# Patient Record
Sex: Female | Born: 1935
Health system: Southern US, Community
[De-identification: ages and names within clinical notes are randomized; demographics above are authoritative.]

## PROBLEM LIST (undated history)

## (undated) DIAGNOSIS — F419 Anxiety disorder, unspecified: Secondary | ICD-10-CM

## (undated) DIAGNOSIS — F32A Depression, unspecified: Secondary | ICD-10-CM

## (undated) DIAGNOSIS — E785 Hyperlipidemia, unspecified: Secondary | ICD-10-CM

## (undated) DIAGNOSIS — N189 Chronic kidney disease, unspecified: Secondary | ICD-10-CM

## (undated) DIAGNOSIS — M810 Age-related osteoporosis without current pathological fracture: Secondary | ICD-10-CM

## (undated) DIAGNOSIS — I10 Essential (primary) hypertension: Secondary | ICD-10-CM

## (undated) DIAGNOSIS — N390 Urinary tract infection, site not specified: Secondary | ICD-10-CM

## (undated) DIAGNOSIS — M1990 Unspecified osteoarthritis, unspecified site: Secondary | ICD-10-CM

## (undated) DIAGNOSIS — K219 Gastro-esophageal reflux disease without esophagitis: Secondary | ICD-10-CM

## (undated) DIAGNOSIS — I639 Cerebral infarction, unspecified: Secondary | ICD-10-CM

## (undated) DIAGNOSIS — F329 Major depressive disorder, single episode, unspecified: Secondary | ICD-10-CM

## (undated) HISTORY — DX: Anxiety disorder, unspecified: F41.9

## (undated) HISTORY — PX: CHOLECYSTECTOMY: SHX55

## (undated) HISTORY — DX: Chronic kidney disease, unspecified: N18.9

## (undated) HISTORY — DX: Age-related osteoporosis without current pathological fracture: M81.0

## (undated) HISTORY — DX: Unspecified osteoarthritis, unspecified site: M19.90

## (undated) HISTORY — DX: Depression, unspecified: F32.A

## (undated) HISTORY — DX: Major depressive disorder, single episode, unspecified: F32.9

---

## 1997-07-29 ENCOUNTER — Ambulatory Visit (HOSPITAL_COMMUNITY): Admission: RE | Admit: 1997-07-29 | Discharge: 1997-07-29 | Payer: Self-pay | Admitting: Ophthalmology

## 1997-08-13 ENCOUNTER — Other Ambulatory Visit: Admission: RE | Admit: 1997-08-13 | Discharge: 1997-08-13 | Payer: Self-pay | Admitting: Internal Medicine

## 2003-04-12 ENCOUNTER — Inpatient Hospital Stay (HOSPITAL_COMMUNITY): Admission: EM | Admit: 2003-04-12 | Discharge: 2003-04-17 | Payer: Self-pay | Admitting: Emergency Medicine

## 2003-04-14 ENCOUNTER — Encounter (INDEPENDENT_AMBULATORY_CARE_PROVIDER_SITE_OTHER): Payer: Self-pay | Admitting: Cardiology

## 2003-04-17 ENCOUNTER — Inpatient Hospital Stay
Admission: RE | Admit: 2003-04-17 | Discharge: 2003-04-29 | Payer: Self-pay | Admitting: Physical Medicine & Rehabilitation

## 2003-05-27 ENCOUNTER — Encounter
Admission: RE | Admit: 2003-05-27 | Discharge: 2003-08-25 | Payer: Self-pay | Admitting: Physical Medicine & Rehabilitation

## 2003-10-24 ENCOUNTER — Emergency Department (HOSPITAL_COMMUNITY): Admission: EM | Admit: 2003-10-24 | Discharge: 2003-10-25 | Payer: Self-pay | Admitting: *Deleted

## 2003-10-26 ENCOUNTER — Inpatient Hospital Stay (HOSPITAL_COMMUNITY): Admission: RE | Admit: 2003-10-26 | Discharge: 2003-10-31 | Payer: Self-pay | Admitting: Family Medicine

## 2004-12-29 ENCOUNTER — Ambulatory Visit (HOSPITAL_COMMUNITY): Admission: RE | Admit: 2004-12-29 | Discharge: 2004-12-29 | Payer: Self-pay | Admitting: Family Medicine

## 2006-01-05 ENCOUNTER — Ambulatory Visit (HOSPITAL_COMMUNITY): Admission: RE | Admit: 2006-01-05 | Discharge: 2006-01-05 | Payer: Self-pay | Admitting: Family Medicine

## 2006-01-23 ENCOUNTER — Ambulatory Visit (HOSPITAL_COMMUNITY): Admission: RE | Admit: 2006-01-23 | Discharge: 2006-01-23 | Payer: Self-pay | Admitting: General Surgery

## 2006-07-08 ENCOUNTER — Emergency Department (HOSPITAL_COMMUNITY): Admission: EM | Admit: 2006-07-08 | Discharge: 2006-07-08 | Payer: Self-pay | Admitting: Emergency Medicine

## 2006-10-17 ENCOUNTER — Emergency Department (HOSPITAL_COMMUNITY): Admission: EM | Admit: 2006-10-17 | Discharge: 2006-10-17 | Payer: Self-pay | Admitting: Emergency Medicine

## 2006-10-19 ENCOUNTER — Inpatient Hospital Stay (HOSPITAL_COMMUNITY): Admission: RE | Admit: 2006-10-19 | Discharge: 2006-10-31 | Payer: Self-pay | Admitting: Gastroenterology

## 2006-10-19 ENCOUNTER — Ambulatory Visit: Payer: Self-pay | Admitting: Urgent Care

## 2006-10-22 ENCOUNTER — Ambulatory Visit: Payer: Self-pay | Admitting: Internal Medicine

## 2006-11-14 ENCOUNTER — Ambulatory Visit (HOSPITAL_COMMUNITY): Admission: RE | Admit: 2006-11-14 | Discharge: 2006-11-14 | Payer: Self-pay | Admitting: Internal Medicine

## 2006-12-25 ENCOUNTER — Ambulatory Visit: Payer: Self-pay | Admitting: Gastroenterology

## 2007-04-12 ENCOUNTER — Ambulatory Visit: Payer: Self-pay | Admitting: Gastroenterology

## 2007-07-14 ENCOUNTER — Inpatient Hospital Stay (HOSPITAL_COMMUNITY): Admission: EM | Admit: 2007-07-14 | Discharge: 2007-07-17 | Payer: Self-pay | Admitting: Emergency Medicine

## 2007-08-02 ENCOUNTER — Inpatient Hospital Stay (HOSPITAL_COMMUNITY): Admission: EM | Admit: 2007-08-02 | Discharge: 2007-08-06 | Payer: Self-pay | Admitting: Emergency Medicine

## 2007-08-15 ENCOUNTER — Ambulatory Visit: Payer: Self-pay | Admitting: Internal Medicine

## 2007-08-26 ENCOUNTER — Ambulatory Visit: Payer: Self-pay | Admitting: Gastroenterology

## 2007-08-26 ENCOUNTER — Ambulatory Visit (HOSPITAL_COMMUNITY): Admission: RE | Admit: 2007-08-26 | Discharge: 2007-08-26 | Payer: Self-pay | Admitting: Gastroenterology

## 2007-08-26 ENCOUNTER — Encounter: Payer: Self-pay | Admitting: Gastroenterology

## 2007-09-04 ENCOUNTER — Ambulatory Visit: Payer: Self-pay | Admitting: Gastroenterology

## 2007-10-02 ENCOUNTER — Inpatient Hospital Stay (HOSPITAL_COMMUNITY): Admission: EM | Admit: 2007-10-02 | Discharge: 2007-10-11 | Payer: Self-pay | Admitting: Emergency Medicine

## 2007-10-03 ENCOUNTER — Ambulatory Visit: Payer: Self-pay | Admitting: Oncology

## 2007-10-04 ENCOUNTER — Ambulatory Visit: Payer: Self-pay | Admitting: Gastroenterology

## 2007-10-05 ENCOUNTER — Ambulatory Visit: Payer: Self-pay | Admitting: Gastroenterology

## 2007-10-07 ENCOUNTER — Ambulatory Visit: Payer: Self-pay | Admitting: Internal Medicine

## 2007-10-09 ENCOUNTER — Ambulatory Visit: Payer: Self-pay | Admitting: Gastroenterology

## 2007-10-18 ENCOUNTER — Ambulatory Visit (HOSPITAL_COMMUNITY): Admission: RE | Admit: 2007-10-18 | Discharge: 2007-10-18 | Payer: Self-pay | Admitting: Family Medicine

## 2007-11-20 ENCOUNTER — Ambulatory Visit: Payer: Self-pay | Admitting: Gastroenterology

## 2008-03-03 ENCOUNTER — Ambulatory Visit: Payer: Self-pay | Admitting: Gastroenterology

## 2008-03-03 LAB — CONVERTED CEMR LAB
ALT: 24 units/L (ref 0–35)
AST: 26 units/L (ref 0–37)
Albumin: 4.1 g/dL (ref 3.5–5.2)
Alkaline Phosphatase: 89 units/L (ref 39–117)
Bilirubin, Direct: 0.1 mg/dL (ref 0.0–0.3)
Ferritin: 220 ng/mL (ref 10–291)
HCT: 45.4 % (ref 36.0–46.0)
Hemoglobin: 14 g/dL (ref 12.0–15.0)
Indirect Bilirubin: 0.3 mg/dL (ref 0.0–0.9)
Total Bilirubin: 0.4 mg/dL (ref 0.3–1.2)
Total Protein: 7.5 g/dL (ref 6.0–8.3)

## 2008-06-29 IMAGING — CR DG CHEST 2V
2 series · 2 of 2 positions shown · non-contrast
Comparison: 10/21/2006.

Exam: Chest, 2 views.

HISTORY: Acute renal failure and pneumonia.

[view not recorded (1 of 2)]
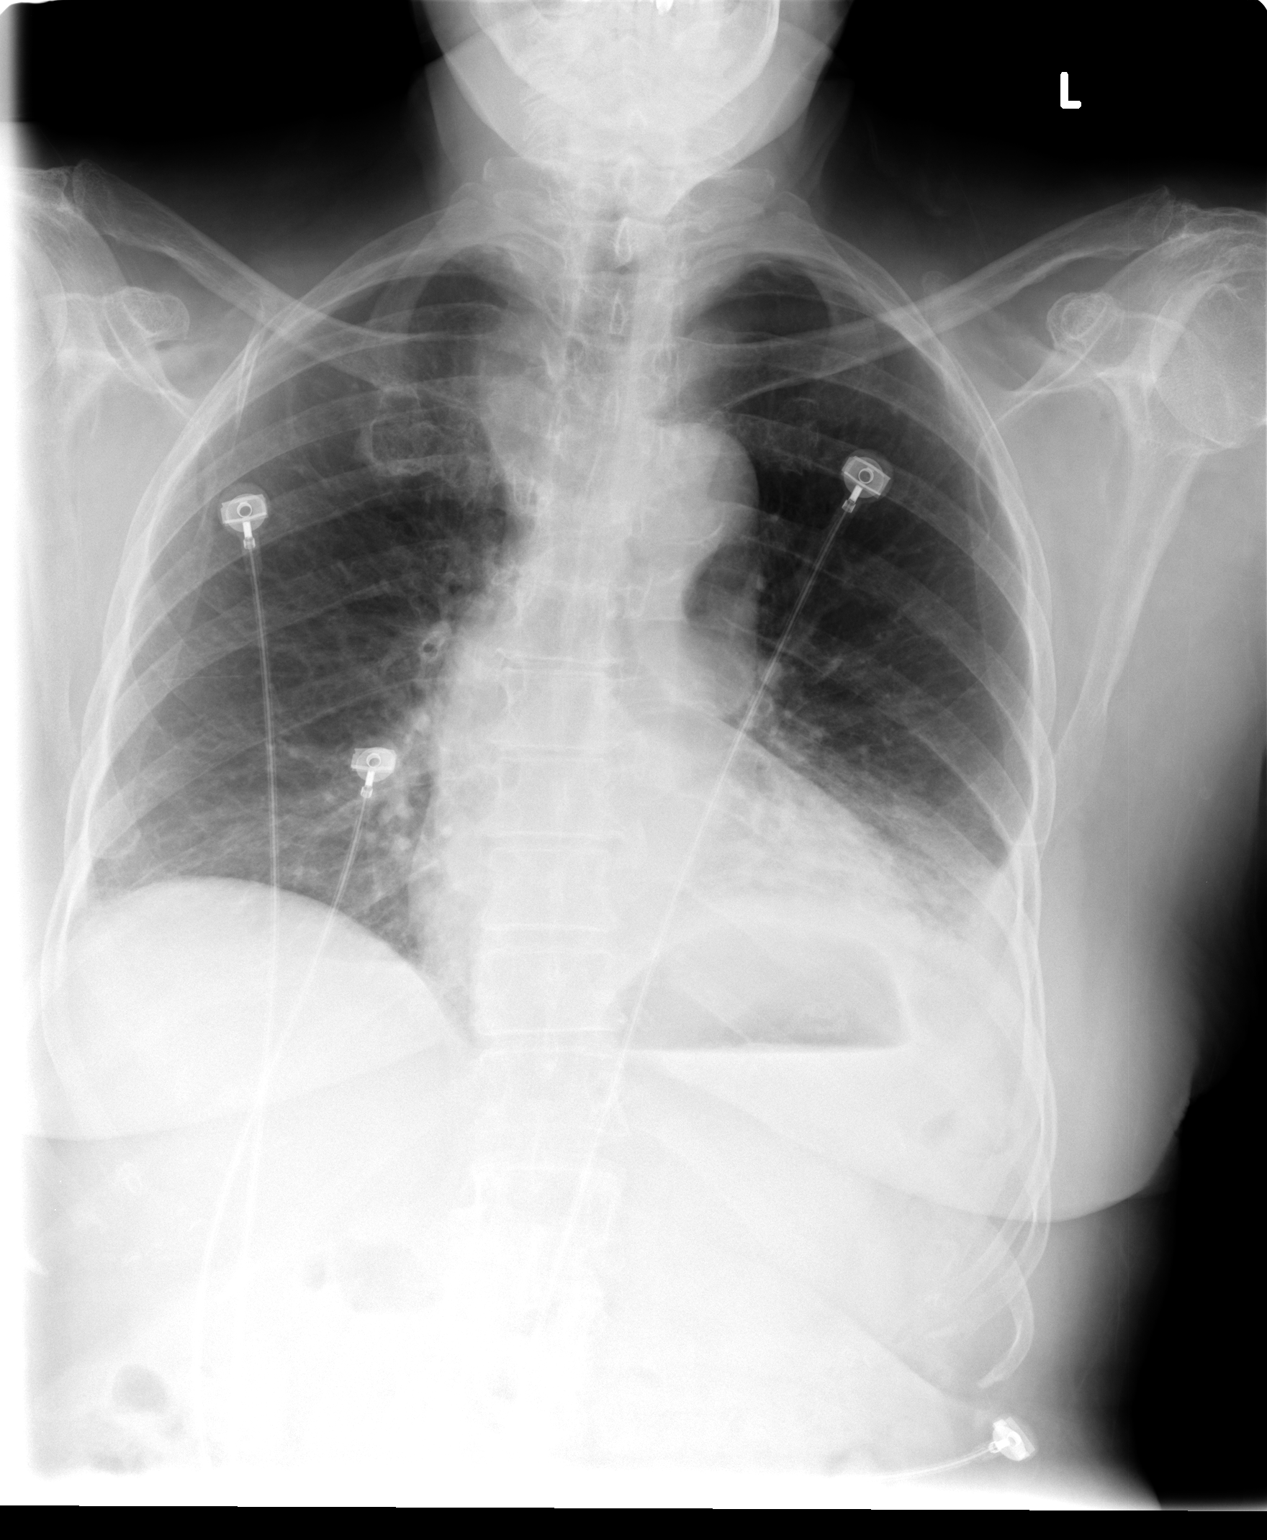

[view not recorded (2 of 2)]
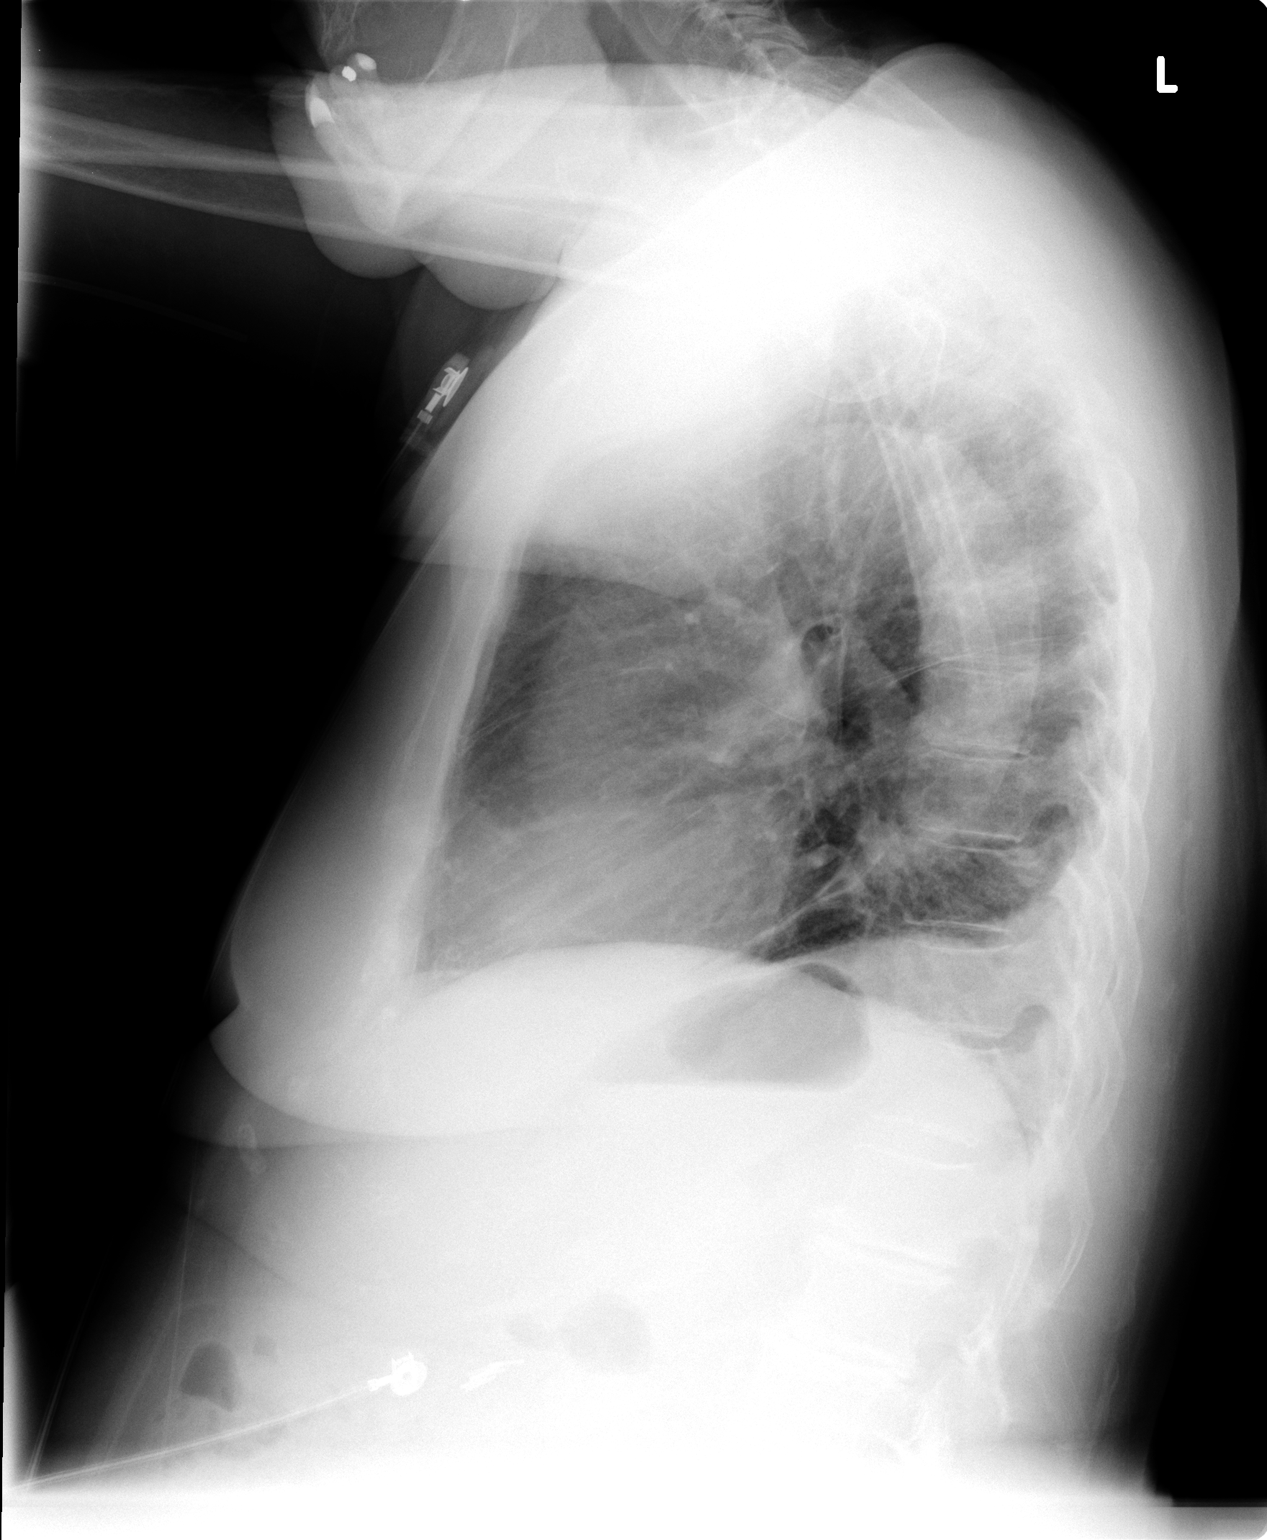

[2 of 2 positions shown; findings below may reference images not displayed]

FINDINGS: Heart size is mildly enlarged. 

There is a small left pleural effusion, unchanged from prior exam.

Left lower lobe airspace disease is similar to prior exam.
IMPRESSION: 1. No change in left lower lobe air space disease and subpulmonic effusion.

## 2008-10-23 DIAGNOSIS — Z862 Personal history of diseases of the blood and blood-forming organs and certain disorders involving the immune mechanism: Secondary | ICD-10-CM | POA: Insufficient documentation

## 2008-10-23 DIAGNOSIS — N189 Chronic kidney disease, unspecified: Secondary | ICD-10-CM | POA: Insufficient documentation

## 2008-10-23 DIAGNOSIS — K869 Disease of pancreas, unspecified: Secondary | ICD-10-CM | POA: Insufficient documentation

## 2008-10-23 DIAGNOSIS — D649 Anemia, unspecified: Secondary | ICD-10-CM | POA: Insufficient documentation

## 2008-10-23 DIAGNOSIS — I10 Essential (primary) hypertension: Secondary | ICD-10-CM | POA: Insufficient documentation

## 2008-10-23 DIAGNOSIS — R809 Proteinuria, unspecified: Secondary | ICD-10-CM | POA: Insufficient documentation

## 2008-10-23 DIAGNOSIS — E213 Hyperparathyroidism, unspecified: Secondary | ICD-10-CM | POA: Insufficient documentation

## 2008-10-23 DIAGNOSIS — Z8639 Personal history of other endocrine, nutritional and metabolic disease: Secondary | ICD-10-CM | POA: Insufficient documentation

## 2008-10-23 DIAGNOSIS — M1990 Unspecified osteoarthritis, unspecified site: Secondary | ICD-10-CM | POA: Insufficient documentation

## 2009-06-11 IMAGING — CR DG HIP COMPLETE 2+V*R*
3 series · 3 of 3 positions shown · non-contrast
Comparison: None

CLINICAL DATA: Pain, weakness

RIGHT HIP - COMPLETE 2+ VIEW

[view not recorded (1 of 3)]
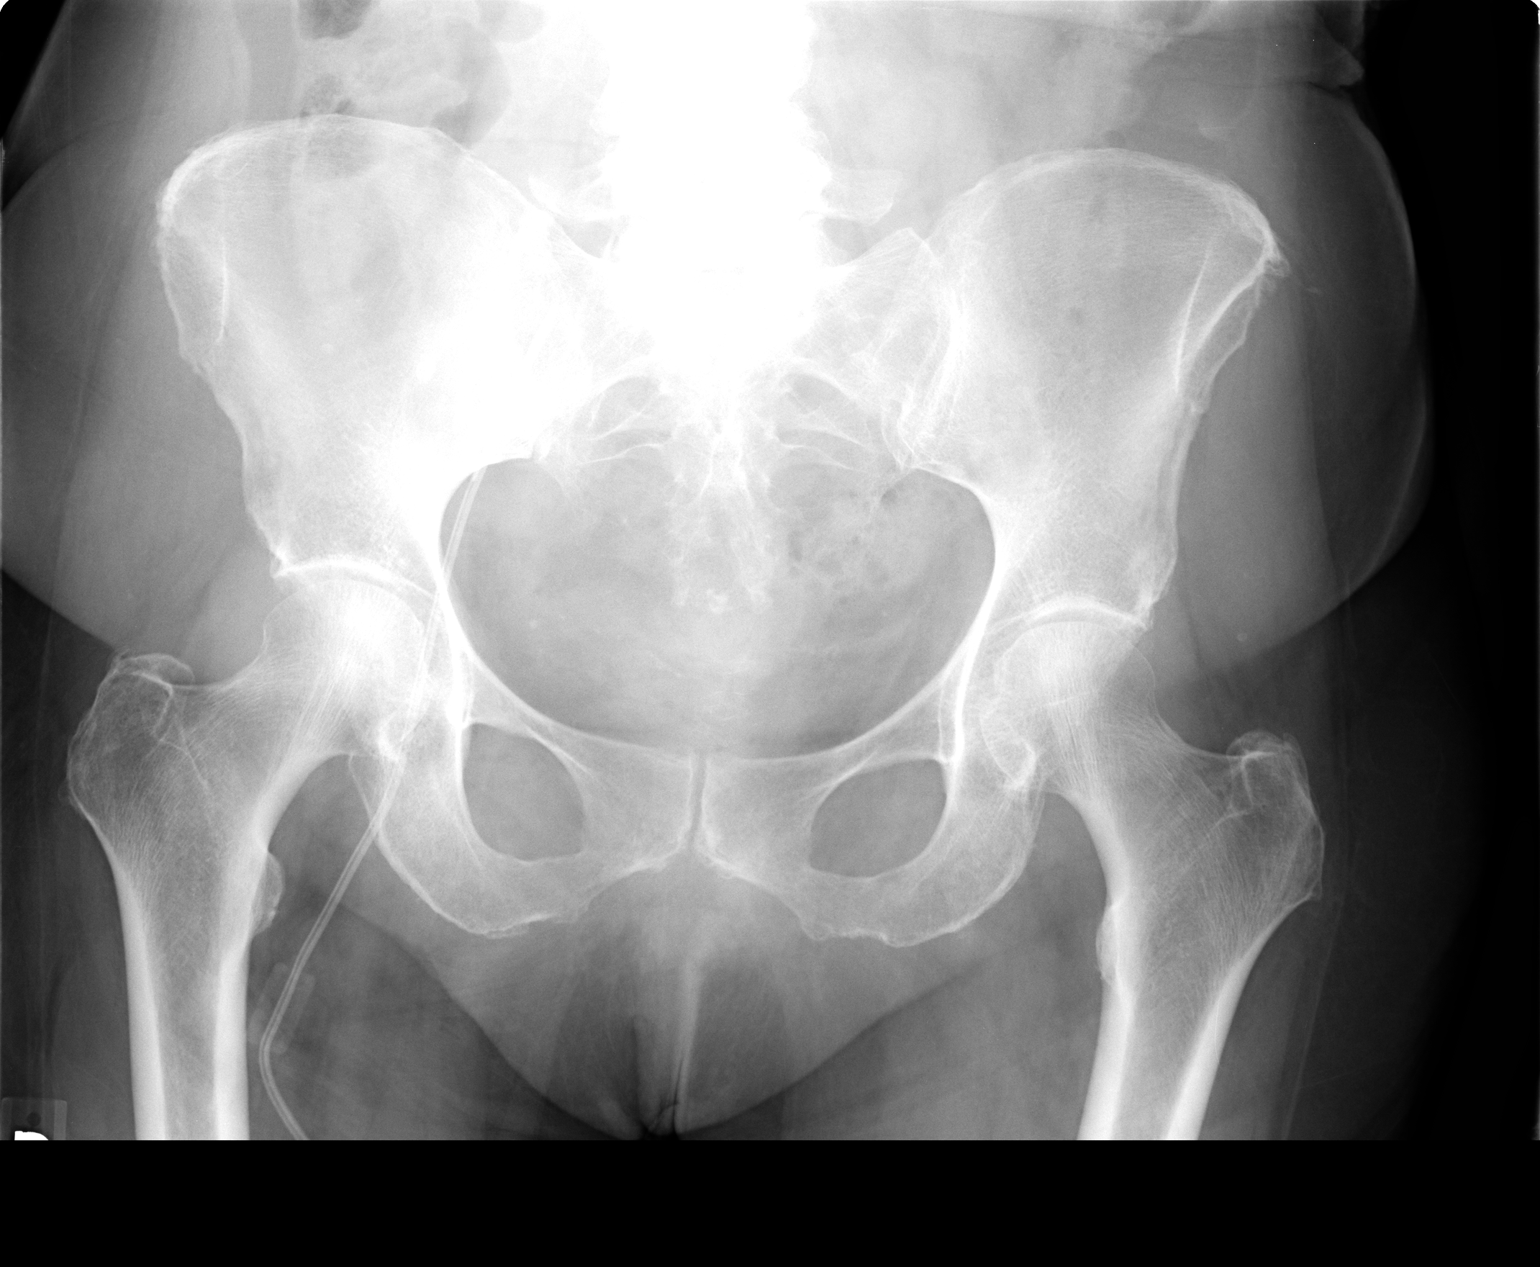

[view not recorded (2 of 3)]
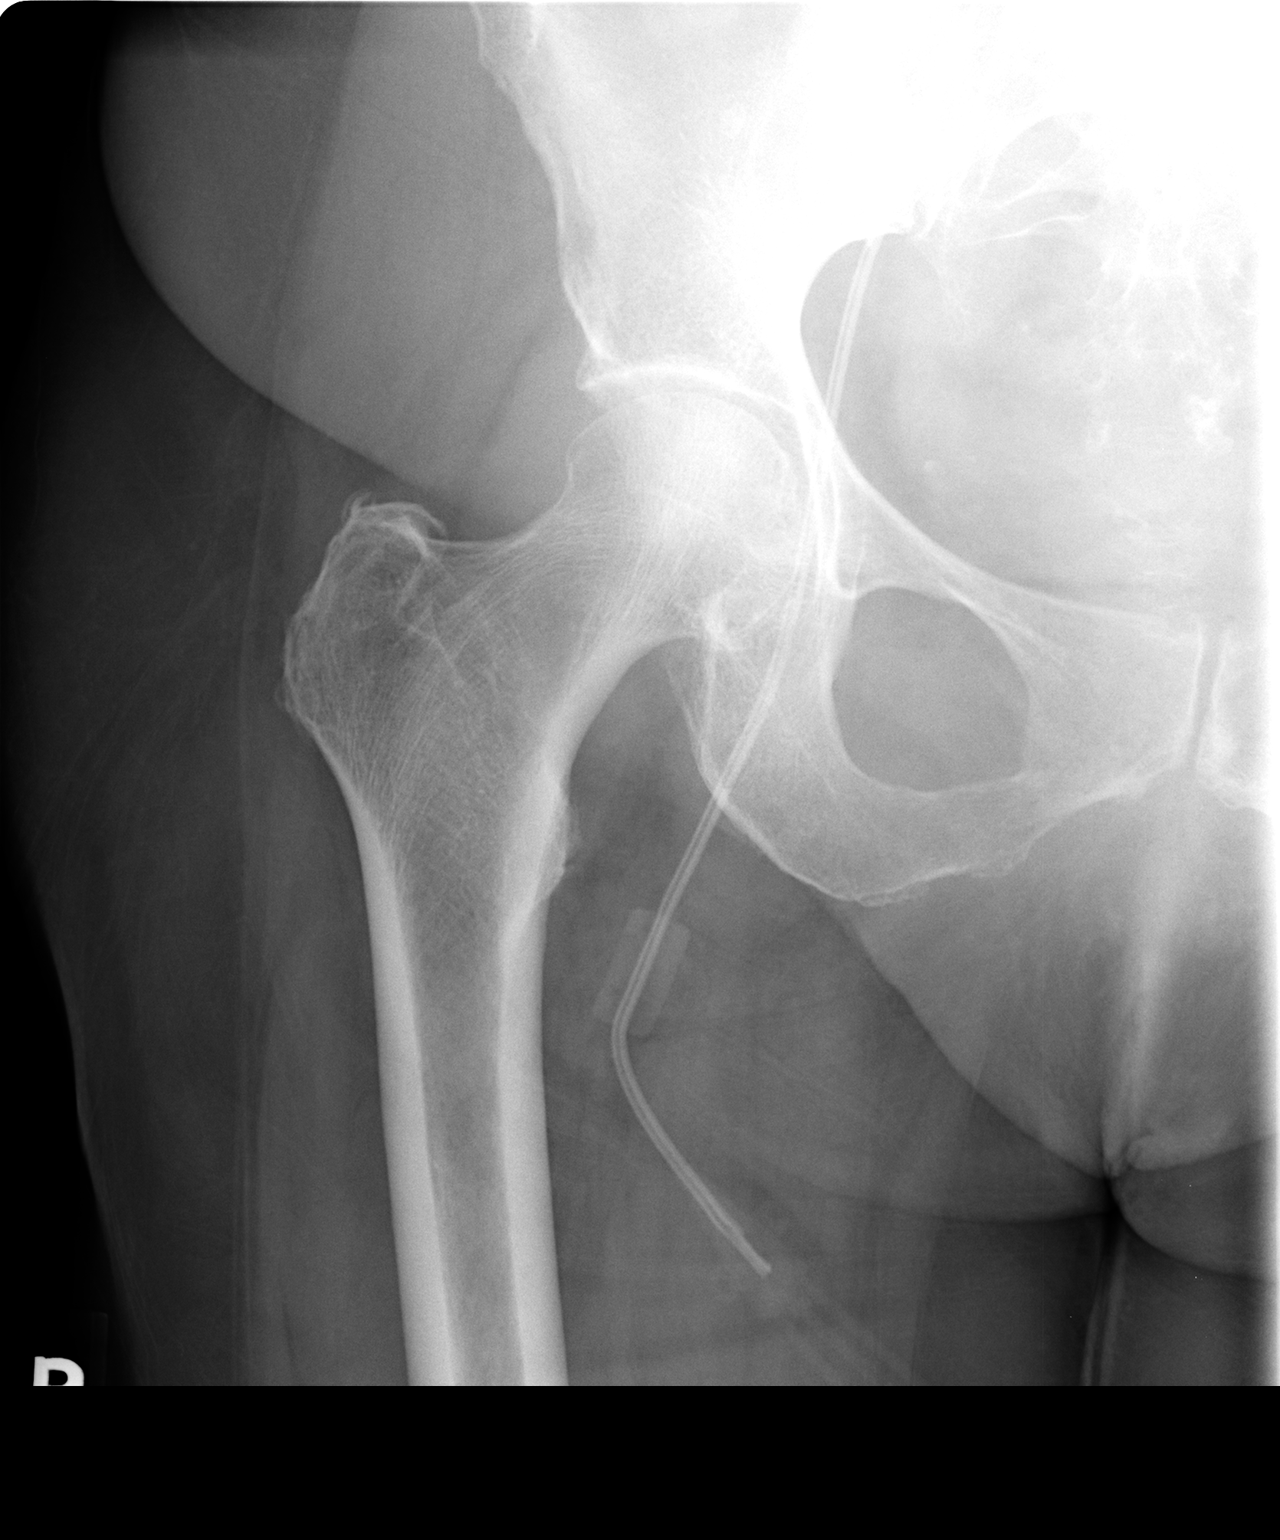

[view not recorded (3 of 3)]
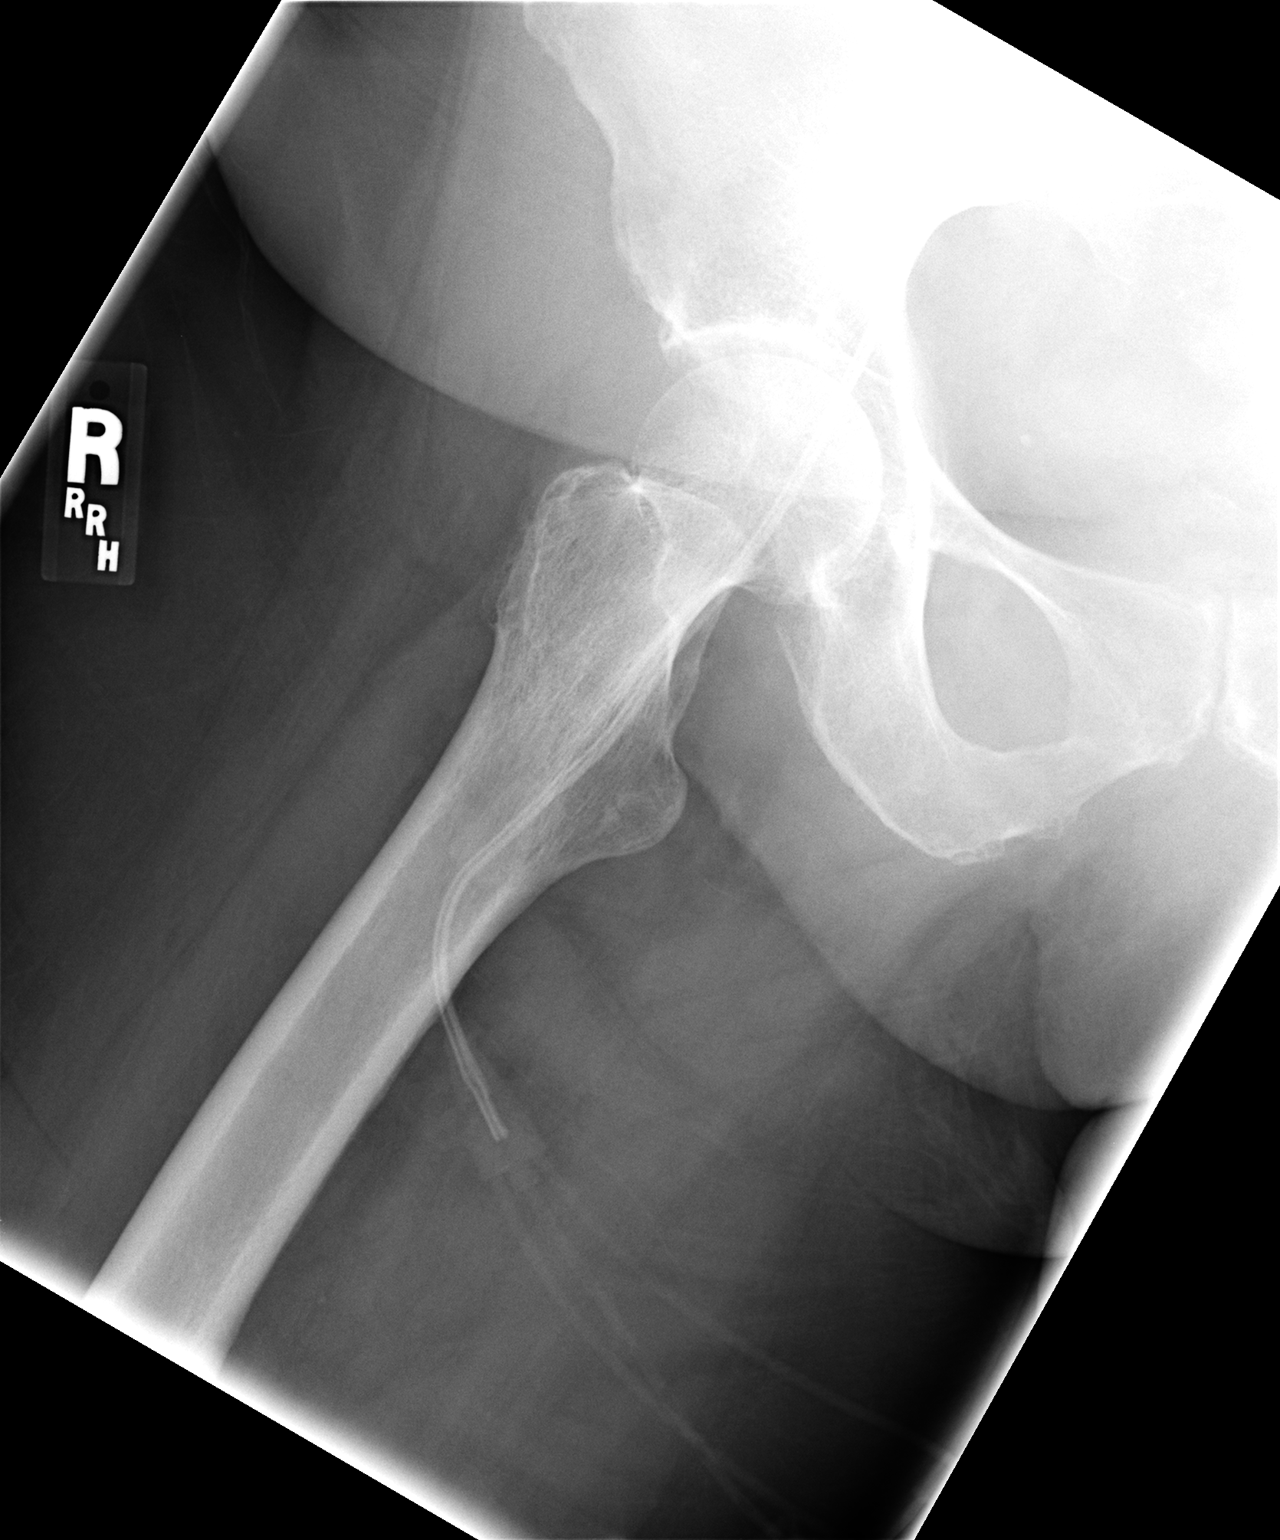

[3 of 3 positions shown; findings below may reference images not displayed]

FINDINGS: Three views of the right hip shows no acute fracture or
subluxation.  Mild degenerative narrowing of superior joint space
noted.  A right femoral/iliac venous catheter is noted.
IMPRESSION: No acute fracture or subluxation.  Mild degenerative narrowing of
superior joint space.

## 2010-08-09 NOTE — Group Therapy Note (Signed)
[name], [name]             ACCOUNT NO.:  [id number]   MEDICAL RECORD NO.:  [id number]          PATIENT TYPE:  INP   LOCATION:  A214                          FACILITY:  [facility]   PHYSICIAN:  Girard Cooter, MD         DATE OF BIRTH:  06-05-1930   DATE OF PROCEDURE:  08/05/2007  DATE OF DISCHARGE:                                 PROGRESS NOTE   Patient examined by bedside today.  She still states that she has no  appetite.  She denies any nausea or vomiting.  I spoke to her daughter  today as well.  It has also been our concern that patient has not been  eating as well.   PHYSICAL EXAMINATION:  VITAL SIGNS:  Temperature 90.4, pulse 82,  respirations 18, blood pressure 121/67, pulse ox 97% on room air.  HEENT:  Normocephalic and atraumatic.  Mucous membranes are moist.  CARDIOVASCULAR:  S1-S2.  Regular rate and rhythm without murmur.  ABDOMEN:  Soft, nontender, nondistended.  Positive bowel sounds.  LUNGS:  Clear to auscultation bilaterally.  No rhonchi, rales or  wheezes.   LABORATORY DATA:  She shows much improved creatinine of 2.20.  Note that  her baseline creatinine is 2.01.  Her albumin is low at 2.4.  AST, ALT,  alk phos within normal limits.  CBC with white count 9.1, hemoglobin low  at 9.4, hematocrit 26.7, platelets is 303.  CBC yesterday shows a  hemoglobin of 10.5, hematocrit 30.3.  MCV is normal at 92.6.  Her anemia  panel shows a retic count of 65.8 within normal limits, iron level low  at 25, TIBC 25.2, vitamin B12 okay.  Alpha-fetoprotein less than 1.3.  CEA 1.  Protein electrophoresis 24.  An interpretation shows results 7.3  within normal limits.   ASSESSMENT/PLAN:  1. Acute on chronic renal failure and nongap metabolic acidosis      resolving slowly, unclear etiology could be secondary to diuretics.      I suspect that there is an underlying reason the patient is also      anemic.  2. Iron-deficient normocytic anemia secondary to chronic disease of      the  disease.  He will go ahead and get stool for occult blood in      case there is a mixed picture.  We will supplement her with iron.      Note that her urinalysis also shows a small amount of blood.      Bladder malignancy is also in the differential diagnosis.  3. Loss of appetite and hypoalbuminemia and protein calorie      malnutrition secondary to unclear etiology.  The patient states      that she just has a decreased appetite and no abdominal symptoms.      Will continue dietary therapy, continue to follow up plans per      nephrology.  4. Urinary tract infection per labs.  We will wait cultures continue      Levaquin.  5. History of depression.  This could obviously also contribute to her  decreased p.o. intake.  We will go ahead and get an ACT team      consult.  6. Continue Lexapro.  Go ahead and increase Lexapro to 10 mg p.o.      daily.   DISPOSITION:  The patient's care is currently ongoing.  Rest of the  plans dependent on her progress.  Nephrology recommendations greatly  appreciated.      Girard Cooter, MD  Electronically Signed     RR/MEDQ  D:  08/05/2007  T:  08/05/2007  Job:  [id number]

## 2010-08-09 NOTE — Group Therapy Note (Signed)
[name], [name]             ACCOUNT NO.:  [id number]   MEDICAL RECORD NO.:  [id number]          PATIENT TYPE:  INP   LOCATION:  A204                          FACILITY:  [facility]   PHYSICIAN:  Anselmo Pickler, DO    DATE OF BIRTH:  06-Feb-1931   DATE OF PROCEDURE:  DATE OF DISCHARGE:                                 PROGRESS NOTE   HISTORY OF PRESENT ILLNESS:  The patient is a 77 year old African-  American female who was seen here regarding renal failure of unknown  etiology. Currently the patient yesterday was complaining of not being  able to swallow. Does not have any complaints of that currently today.  However, a swallow study has been ordered and await the results from  that. Basically, I explained to the patient that we were waiting for her  kidney function to improve and will continue to monitor with that as  well. The patient was also seen by PT/OT at this point in time. She is  currently still on bed rest.   OBJECTIVE:  VITAL SIGNS: Her vitals are as follows: Her temperature is  97.7, pulse 83, respirations 20, blood pressure 96/47.  GENERAL APPEARANCE: Generally, this is a 77 year old African-American  female who is well developed, well nourished and in no acute distress.  HEART: Regular rate and rhythm, S1, S2 noted. No murmurs.  LUNGS: The patient has rales in the right base as well as some wheezing  with inspiration.  ABDOMEN: Soft, nontender, nondistended. Bowel sounds are present. No  masses or organomegaly appreciated.  EXTREMITIES: Positive pulses in all 4 extremities. No edema, cyanosis or  clubbing noted.   LABORATORY DATA:  Her labs that were done today include a CBC with a  white count of 8.4, hemoglobin 10.0, hematocrit 30.1 and platelets of  576. Also she had a chemistry done with a sodium of 135, potassium 3.5,  chloride 98, CO2 21, glucose 99, BUN 82 which has increased from  yesterday at 81 and creatinine of 9.56 which is decreased from 7/29, at  11.58. Phosphorus is 7.6, calcium is 7.8 and blood albumin is 2.5.   ASSESSMENT AND PLAN:  1. Acute renal failure of unclear etiology. The patient is improving.      Will go ahead and just monitor the patient along with nephrology.  2. Anemia from chronic disease. Will continue to monitor the patient      at this point in time.  3. Pneumonia. The patient is on Levaquin. This is day 4. Will continue      this therapy and will discontinue after 5 days.  4. Hypertension which is currently stable. Will still hold antitensive      medications at this point in time until her blood pressure actually      increased.  5. Deep venous thrombosis and gastrointestinal prophylaxis will be      continued.      Anselmo Pickler, DO  Electronically Signed     CB/MEDQ  D:  10/24/2006  T:  10/25/2006  Job:  [id number]

## 2010-08-09 NOTE — Consult Note (Signed)
NAMESADIAH, [name]             ACCOUNT NO.:  [id number]   MEDICAL RECORD NO.:  [id number]          PATIENT TYPE:  INP   LOCATION:  A313                          FACILITY:  [facility]   PHYSICIAN:  Caro Hight, [name]      DATE OF BIRTH:  Oct 09, 1930   DATE OF CONSULTATION:  DATE OF DISCHARGE:                                 CONSULTATION   REQUESTING PHYSICIAN:  Dr. Caron Presume   CHIEF COMPLAINT:  Unable to eat, nausea and vomiting, abdominal  discomfort.   HISTORY OF PRESENT ILLNESS:  [name] is a 77 year old female with  47-month history of being unable to eat.  She states she has severe  nausea.  She has vomited on at least four occasions.  She has lost 10  pounds.  She complains of an intermittent mid abdominal pain.  It  usually lasts all night long per her report.  She is having a daily  bowel movement.  Denies any rectal bleeding or melena.  She denies any  heart burn or indigestion although she has been gagging, especially with  her pills.  Denies any problems with solid or liquid dysphagia  otherwise.  She did have an EKG through Dr. [name] office which was  negative.  She also has been found to be anemic with a hemoglobin of 10,  a hematocrit of 32.3, an MCV of 91.  She had a ferritin of 391, an iron  of 19, and a percent saturation of 7.  She had a normal amylase and  lipase and a negative H. pylori.  White blood cell count was 8.  She had  a normal TSH on October 09, 2006.  She had a comprehensive metabolic panel  on October 09, 2006, and had a normal CMP except an albumin of 3.4.   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  She has a history of  hypertension, depression.  Diverticulosis was found on a colonoscopy by  Dr. Arnoldo Morale, they believe within the last year.  She is status post  gallstone pancreatitis and cholecystectomy in 2005.   CURRENT MEDICATIONS:  1. Aggrenox b.i.d.  2. Lexapro 5 mg  daily.  3. Lisinopril/hydrochlorothiazide 10/12.5 mg b.i.d.  4. TriCor 48 mg daily.  5. AcipHex 20 mg daily.  6. Centrum multivitamin daily.  7. Phenergan 12.5 mg p.r.n.  8. Tylenol p.r.n.   ALLERGIES:  BENADRYL.   FAMILY HISTORY:  Mother and father have cancer but of unknown origin.   SOCIAL HISTORY:  [name] is a widow.  She is retired.  She denies  any tobacco, alcohol or drug use.   REVIEW OF SYSTEMS:  See HPI; otherwise, negative.   PHYSICAL EXAMINATION:  VITAL SIGNS:  Weight 149 pounds, height 65  inches, temperature 98.8, blood pressure 110/70 and pulse 80.  GENERAL:  [name] is an ill-appearing African American female who  is alert, oriented, pleasant and cooperative.  She does appear pale and  weak.  HEENT:  Sclerae clear, nonicteric.  Conjunctivae pink.  Oropharynx dry.  NECK:  Supple without any mass or thyromegaly.  CHEST:  Heart regular rate and rhythm.  Normal S1, S2, without any  murmur, clicks, rubs or gallops.  LUNGS:  Clear to auscultation bilaterally.  ABDOMEN:  Positive bowel sounds x4, no bruits auscultated.  Abdomen is  slightly distended.  She does have significant tenderness with palpation  at the umbilicus and entire right abdomen.  There is no rebound  tenderness or guarding.  EXTREMITIES:  Without clubbing or edema bilaterally.  RECTAL:  No external lesions visualized, good sphincter tone, no  internal masses palpated.  A small amount of light brown stool obtained  from the vault which is hemoccult negative.   IMPRESSION:  [name] is a 77 year old female with a 60-month  history of anorexia, nausea, vomiting, weight loss, as well as  intermittent mid abdominal pain.  She is found to be anemic with last  hemoglobin with a last hemoglobin of 10.  She is quite tender on exam,  especially at the umbilicus and right side of her abdomen.  Would be  concerned with acute appendicitis.  Diverticulitis is not out of the  differential as  well.  She has had a recent colonoscopy per her report  but we do not have the report.  Given her history of anorexia, nausea  and vomiting, would also be concerned with peptic ulcer disease.  Of  course, she is elderly and we need to rule out urinary tract infection  as well.   PLAN:  1. STAT CBC and CMP.  2. STAT CT of abdomen and pelvis with IV and oral contrast.  3. If abdominopelvic CT is normal she is going to need EGD as soon as      possible.  I have discussed this with the patient and her daughter      and both agree with this plan.  I have discussed risks and benefits      which include but are not limited to bleeding, infection,      perforation, drug reaction, and consent will be obtained.  4. I have discussed this case with Dr. Stann Mainland who is going to follow up      on her CT and labs in my absence and set up EGD if CT is benign.   Thank you, Dr. Caron Presume, for allowing [place] to participate in the care of  [name].      Vickey Huger, N.P.      Caro Hight, [name]  Electronically Signed   KJ/MEDQ  D:  10/19/2006  T:  10/19/2006  Job:  [id number]   cc:   [name], [name]  Fax: [phone]

## 2010-08-09 NOTE — Assessment & Plan Note (Signed)
[name], [name]              CHART#:  [id number]   DATE:  04/12/2007                       DOB:  1930-10-11   REFERRING PHYSICIAN:  [name].   PROBLEM LIST:  1. Pan-colonic diverticulosis.  2. Nausea, vomiting, and abdominal pain, likely secondary to renal      failure in July 2008.  3. Cerebrovascular disease.  4. Hypertension.  5. Depression.  6. History of gallstone pancreatitis and cholecystectomy in 2005.   SUBJECTIVE:  The patient is a 77 year old female who presents as a  return patient visit.  Her last colonoscopy was in 2007 by Dr. Aviva Signs.  She had pancolonic diverticulosis.  She has no questions,  concerns, or complaints today.  She denies any nausea, vomiting,  abdominal pain, diarrhea, rectal bleeding, heartburn or indigestion.  Her appetite is good.  She has 1 to 2 bowel movements a day.   OBJECTIVE:  Weight 144 pounds (down 5 pounds since July of 2008), height  5 feet 3 inches, temp 98.4, blood pressure 120/78, pulse 68.  GENERAL:  She is no apparent distress, alert and oriented x4.  Her lungs  are clear to auscultation bilaterally.  Cardiovascular exam is a regular rhythm.ABDOMEN:  Bowel sounds are  present, soft, nontender, nondistended.   ASSESSMENT:  The patient is a 77 year old female who presented with  normocytic anemia in July 2008.  Her last hemoglobin was 9.8 with a  ferritin of 359 in September 2008.  She says she has not had any labs  drawn since.  She likely has anemia or chronic disease.  Thank you for  allowing me to see the patient in consultation.  My recommendations  follow.   RECOMMENDATIONS:  1. Will check CBC and BMP on today.  If she has anemia and her      creatinine has returned to normal, then would consider      esophagogastroduodenoscopy.  Will call her at [phone] with      the results of her labs.  2. She may follow up with me as needed.       Caro Hight, [name]  Electronically Signed     SM/MEDQ  D:   04/12/2007  T:  04/12/2007  Job:  [id number]   cc:   [name], [name]

## 2010-08-09 NOTE — Assessment & Plan Note (Signed)
NAMESHANLEY, HAUSCHILD              CHART#:  [id number]   DATE:  03/03/2008                       DOB:  1930/11/12   REFERRING PHYSICIAN:  [name], MD   PROBLEM LIST:  1. Transient elevation of liver enzymes likely secondary to      medications.  2. Pancolonic diverticulosis.  3. Anemia of chronic disease, most likely secondary to renal      insufficiency.  4. Cerebrovascular disease.  5. Hypertension.  6. Depression.  7. Gallstone pancreatitis with subsequent cholecystectomy in 2005.  8. History of hypothyroidism.  9. History of hyperparathyroidism.  10.Proteinuria.   SUBJECTIVE:  [name] is a 77 year old female who presents as a  return patient visit.  She was last seen in August 2009.  Her last known  labs were in August 2009, which was a complete metabolic panel, which  showed a normal AST, ALT, and creatinine 1.72.  She has no particular  questions, concerns, or complaints today.  She denies any bright red  blood per rectum or black tarry stools.  She has had no nausea or  vomiting.  Her appetite has been good.  She has gained 20 pounds since  May 2009.  She denies any intermittent pain or vomiting after eating.   MEDICATIONS:  1. Aggrenox b.i.d.  2. Centrum.  3. Omeprazole 20 mg daily.  4. Caltrate.  5. Potassium 40 mEq daily.  6. Loratadine as needed.  7. Nasal spray as needed.  8. Sodium bicarb 2 t.i.d.   OBJECTIVE:  VITAL SIGNS:  Weight 155 pounds, height 5 feet 3 inches,  temperature 98, blood pressure 124/80, and pulse 72.  GENERAL:  She is in no apparent distress.  Alert and oriented x4.LUNGS:  Clear to auscultation bilaterally.  CARDIOVASCULAR:  Regular rhythm.ABDOMEN:  Bowel sounds are present.  Soft, nontender, and nondistended.   ASSESSMENT:  [name] is a 77 year old female who presents with  anemia of chronic disease.  She currently does not have any evidence of  active bleeding.  She had a history of elevated liver enzymes and  imaging study showed no evidence of bowel obstruction.  She does have a  history of gallstones.  Thank you for allowing me to see [name]  in consultation.  My recommendations follow.   RECOMMENDATIONS:  1. Will check a hemoglobin, hematocrit, ferritin, and hepatic function      panel on today.  2. Will mail her the results.  3. Outpatient visit in 6 months.       [name], [name]  Electronically Signed     SM/MEDQ  D:  03/03/2008  T:  03/04/2008  Job:  [id number]   cc:   [name], [name]

## 2010-08-09 NOTE — Op Note (Signed)
[name], [name]             ACCOUNT NO.:  [id number]   MEDICAL RECORD NO.:  [id number]          PATIENT TYPE:  AMB   LOCATION:  DAY                           FACILITY:  [facility]   PHYSICIAN:  Caro Hight, [name]      DATE OF BIRTH:  [birth year]   DATE OF PROCEDURE:  08/26/2007  DATE OF DISCHARGE:                                PROCEDURE NOTE   REFERRING PHYSICIAN:  [name], MD   PROCEDURE:  Esophagogastroduodenoscopy with cold forceps biopsy.   INDICATION FOR EXAMINATION:  Ms. Coleson is a 77 year old female, who  had a drop in her hemoglobin from 14 to 9, which was not associated with  any evidence of active bleeding.  She had a colonoscopy for iron  deficiency anemia less than 2 years ago.  Her anemia has been a problem  and has been associated with chronic renal insufficiency with a  creatinine of 2 to 4.  In July 2008, her TIBC was 206 (250 - 470) and  her ferritin was 376, was also associated with a creatinine of 16.34,  with an albumin of 2.3.  Her hemoglobin was 9.9 with an MCV of 87.3.  She has never had microcytic indices.  She most recently had a anemia  panel in May 2009, which showed a normal TIBC and normal ferritin of 321  with an albumin of 2.5.  She also had a 24-hour urine collection, which  showed mild proteinuria.  Her estimated GFR is 27.   FINDINGS:  1. Patent Schatzki's ring.  Otherwise, normal esophagus without      evidence of Barrett's mass, erosions, or ulcerations.  2. Patchy erythema in the antrum and the body without evidence of      erosion or ulceration.  Biopsies obtained via cold forceps and      biopsy to evaluate for H. pylori gastritis.  3. Normal duodenal bulb in second portion of the duodenum.  Biopsies      obtained via cold forceps to evaluate for celiac sprue as an      etiology for anemia and hypoalbuminemia.   DIAGNOSIS:  No obvious source from Ms. Palm's anemia identified. It  is most likely anemia of chronic disease.   RECOMMENDATIONS:  1. Continue Aciphex daily for GI prophylaxis, because she is taking      Aggrenox chronically.  2. Will call with the results of her biopsies.  3. She was given a handout on gastritis.  4. Could consider Epogen due to her normocytic anemia in the light of      her moderate chronic renal insufficiency.   MEDICATIONS:  1. Demerol 50 mg IV.  2. Versed 2 mg IV.   PROCEDURE TECHNIQUE:  Physical exam was performed and an informed  consent was obtained from the patient.  After explaining the benefits,  risks, and alternatives to the procedure, the patient was connected to  monitor and placed in left lateral position.  Continuous oxygen was  provided by nasal cannula and IV medicine administered through an  indwelling cannula.  After administration of sedation, the patient's  esophagus was intubated and  the scope was advanced under direct  visualization to the second portion of the duodenum.  The scope was  removed slowly by carefully examining the color, texture, anatomy, and  integrity of the mucosa on the way out.  The patient was recovered in  endoscopy and discharged home in satisfactory condition.   PATH:  Reactive gastropathy. Nl small bowel.      Caro Hight, [name]  Electronically Signed     SM/MEDQ  D:  08/26/2007  T:  08/27/2007  Job:  [id number]   cc:   [name], [name]  Fax: [phone]   Alison Murray, [name]  Fax: [phone]

## 2010-08-09 NOTE — Consult Note (Signed)
[name], [name]             ACCOUNT NO.:  [id number]   MEDICAL RECORD NO.:  [id number]          PATIENT TYPE:  INP   LOCATION:  A214                          FACILITY:  [facility]   PHYSICIAN:  Alison Murray, M.D.DATE OF BIRTH:  02-13-1931   DATE OF CONSULTATION:  DATE OF DISCHARGE:                                 CONSULTATION   REASON FOR CONSULT:  Worsening renal failure.   HISTORY:  [name] is a 77 year old African American with a past  medical history of hypertension, history of CVA, and a history of  recurrent acute  on chronic renal failure.  Presently came to the  hospital because of elevated BUN and creatinine.  [name] was seen  by me during the last 2 admissions.  Initially, when she came during the  first visit, the patient was found to have a creatinine of 16 and had  highly elevated BUN.  Hence a possibility of ATN versus prerenal  syndrome was entertained, and the patient was at that time managed and  she was discharged after her creatinine came down to 1.6.  She came the  second time and during this time, it was thought to be secondary to ACE  inhibitor and diuretics, those medications were discontinued.  Her  creatinine came to down to 2 and discharged home.  Presently, she was  found to have again elevated BUN and creatinine hence admitted to the  hospital.  The patient's main complaints seem to be poor appetite.  She  does not have any nausea or vomiting.  Overall, she feels weak and  because of that she was not able to eat or drink.  Presently, her  creatinine went up to 4, hence admitted for acute on chronic renal  failure.   PAST MEDICAL HISTORY:  1. She has history of CVA.  2. History of hypothyroidism.  3. History of iron-deficiency anemia.  4. History of depression.  5. History of acute on chronic renal failure.  6. History of diverticulosis.  7. History of degenerative joint disease.  8. History of hypokalemia.   MEDICATIONS:  Her  medications as an outpatient consist of Macrobid, she  is also on multivitamin 1 tablet p.o. daily, Aciphex 20 mg p.o. daily,  Lexapro 40 mg p.o. daily, and Aggrenox 1 tablet p.o. b.i.d.  Even though  previously she was told to stop lisinopril and hydrochlorothiazide, I  feel she seems to be on that but discussing with her daughter, she was  not taking it.  In the hospital, she was started on normal saline at 100  mL per hour.  She is on Ventolin and Atrovent inhaler.  She is also on  aspirin 81 mg p.o. daily.   ALLERGIES:  She is allergic to Cipro, sulfa, and diphenhydramine.   SOCIAL HISTORY:  The patient lives alone.  No history of smoking and no  history of alcohol abuse.   FAMILY HISTORY:  No history of renal failure.   REVIEW OF SYSTEMS:  Her main complaint seems to be poor appetite.  She  does not have any nausea, no vomiting, no diarrhea, no urgency  or  frequency.  She has been on antibiotics for her recurrent UTI.   PHYSICAL EXAMINATION:  GENERAL:  The patient is alert.  She is in no  apparent distress.  HEENT:  No conjunctival pallor.  No icterus.  Oral mucosa seems to be  dry.  NECK:  Supple.  No JVD.  VITAL SIGNS:  Temperature 98.2, pulse of 93, and blood pressure 112/59.  CHEST:  Clear to auscultation.  No rales.  No rhonchi.  No egophony.  HEART:  Regular rate and rhythm.  No murmur.  No S3.  ABDOMEN:  Soft.  Positive bowel sounds.  EXTREMITIES:  No edema.   Since she came, she has a 1100 cc of urine output.  Her white blood cell  count was 11, hemoglobin 10.4, and hematocrit 29.9.  Sodium 138,  potassium 2.8, BUN is 50, creatinine is 4.1, her phosphorus is 5.9, and  her calcium 8.  Other labs at this moment are not available.  During her  last admission, urine protein electrophoresis was done, no M-spike, but  she has some alpha 1 and alpha 2 elevated immunoglobulins.  She has also  serum protein electrophoresis, not sure what the result was.  The serum  IgG was  1000 and IgM was low.  Her PTH was 317 and she has also iron  studies which showed iron saturation 57% and ferritin of 376.   ASSESSMENT:  1. Renal insufficiency, acute on chronic.  Her BUN and creatinine      seems to be increasing.  The patient has poor p.o. intake.  At this      moment, etiology not clear and I feel she may be using diuretics as      her potassium is low.  Since she lives alone, very difficult to      confirm.  Hence probably, I fell she might have a prerenal      component.  Her urine output seems to be reasonably good.  2. Anemia.  Probably a combination of iron deficiency and anemia of      chronic disease.  Hemoglobin and hematocrit is slight low, but      stable.  3. Proteinuria, in the nephrotic range.  No M protein in the urine.      The serum also looks good.  Probably at this moment, we made repeat      serum, and because of her poor appetite may need to discuss also      with Oncology.  4. History of hyperparathyroidism.  Probably related to chronic renal      failure.  5. History of hypokalemia, etiology not clear.  Since the patient has      been on hydrochlorothiazide and lisinopril, and probably she may be      taking it.  She does not have any diarrhea.  She does not have any      nausea.  No explanation for the low potassium except diuretics at      this moment.  6. History of hypothyroidism.  7. History of recurrent urinary tract infection.  8. History of depression.  9. History of poor appetite, not sure whether this is related to her      depression.   RECOMMENDATIONS:  We will increase her IV fluid to 125 mL per hour.  We  will check her BMET to make sure that the patient has received  potassium; if not, we may need to her potassium into her IV fluid.  We  will continue with other medications and we will start her also on iron  supplement and we will also repeat serum immunoelectrophoresis.  We will  start her also on 1,25 vitamin D  Rocaltrol.      Alison Murray, [name]  Electronically Signed     BB/MEDQ  D:  08/03/2007  T:  08/03/2007  Job:  [id number]

## 2010-08-09 NOTE — H&P (Signed)
[name], [name]             ACCOUNT NO.:  [id number]   MEDICAL RECORD NO.:  [id number]          PATIENT TYPE:  EMS   LOCATION:  ED                            FACILITY:  [facility]   PHYSICIAN:  Salem Caster, DO    DATE OF BIRTH:  Aug 25, 1930   DATE OF ADMISSION:  10/02/2007  DATE OF DISCHARGE:  LH                              HISTORY & PHYSICAL   PRIMARY CARE PHYSICIAN:  [name], [name]   CHIEF COMPLAINT:  Weakness.   HISTORY OF PRESENT ILLNESS:  This is a 77 year old African American  female who presents with a 5-6 day history of fatigue.  Patient states  that over the past 5-6 days, she has been feeling very weak and has also  had some decreased appetite.  Patient has been seen fairly recently by  nephrology as an outpatient for renal dysfunction and anemia.  The  patient has also been having some incontinence on a daily basis.  This  seems to be recurrent.  Today with her concurrent weakness and  incontinence, they decided to bring the patient in to be evaluated.  Patient has been unable to ambulate on her own because of her weakness.   On exam, patient is fatigued.  Denies any major pain other than  bilateral leg achiness but denies any chest pain, abdominal pain,  nausea, vomiting, or diarrhea.   PAST MEDICAL HISTORY:  1. Hypertension.  2. Anemia.  3. Diverticulitis.  4. Renal disease.  5. History of anemia.  6. Diverticulitis.  7. History of CVA.   PAST SURGICAL HISTORY:  Positive for cholecystectomy.   SOCIAL HISTORY:  No history of alcohol, illicit drug use, or tobacco  abuse.  Patient does live at home.   ALLERGIES:  1. BENADRYL.  2. CIPRO.  3. SEPTRA.   CURRENT HOME MEDICATIONS:  1. Aggrenox 1 tab p.o. daily.  2. Metoprolol, unknown dose.  3. Omeprazole daily.   Patient does not know any of her other medications at this time.   REVIEW OF SYSTEMS:  CONSTITUTIONAL:  Positive for decreased appetite,  fatigue.  No fevers or chills.  HEENT:   Unremarkable.  CARDIOVASCULAR:  No chest pain, palpitations.  RESPIRATORY:  Positive for slight cough.  No dyspnea or wheezing.  GASTROINTESTINAL:  No diarrhea, abdominal pain, bloody stools.  GENITOURINARY:  Positive for incontinence.  No dysuria or urgency.  MUSCULOSKELETAL:  Positive for some leg achiness.  No back pain or neck  pain.  SKIN:  Unremarkable.  NEUROLOGIC:  Slight confusion.  Other systems are negative.   PHYSICAL EXAMINATION:  VITAL SIGNS:  Temperature 97.8, respiratory rate  20, pulse 77, blood pressure 131/53.  She is satting 100%.  CONSTITUTIONAL:  She is well-developed, well-nourished, well-hydrated in  no acute distress.  HEENT:  Head is atraumatic and normocephalic.  Eyes are PERRL, EOMI.  She has arcus senilis noted.  No scleral icterus.  Oral mucosa is  slightly dry.  NECK:  Soft, supple.  Nontender, nondistended.  No thyromegaly, no  lymphadenopathy.  CARDIOVASCULAR:  Regular rate and rhythm.  No murmurs, rubs or gallops.  LUNGS:  Clear  to auscultation bilaterally.  No rales, rhonchi or  wheezes.  ABDOMEN:  Soft.  Slight suprapubic tenderness.  Nondistended.  Normal  bowel sounds.  No rigidity or guarding.  EXTREMITIES:  No clubbing, cyanosis or edema.  NEUROLOGIC:  Cranial nerves II-XII are grossly intact.  Patient is alert  and oriented x3.  Patient does look slightly fatigued and slow in  answering questions.   LABS:  ABG showed a pH of 7.205, pCO2 17.5, pO2 105, bicarb 6.7.  Microscopic urine showed few bacteria.  WBCs too-numerous-to-count.  RBCs 3-6.  Urinalysis was leukocyte positive.  No nitrites.  A moderate  amount of blood.  Sodium 139, potassium 3.6, chloride 121, CO2 8,  glucose 124, BUN 68, creatinine 4.73, calcium 9.7.  White count 11.1,  hemoglobin 9.9, hematocrit 30.8, platelet count 274.   Chest x-ray showed basilar fibrosis but more with density than seen  previously.  Superimposed basilar pneumonia suspected.   ASSESSMENT:  1.  Fatigue.  2. Renal insufficiency, acute-on-chronic.  3. Urinary tract infection.  4. Anemia.  5. History of cerebrovascular accident.  6. History of diverticulitis.   PLAN:  1. Patient will be admitted to a general medical bed.  2. For weakness and fatigue, we will get a PT/OT consult at this time.      It could be secondary to her renal insufficiency on top of her      dehydration.  3. For her renal insufficiency, this is probable acute-on-chronic.      Patient has seen nephrology in the past for renal problems.  Will      IV hydrate patient at this time and get a nephrology consult.  Will      hold all ACE inhibitors, any renal-toxic medications at this time.  4. For urinary tract infection, patient has been given IV antibiotics      in the emergency room.  We will continue her      on IV antibiotics daily and get a UA C&S.  5. Anemia:  This is chronic in nature.  We will continue to follow      patient's hemoglobin and hematocrit.  6. Patient will be on DVT as well as GI prophylaxis.      Salem Caster, DO  Electronically Signed     SM/MEDQ  D:  10/02/2007  T:  10/02/2007  Job:  [id number]   cc:   [name], [name]  Fax: [phone]

## 2010-08-09 NOTE — Group Therapy Note (Signed)
NAMETRENA, BLAMER             ACCOUNT NO.:  [id number]   MEDICAL RECORD NO.:  [id number]          PATIENT TYPE:  INP   LOCATION:  A204                          FACILITY:  [facility]   PHYSICIAN:  Anselmo Pickler, DO    DATE OF BIRTH:  [birth year]   DATE OF PROCEDURE:  DATE OF DISCHARGE:                                 PROGRESS NOTE   Patient was seen today.  I spoke with daughter on the phone regarding  patient care.  Also, talked to her regarding nephrology's recommendation  that patient will probably be here over the weekend due to current  kidney function, but patient states she is doing well.   PHYSICAL EXAMINATION:  CURRENT VITAL SIGNS:  Temperature:  98.7.  Pulse:  75.  Respirations:  16.  Blood pressure:  106/59.  GENERAL:  This is a 77 year old African-American female who was admitted  for acute renal failure.  Generally, she is well-nourished, well-  developed, in no acute distress.  She is appropriate and pleasant.  HEART:  Regular rate and rhythm.  No murmurs noted or gallops or rubs.  S1 and S2 present.  LUNGS:  Clear to auscultation bilaterally.  No rales, wheezes or  rhonchi.  ABDOMEN:  Soft, nontender and nondistended.  EXTREMITIES:  Positive +1 pitting edema.  Peripheral pulses palpated x  4.   LABORATORY DATA:  Laboratory work that was done today includes a blood  chemistry, sodium 140, potassium 3.2, chloride 106, CO2 26, glucose 87,  BUN 77, creatinine 7.61 which is decreased from yesterday which was  9.56.   ASSESSMENT/PLAN:  1. Acute renal failure.  We will continue with current therapy laid      out by nephrology.  2. Anemia of chronic disease.  We will continue to monitor patient and      do a complete blood count in the morning to monitor her hemoglobin      and hematocrit.  3. Pneumonia.  Patient is on Levaquin.  This is day 5.  We will      discontinue  therapy today.  4. Patient is hypokalemic.  We will add Kay Ciel to patient's regimen      x  1.      Anselmo Pickler, DO  Electronically Signed     CB/MEDQ  D:  10/25/2006  T:  10/26/2006  Job:  [id number]

## 2010-08-09 NOTE — Assessment & Plan Note (Signed)
NAMEEVALYSE, TACKITT              CHART#:  [id number]   DATE:  08/15/2007                       DOB:  [birth year]   PRIMARY CARE PHYSICIAN:  [name], [name]   UROLOGIST:  Dr. Michela Pitcher.   CHIEF COMPLAINT:  Anorexia/anemia, follow up hospitalization.   PROBLEM LIST:  1. Anemia of chronic disease, most likely secondary to chronic renal      failure.  2. Pancolonic diverticulosis.  3. Cerebrovascular disease.  4. Hypertension.  5. Depression.  6. Gallstone pancreatitis and cholecystectomy in 2005.  7. History of hypothyroidism.  8. History of hyperparathyroidism.  9. Proteinuria.   SUBJECTIVE:  Ms. Preyer is a 77 year old African American female.  She has been followed by Dr. Stann Mainland for anemia of chronic disease.  She  was recently hospitalized.  Her hemoglobin dropped from the 14 range  approximately 4 months ago to 9.1 with hematocrit of 26 and MCV of 92.9.  She had a normal a.m. cortisol.  She had a creatinine of 4.10.  It was  2.12 prior to discharge.  She had previously been admitted back in July  2008 with a creatinine of 16.78.  She had a ferritin of 321 and iron of  25, TIBC 252, percent saturation 10, UIBC 227.  B12 was normal as was  folate.  RBC 2.99.  She had a negative alpha-fetoprotein and CEA.  She  had a normal TSH.  She has had a history of failure to thrive and loss  of appetite.  Her current weight today is 134.5 pounds.  This is 15  pounds less than when we initially began evaluating her back 10 months  ago.   She had been seen by Dr. Michela Pitcher recently for a urinary tract infection.  She is supposed to undergo cystoscopy next week by him.  She denies any  fever or chills.  She tells me she cannot eat anything.  She has chronic  nausea.  She has vomited on one occasion since discharge from the  hospital.  She denies any abdominal pain.  Denies any weakness but does  have chronic fatigue.  Has been having a normal soft brown bowel  movement every day.   Denies any rectal bleeding or melena.  Denies any  abdominal pain.   CURRENT MEDICATIONS:  See the list from Aug 15, 2007.   ALLERGIES:  BENADRYL, CIPRO and SULFA.   OBJECTIVE:  VITAL SIGNS:  Weight 134.5 pounds, height 63 inches,  temperature 98.2, blood pressure 100/64, and pulse 60.  GENERAL:  She is an elderly [place] American female who is alert,  oriented, pleasant, cooperative, in no acute distress.  HEENT.  Sclerae are clear.  Nonicteric.  Conjunctivae pink.  Oropharynx  pink and moist without any lesions.  CHEST:  Heart regular rate and rhythm.  Normal S1 and S2.  ABDOMEN:  Positive bowel sounds x4.  No bruits auscultated.  Soft,  nontender, nondistended without palpable mass or hepatosplenomegaly.  No  rebound tenderness or guarding.  EXTREMITIES:  Without edema bilaterally.  SKIN:  Pale and dry without any rash or jaundice.  RECTAL:  No external lesions visualized.  Good sphincter tone.  No  internal masses visualized.  There was a small amount of light brown  stool obtained from the vault which was Hemoccult negative.   IMPRESSION:  Ms. Holl is a  77 year old female with chronic renal  insufficiency and recent hospitalization for anemia.  She has acute-on-  chronic anemia.  Her chronic anemia is felt to be due to chronic kidney  disease.  She did, however, have a significant drop in hemoglobin from 4  months ago from normal to 9.1.  She denies any gross GI bleeding and she  is Hemoccult negative in the office today.   IMPRESSION:  The last colonoscopy was performed through Dr. Arnoldo Morale'  office on January 23, 2006, which showed multiple diverticula in the  ascending colon, descending colon and sigmoid colon.  This was an  otherwise normal exam.  Given the fact that the last colonoscopy was  less than 2 years ago, I will need to discuss whether Dr. Stann Mainland would  like to pursue another colonoscopy herself given Ms. Ribas's  significant weight loss.  Her son is  questioning whether she could have  an appetite stimulant at this time, and I feel that further workup of  her weight loss needs to be completed prior to this.  Her chronic nausea  and weakness could be attributed to her chronic kidney disease as well.   PLAN:  1. Will discuss whether Dr. Stann Mainland wants to repeat colonoscopy and/or      EGD.  2. Hemoccult stools x3.  3. Will discuss appetite stimulants such as Megace with Dr. Stann Mainland      pending lab results.  4. CBC and MET-7 today.       Vickey Huger, N.P.  Electronically Signed     R. Garfield Cornea, [name]  Electronically Signed    KJ/MEDQ  D:  08/15/2007  T:  08/15/2007  Job:  [id number]   cc:   Dr. Odella Aquas, [name]

## 2010-08-09 NOTE — Consult Note (Signed)
[name], [name]             ACCOUNT NO.:  [id number]   MEDICAL RECORD NO.:  [id number]          PATIENT TYPE:  INP   LOCATION:  A202                          FACILITY:  [facility]   PHYSICIAN:  Alison Murray, M.D.DATE OF BIRTH:  March 03, 1931   DATE OF CONSULTATION:  DATE OF DISCHARGE:                                 CONSULTATION   __________   PAST MEDICAL HISTORY:  1. History of hypothyroidism.  2. History of depression.  3. History of diverticulosis.  4. History of recurrent urinary tract infection.  According to her,      this was when she was pregnant with her children.  5. Degenerative joint disease.  6. Cerebrovascular accident with internal capsule involvement with      mild left sided weakness.   At this moment, the patient denies any nausea,any vomiting.  The patient  also denies any __________.   ALLERGIES:  She is allergic to SULFA medications.   PAST SURGICAL HISTORY:  Cholecystectomy.   FAMILY HISTORY:  No family history of coronary artery disease but there  is some family history of cancer.   MEDICATIONS:  Consist of:  1. Lovenox 40 mg subcu q 24 hours.  2. Tricor 48 mg p.o. daily.  3. __________  4. Protonix 40 mg IV q 24 hours.  Other medications p.r.n.   REVIEW OF SYSTEMS:  Complains of weakness. At this moment denies nausea  and vomiting but she came in with the complaint.  She also says that she  has some abdominal pain.  She denies any diarrhea.  She denies any  urgency or frequency.  No chest pain or shortness of breath.   PHYSICAL EXAMINATION:  VITAL SIGNS:  Temperature 98.3, pulse of 83,  __________.  ABDOMEN:  Soft, positive bowel sounds.  EXTREMITIES:  No edema.   LABORATORY DATA:  Her urine output since she came in was about 400 mL  per hour.  Her white blood cell count is 8.7, hemoglobin 9.9, hematocrit  29.7.  __________ her hemoglobin has dropped to 6.5, platelets are  374,000.  Sodium 131, when she came in it was 140.  Potassium  4.7.  __________. BUN 89, creatinine 16.3.  When she came in it was 16.8.  Prior to that it was 18. Her GFR at this moment is improved. Albumin  2.3.  Protein 5.6, calcium __________ .4.  She had workup on July 15  with iron saturation of __________ percent.   Ultrasound of the kidney shows no hydronephrosis.  Her right kidney is  11.5, left kidney of 10.9.  She has a urinalysis which was done during  her admission yesterday with specific gravity 1.005, pH 6.__________,  trace blood., protein negative.  Leukocytes negative, nitrites negative.   ASSESSMENT:  1. Renal insufficiency.  At this moment, seems to be acute however      since she had a creatinine  of 1.8 in April.  Not sure whether she      has underlying chronic renal failure.  I will __________ creatinine      clearance of less than __________.  The patient currently has  acute      renal failure.  Her urine at this moment appears to be benign,      hence __________.  Her nausea and vomiting could be because of      that, however, as stated above, not sure if the nausea and vomiting      is because of __________ or because of the renal failure.  Her      condition is one of fluid overload.  2. Hypertension.  Blood pressure seems to be controlled very well.  3. History of cerebrovascular accident with residual left sided      weakness.  4. History of depression.  5. History of previous urinary tract infection.  Urine at this moment      seems to be clear.  6. Degenerative joint disease.   RECOMMENDATIONS:  I will increase her IV fluids to 150 mL per hour. Once  the patient has received about 2 liters, I will give her 120 mg of  Lasix.  I will continue her present management and since her potassium  at this moment is okay, and also the lungs are clear without any sign of  acidosis or signs of fluid overload, will continue with conservative  management.  If her renal function does not improve, I have discussed  with the staff  that we may need to do hemodialysis.  At this moment,  anemia may be caused by iron deficiency anemia and not secondary to  chronic __________ .  Hence, will follow her H and H.  I will do a 24-  hour creatinine clearance and at this moment also do serum protein  electrophoresis and check an ANA.  I would repeat her UA __________.  If  she has subsequent proteinuria, will probably do ANA complement and will  check also __________ .      Alison Murray, [name]  Electronically Signed     BB/MEDQ  D:  10/20/2006  T:  10/21/2006  Job:  [id number]

## 2010-08-09 NOTE — H&P (Signed)
[name], [name]             ACCOUNT NO.:  [id number]   MEDICAL RECORD NO.:  [id number]          PATIENT TYPE:  INP   LOCATION:  A220                          FACILITY:  [facility]   PHYSICIAN:  Anselmo Pickler, DO    DATE OF BIRTH:  05/03/1930   DATE OF ADMISSION:  07/14/2007  DATE OF DISCHARGE:  LH                              HISTORY & PHYSICAL   CHIEF COMPLAINT:  Weakness.   HISTORY OF PRESENT ILLNESS:  The patient is a 77 year old woman who  presented to the [facility] Emergency Room, who was brought in by her  family member with a chief complaint of weakness that started a couple  of days ago.  She stated too that she had had intermittent knee pain.  Also, she states that about a year ago she was seen here at [facility]  when she was in renal failure.  She stated that she went to a kidney  doctor, a female nephrologist here in [place], but has not seen her  for quite some time.  She also states that she has had a decrease in  appetite and currently has not seen anybody for this prior to being  admitted.   PAST MEDICAL HISTORY:  1. Hypertension.  2. Anemia.  3. Diverticulitis.  4. CVA in the past.   SURGICAL HISTORY:  Cholecystectomy.   SOCIAL HISTORY:  Nonsmoker, nondrinker, no drug abuse.  Lives at home.   ALLERGIES:  Her allergies are to BENADRYL, CIPRO and SEPTRA.  There is  some concern about Cipro from her last admission, that this could have  caused her renal failure; however, there is no followup on that.   MEDICATIONS:  1. Aggrenox 1 tab twice a day.  2. Lexapro 5 mg once a day.  3. Lisinopril/hydrochlorothiazide 10/12.5.  4. TriCor oral 48 mg once a day.  5. AcipHex 20 mg once a day.  6. Multivitamin.   REVIEW OF SYSTEMS:  CONSTITUTIONAL:  Negative for fever and chills, but  positive for appetite change.  HEENT:  Her eyes are negative for eye  pain or changes in vision.  Ears, nose, mouth and throat are negative  for ear pain, changes in smell or sore  throat.  HEART:  Negative for  chest pain or dyspnea.  RESPIRATORY:  Negative for cough and wheezing.  GASTROINTESTINAL:  Negative for nausea, vomiting or diarrhea.  GU:  Negative for dysuria and flank pain.  MUSCULOSKELETAL:  Positive for  arthralgia and knee pain.  SKIN:  Negative for rash.  NEUROLOGIC:  Positive for weakness.  HEMATOLOGIC:  Positive for history of anemia.   PHYSICAL EXAMINATION:  VITAL SIGNS:  Blood pressure 112/62, pulse rate  80, respirations 18, temperature 98.1, pulse oximetry 98%.  GENERAL:  This is a well-developed, well-nourished 77 year old [place]  American female who is in no acute distress.  HEENT:  Her head is normocephalic, atraumatic.  Eyes:  EOMI.  No scleral  icterus or conjunctival injection.  TMs are visualized bilaterally.  Mouth:  Positive dentures.  No erythema or exudate noted.  NECK:  Supple.  No lymphadenopathy or carotid bruits  noted.  CARDIOVASCULAR:  Regular rate and rhythm.  S1 and S2 heard.  RESPIRATORY:  Clear to auscultation bilaterally, no rales, wheezes or  rhonchi.  ABDOMEN:  Soft, nontender and non-distended.  No organomegaly noted.  Bowel sounds in all 4 quadrants.  EXTREMITIES:  Positive pulses.  No edema, cyanosis or ecchymosis noted.  Full range of motion and good strength in all of her extremities.  NEUROLOGIC:  II-XII grossly intact.  Alert and oriented x3.  The patient  is appropriate.  SKIN:  Good turgor.  Good texture.  No ecchymosis noted.   TESTS:  The patient had a urine microscopic which showed a few bacteria.  Her urine macroscopic showed nitrite negative, glucose and leukocyte  esterase small and she has a small amount of hemoglobin and her urine  appeared to be hazy.  BMP:  Sodium 138, potassium 2.9, chloride 107,  carbon dioxide 20, glucose 121, BUN 82, creatinine 4.72.  Her white  count is 10.0, hemoglobin of 12.1, hematocrit of 35.2, platelet count of  421,000.   ASSESSMENT AND PLAN:  1. Renal failure.  2.  Hypokalemia.  3. Urinary tract infection.   PLAN:  Plan will be to admit the patient to the service of [facility],  inpatient.  We will start the patient on IV antibiotics, Macrodantin,  due to her history of possible reaction to Cipro.  We will hydrate her  cautiously with IV fluids.  We will do DVT and GI prophylaxis.  We will  place her on her home medications; however, we will not prescribe her  lisinopril and hydrochlorothiazide.  We will place her on another  antihypertensive that will not affect the kidneys.  We will also consult  Dr. Lowanda Foster in the morning and we will have repeat ultrasound done in  the morning as well.  We will continue to monitor the patient and change  therapy as necessary.      Anselmo Pickler, DO  Electronically Signed     CB/MEDQ  D:  07/15/2007  T:  07/15/2007  Job:  [id number]

## 2010-08-09 NOTE — Group Therapy Note (Signed)
[name], [name]             ACCOUNT NO.:  [id number]   MEDICAL RECORD NO.:  [id number]          PATIENT TYPE:  INP   LOCATION:  [id number]                          FACILITY:  [facility]   PHYSICIAN:  Salem Caster, DO    DATE OF BIRTH:  Sep 03, 1930   DATE OF PROCEDURE:  10/04/2007  DATE OF DISCHARGE:                                 PROGRESS NOTE   SUBJECTIVE:  The patient still appears lethargic and slightly confused.  The patient's condition has not drastically changed at this time.  The  patient's son has been encouraging the patient to increase her oral  intake.  The patient seems to be complying with her son.  I had a long  discussion with the son regarding the patient's condition and he feels  that the patient has given up.  He states that the patient has been  doing this over the past few years or so and he is not sure that the  patient wants any drastic measures, but he will confer with his family  regarding further recommendations from this family.   OBJECTIVE:  VITAL SIGNS:  Temperature 98.9, respirations 20, heart rate  113, blood pressure 96/62, saturating 99% on 2 liters.  CARDIOVASCULAR:  S1 and S2 regular with no murmurs, rubs, or gallops.  LUNGS:  Clear to auscultation bilaterally with no rales, rhonchi, or  wheezing.  ABDOMEN:  Soft, nontender, and nondistended with positive bowel sounds.  EXTREMITIES:  No cyanosis, clubbing, or edema.  NEUROLOGY:  The patient is still confused.  She is very lethargic, but  will awake and answer questions.   LABORATORY DATA:  Hemoglobin 8.2, hematocrit 25.3, white count 9.7,  platelets 243.  Sodium 139, potassium 3.4, chloride 117, CO2 12, BUN 44,  creatinine 3.30.  Glucose 81, AST 108, ALT 125, alkaline phosphatase 31,  total bilirubin 1.4, albumin 2.9.  The patient has a hepatitis B surface  antigen pending as well as a hepatitis C antibody pending.  His stool  hemoccult was negative.  Hemoglobin A1C is 5.3.  TSH is 0.813.   ASSESSMENT:  1. Generalized fatigue and malaise.  We will continue physical therapy      daily.  This could be secondary to her renal insufficiency.      According to family members, the patient at times is choosing to      not eat or drink.  Gaston Islam. Neijstrom, [name] has been consulted and      has ordered multiple radiologic studies.  So far her radiologic      studies have all been within normal limits.  The CT of her head did      show some mild atrophy and remote left thalamic lacunar infarct.      Overall negative.  CT of her chest with no evidence of any acute      abnormalities or metastatic disease.  CT of her abdomen      unremarkable.  CT of her pelvis also unremarkable.  2. Renal failure, acute on chronic.  Continue IV fluids.  Nephrology      is following the patient  at this time.  She seems to be slowly      improving.  3. Hypokalemia.  The patient continues to be replaced orally with      potassium chloride.  4. Elevated liver function tests.  Gastroenterology has been      consulted.  We are awaiting their recommendations at this time.      Salem Caster, DO  Electronically Signed     SM/MEDQ  D:  10/04/2007  T:  10/04/2007  Job:  [id number]

## 2010-08-09 NOTE — Group Therapy Note (Signed)
[name], [name]             ACCOUNT NO.:  [id number]   MEDICAL RECORD NO.:  [id number]          PATIENT TYPE:  INP   LOCATION:  [id number]                          FACILITY:  [facility]   PHYSICIAN:  Salem Caster, DO    DATE OF BIRTH:  31-May-1930   DATE OF PROCEDURE:  10/08/2007  DATE OF DISCHARGE:                                 PROGRESS NOTE   SUBJECTIVE:  This is a 77 year old African-American female who presented  with 5 to 6 day history of fatigue.  The patient had fatigue, decreased  appetite.  She was seen fairly recently by nephrology as an outpatient  for renal insufficiency and anemia.  The patient's family decided to  bring the patient in due to her weakness.  The patient had previous  episode of this back in May and was discharged from [facility]  with diuretic/HCTZ induced nephropathy and chronic renal insufficiency  as well as failure to thrive.  The patient presented with similar  symptoms.  She has been slowly improved. She is being followed by  nephrology and gastroenterology secondary to her elevated liver enzymes  as well as oncology.  Today, the patient is sitting up in a chair, seems  to be improving.  She is more awake and alert.   OBJECTIVE:  VITAL SIGNS:  Temperature is 98, pulse 86, respirations 18,  blood pressure 98/59.  CARDIOVASCULAR:  Regular.  No murmurs, rubs or gallops.  LUNGS:  Clear to auscultation bilaterally. No rales, rhonchi or  wheezing.  ABDOMEN:  Soft, nontender, nondistended.  No rigidity or guarding.  EXTREMITIES:  No cyanosis, clubbing or edema noted.   LABORATORY DATA:  AST is 80. ALT is 86.  Alkaline phosphatase is 35.  White count 11.8, hemoglobin 11.2, hematocrit  33.5, platelet count  262,000.  Sodium 138, potassium 3.4, chloride 110, CO2 is 19.  Glucose  120. BUN 23, creatinine 1.92.   ASSESSMENT/PLAN:  1. Fatigue and malaise.  So far work up has been fairly unremarkable.      The patient continues to receive physical  therapy.  The patient      needs to be encouraged to increase her activities.  Fatigue and      malaise could be secondary to her chronic kidney disease.  2. Acute on chronic renal insufficiency.  The patient is being      followed by nephrology.  She seems to be almost at baseline at this      time.  3. Anemia.  Seems to be chronic in nature.  She is being seen by Dr.      Tressie Stalker.  This could be from an occult malignancy of unknown      etiology at this time but her anemia seems to be stable.  4. Elevated liver function tests.  She is being followed by      gastroenterology.  Unknown etiology on why her liver function tests      continue to be elevated.  Could be secondary to her failure to      thrive or chronic disease.  They continue to follow.  AMA, SMA from      the labs that they have ordered are pending at this time.  5. Altered mental status changes.  Neurology has been consulted.  They      believe it is toxic metabolic      encephalopathy that is improving.  The etiology includes UTI, acute      renal failure and some evidence of malnutrition.  Labs have been      ordered and they will continue to follow the patient.   Overall, the patient seems to be slowly improving.      Salem Caster, DO  Electronically Signed     SM/MEDQ  D:  10/08/2007  T:  10/08/2007  Job:  [id number]

## 2010-08-09 NOTE — Discharge Summary (Signed)
[name], [name]             ACCOUNT NO.:  [id number]   MEDICAL RECORD NO.:  [id number]          PATIENT TYPE:  INP   LOCATION:  A214                          FACILITY:  [facility]   PHYSICIAN:  Girard Cooter, MD         DATE OF BIRTH:  07/15/1930   DATE OF ADMISSION:  08/02/2007  DATE OF DISCHARGE:  05/12/2009LH                               DISCHARGE SUMMARY   DISCHARGE DIAGNOSES:  1. Weakness.  2. 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  3. Chronic renal insufficiency.  4. Hypertension.  5. Failure to thrive/loss of appetite/protein calorie malnutrition.  6. History of hypothyroidism.  7. History of hypoparathyroidism.  8. Proteinuria, read nephrotic range, no M protein.  9. Protein electrophoresis indicated above, kappa chains and lambda      light chains.  10.History of cerebrovascular accident.  11.Status post cholecystectomy.   BRIEF HISTORY OF PRESENT ILLNESS:  [name] is a 77 year old who  presented to [facility] dated date Aug 02, 2007, with  worsening weakness, decreased p.o. intake.  She was found to have a  severely increased creatinine of 4.10.  She was admitted to medical  telemetry unit and put on IV hydration.  Nephrology consultation was  requested from Dr. Lowanda Foster.  It was his impression that this is likely  acute on chronic renal insufficiency secondary to poor p.o. intake.  Labs were sent out to rule out other etiologies.   In regards to her poor appetite, dietary consultation was requested.  It  was our impression to rule out any other type of underlying malignancy.  In this regard, a alpha fetoprotein less than 1.3, a CEA 1 within normal  limits.  Her stool guaiacs were negative.  She had an anemia profile  which showed iron-deficiency anemia.  She was put on iron sulfate in  this regard.  She was given Boost t.i.d. and a dietary consultation was  requested for her severe protein calorie malnutrition,   hypoalbuminemia.   DISCHARGE VITAL SIGNS:  Temperature 99.2, pulse 85, respirations 20,  blood pressure 144/88, pulse ox 97% on room air.   DISCHARGE LABORATORY DATA:  Basic metabolic panel:  Sodium [id number],  potassium 3.9, chloride 115, CO2 18, glucose 98, BUN 28, creatinine  2.12.  CBC:  White count 8.3, hemoglobin 9.1, hematocrit 26, MCV 92.2,  platelets 305.   DISCHARGE/PLAN:  The patient will be discharged today to follow up with  primary care doctor within one week.  She is to follow up with her  nephrologist.  She is also to get continued follow-up of her anemia.  She will need GI investigation in the future.  This is very important.  Note that the patient had a colonoscopy in 2007.  She was evaluated by  Dr. Stann Mainland in December 25, 2006.  She is also to continue to follow up with Dr. Stann Mainland for her iron-  deficiency anemia and need for colonoscopy.   DISCHARGE DIET:  Heart-healthy diet.   ACTIVITY:  She is to resume her normal activity, increase activity  slowly, call with any falls.   DISCHARGE MEDICATIONS:  1. Aspirin 81 grams daily.  2. Calciferol vitamin 0.25 mcg p.o. daily.  3. Lexapro 10 mg p.o. daily.  4. Tricor 48 mg p.o. daily.  5. Atrovent 0.5 and albuterol 2.5 inhaled every 4 hours as needed.  6. Protonix 40 mg to be used orally twice a day.  7. Senokot 1 tablet every night.  8. Sodium bicarb 650 mg orally twice a day.  9. Tylenol 650 mg p.o. daily as needed.  10.Aggrenox p.o. daily, dose to be confirmed.  11.Metoprolol 25 mg b.i.d. Note, dosage should be confirmed and      adjusted accordingly.      Girard Cooter, MD  Electronically Signed     RR/MEDQ  D:  08/06/2007  T:  08/06/2007  Job:  [id number]

## 2010-08-09 NOTE — Discharge Summary (Signed)
[name], [name]             ACCOUNT NO.:  [id number]   MEDICAL RECORD NO.:  [id number]          PATIENT TYPE:  INP   LOCATION:  A220                          FACILITY:  [facility]   PHYSICIAN:  [name], MD     DATE OF BIRTH:  1930-05-15   DATE OF ADMISSION:  07/14/2007  DATE OF DISCHARGE:  04/22/2009LH                               DISCHARGE SUMMARY   PRIMARY MEDICAL DOCTOR:  [name], [name]   DISCHARGE DIAGNOSES:  1. Weakness likely secondary to acute renal failure, improved.  2. Chronic renal insufficiency.  3. History of diverticulitis.  4. History of cerebrovascular accident.  5. History of anemia.  6. Hypokalemia, resolved.   HISTORY/PHYSICAL:  Please review H&P dictated at the time of admission  for details regarding the patient's presenting illness.   BRIEF HOSPITAL COURSE:  Weakness:  This is a 77 year old African-  American female who has a history of hypertension, who presented with  complaints of weakness.  The patient was evaluated in the ED where she  was noted to have acute renal failure with BUN of 82 and creatinine of  4.7, potassium was 2.9.  The patient was admitted to the hospital for  further evaluation.  She had a similar presentation last year when again  she was noted to have acute renal failure.  The patient was on  lisinopril and hydrochlorothiazide both of which were discontinued.  The  patient was put on IV fluids and was also seen by Dr. Lowanda Foster,  nephrologist.  She was given potassium supplementation.  Slowly her  renal function has improved down to 33 and 2.0 respectively.  Her  potassium has come up to 4.2.  Symptomatically, she has improved.  She  is ambulating.  She is tolerating p.o. intake with no difficulties.  She  underwent ultrasound of her kidneys which did not show any acute  abnormalities as well.  We are going to go ahead and discontinue her ACE  inhibitor and diuretic, and start her on beta blockers for her blood  pressure control.   On the day of discharge, she is feeling well.  Does not have any  complaints to offer.  She feels that she is improved compared to when  she was admitted.  Vital signs are all stable.  Blood pressure 158/80  this morning.  It was 125/59 yesterday afternoon.  Examination is  unremarkable otherwise.   DISCHARGE MEDICATIONS:  1. Aggrenox 1 tablet b.i.d.  2. Lexapro 5 mg daily.  3. Tricor 48 mg daily.  4. Aciphex 20 mg daily.  5. Multivitamin 1 tablet daily.  6. Metoprolol 25 mg b.i.d.  7. Macrodantin 100 mg p.o. q.h.s. for 2 more days.  8. She has been asked to discontinue her lisinopril and      hydrochlorothiazide.   DISCHARGE INSTRUCTIONS:  1. Follow up BMET on July 22, 2007 with results to Dr. Lowanda Foster.  2. The patient to follow up with Dr. Lowanda Foster as well in 1 week's      time.   DIET:  She can have a renal diet.   PHYSICAL ACTIVITY:  Increase  activity slowly.   Total time on discharge about 40 minutes.      [name], MD  Electronically Signed     GK/MEDQ  D:  07/17/2007  T:  07/17/2007  Job:  [id number]   cc:   Alison Murray, [name]  Fax: [phone]   [name], [name]  Fax: [phone]

## 2010-08-09 NOTE — Group Therapy Note (Signed)
NAMEOLEDA, GUIFFRE             ACCOUNT NO.:  [id number]   MEDICAL RECORD NO.:  [id number]          PATIENT TYPE:  INP   LOCATION:  [id number]                          FACILITY:  [facility]   PHYSICIAN:  Kofi A. Merlene Laughter, [name] DATE OF BIRTH:  Oct 07, 1930   DATE OF PROCEDURE:  DATE OF DISCHARGE:                                 PROGRESS NOTE   Temperature 98.6, respirations 16, pulse 93, blood pressure 83/51.  No  new reports are reported.  Chart indicates there is some concern about  back pain and right leg weakness.  MRI of the lumbar spine has been  ordered.  The patient is awake and alert.  She is lucid and coherent.   ASSESSMENT/PLAN:  1. Resolved cephalopathy with metabolic causes.  2. Low back pain.  MRI has been ordered.  3. She also has a vitamin B12 level, which has not been done and we      will reorder this.      Kofi A. Merlene Laughter, [name]  Electronically Signed     KAD/MEDQ  D:  10/09/2007  T:  10/09/2007  Job:  [id number]

## 2010-08-09 NOTE — Consult Note (Signed)
[name], [name]             ACCOUNT NO.:  [id number]   MEDICAL RECORD NO.:  [id number]          PATIENT TYPE:  INP   LOCATION:  [id number]                          FACILITY:  [facility]   PHYSICIAN:  Caro Hight, [name]      DATE OF BIRTH:  1930-05-28   DATE OF CONSULTATION:  DATE OF DISCHARGE:                                 CONSULTATION   REASON FOR CONSULTATION:  Elevated LFTs.   HISTORY OF PRESENT ILLNESS:  [name] is a pleasant 76 year old  African American female whom we have been consulted regarding elevated  LFTs.  She was admitted with complaints of weakness, fatigue, and  anorexia.  She actually was hospitalized back in May of 2009 with  similar symptoms.  At that time, she was noted to have a failure to  thrive, loss of appetite, protein calorie malnutrition situation,  ongoing weakness, and also renal insufficiency.  Upon presentation, she  had a creatinine of 4.73.  Apparently, her baseline is right around 2.  She is being followed by Dr. Lowanda Foster.  She has also had a drop in her  hemoglobin from 9.9 to 8.2 with hydration.  There has been no overt GI  bleeding, and actually, she has been Hemoccult negative x1.  She was  also noted to have metabolic acidosis.  Her CO2 has improved.  Dr.  Tressie Stalker has seen the patient regarding overall weakness, weight loss,  and to rule out malignancy.  The patient had a CT of the head, chest,  abdomen, and pelvis yesterday without contrast.  She had some mild  atrophy and a remote left thalamic lacunar infarct.  In her chest, there  was no acute abnormality or any evidence of metastatic disease.  Abdominopelvic CT showed some colonic diverticulosis.  Otherwise,  unremarkable.   The patient is alert and oriented to person and place.  She is not  oriented to time.  She is somewhat slow to respond to questions.  She  states she has a poor appetite.  She denies nausea or vomiting.  She  states she has to force herself to eat.  She has been  seen by our  practice for over a year.  In that period of time, she has had a 25-  pound weight loss.  She has lost 10 pounds since May of 2009.  She  denies any nausea or vomiting, heartburn, dysphagia, odynophagia,  constipation, diarrhea, melena, or rectal bleeding.   MEDICATIONS AT HOME:  1. Tricor 48 mg daily.  2. Calcium and vitamin D 600 mg daily.  3. Multivitamin daily.  4. Omeprazole 20 mg daily.  5. Megace 40 mg per mL to take 1 teaspoon b.i.d.  6. Potassium chloride 10 mEq daily.  7. Aggrenox b.i.d.  8. Metoprolol 25 mg daily.   ALLERGIES:  SULFA, CIPRO, and BENADRYL.   PAST MEDICAL HISTORY:  Hypertension, anemia of chronic disease,  diverticulosis, chronic renal failure, CVA, history of hypothyroidism,  secondary hyperparathyroidism, depression, degenerative joint disease,  hypertension, proteinuria.  She had gallstone pancreatitis and  cholecystectomy in 2005, colonoscopy in October of 2007 by Dr. Arnoldo Morale  revealed pan-diverticulosis.  EGD in June of 2009 revealed a Schatzki's  ring and patchy antral erythema with no H. pylori present.  Small bowel  biopsy was benign as well.   FAMILY HISTORY:  Mother and father both had cancer of unknown origin.   SOCIAL HISTORY:  She lives with her son.  She is widowed.  She has 6  children.  She retired from [employer].  No tobacco or alcohol use.   REVIEW OF SYSTEMS:  See HPI for GI and constitutional.  CARDIOPULMONARY:  She denies chest pain or shortness of breath.  GENITOURINARY:  She  denies dysuria or hematuria.  She has had some incontinence.   PHYSICAL EXAM:  Temperature 98.2, pulse 96, respirations 20, blood  pressure 119/66, weight 57 kg.  GENERAL:  Pleasant elderly black female in no acute distress.  SKIN:  Warm and dry.  No jaundice.  HEENT:  Sclerae not icteric.  Oropharyngeal mucosa moist and pink.  No  lesions or edema, or exudate.  No lymphadenopathy or thyromegaly.  CHEST:  Lungs are clear to auscultation.   CARDIAC:  Exam reveals regular rate and rhythm.  Normal S1, S2.  No  murmurs, rubs, or gallops.  ABDOMEN:  Positive bowel sounds.  Soft and nontender, nondistended.  No  organomegaly or masses.  No rebound or guarding.  No abdominal bruits or  hernias.  LOWER EXTREMITIES:  No edema.   LABS:  White count is 9700, hemoglobin 8.2, MCV 98.3, platelets 243,000.  Sodium 139, potassium 3.4, BUN 44, creatinine 3.3, glucose 81.  Total  bilirubin was 1.8 on admission, today is 1.4.  Alkaline phosphatase was  32, today is 31.  AST was 94, today is 108.  ALT was 127, now 125.  Albumin is 2.9.  In May of 2009, LFTs were normal, except albumin low at  2.4.  Urine culture positive for E. coli.  Sed rate elevated at 78, INR  1.3, TSH 0.813.  Cortisol level 14.6.  Back in May her iron studies were  consistent with anemia of chronic disease.  B12 and folate were normal.  Alpha-fetoprotein tumor marker, less than 1.4 and a CA level was 1.   IMPRESSION:  The patient is a 77 year old female with anorexia, weight  loss, weakness, acute on chronic renal failure, anemia of chronic  disease, who has been admitted for the above as well as metabolic  acidosis.  She has new elevation of total bilirubin and transaminases.  Etiology not clear at this time.  Viral hepatitis unlikely, but needs to  be excluded.  Suspect may be due to medication effect.  Less likely due  to biliary etiology, given the unremarkable findings on CT and that she  is asymptomatic.  Her anorexia and weight loss is not well defined at  this point.  She clearly has had a 25-pound weight loss in 1 year.  According to the family, she basically just refuses to eat at times.  She does have some confusion on exam and not oriented to time.  Question  the possibility of neurological cause of her anorexia or weight loss.   RECOMMENDATIONS:  1. Will recheck LFTs in the morning.  2. I discussed further workup with Dr. Stann Mainland.  3. Consider neurological  consult.      Neil Crouch, P.A.      Caro Hight, [name]  Electronically Signed    LL/MEDQ  D:  10/04/2007  T:  10/04/2007  Job:  [id number]   cc:  [name], [name]  Fax: [phone]

## 2010-08-09 NOTE — Assessment & Plan Note (Signed)
NAMEJAYLIANA, [name]              CHART#:  [id number]   DATE:  11/20/2007                       DOB:  Oct 11, 1930   REFERRING PHYSICIAN:  [name], MD.   PROBLEM LIST:  1. Transient elevation in liver enzymes, likely secondary to      medication.  2. Pancolonic diverticulosis.  3. Anemia of chronic disease most likely secondary to chronic renal      insufficiency.  4. Cerebrovascular disease.  5. Hypertension.  6. Depression.  7. Gallstone pancreatitis with subsequent cholecystectomy in 2005.  8. History of hypothyroidism.  9. History of hyperparathyroidism.  10.Proteinuria.   SUBJECTIVE:  The patient is a 77 year old female who presents as a  return patient visit.  She was last seen as an inpatient when she  presented with anemia with a ferritin of 376 where she was known to have  a ferritin of 376 in July 2008 and a repeat in May 2009 showed a  ferritin of 321.  She had an upper endoscopy in June 2009, which showed  some patchy erythema.  Biopsies of the small intestine showed no  evidence of celiac sprue.  Her gastric biopsy showed reactive  gastropathy.  She was also seen and evaluated because she had mildly  elevated liver enzymes.  Her ALT was 125-127 and her AST was 108-94.  She had serologies for autoimmune hepatitis and primary biliary  cirrhosis checked, which were negative.  Her hepatitis B, C, and A  serologies were negative.  She also had an MRCP to evaluate for  choledocholithiasis and her MRI showed no evidence of biliary duct  dilation and no filling defects in the common bile duct.  She was  discharged with followup liver enzymes and her subsequent followup liver  enzymes were and AST from 128-115 and ALT of 126-112.  She reports her  appetite is improved and she feels good.  Her weight is up 5 pounds  since May 2009.   MEDICATIONS:  1. Aggrenox.  2. Omeprazole 20 mg daily.  3. Potassium chloride.  4. Iron 50 mg.   OBJECTIVE:  Physical exam:VITAL  SIGNS:  Weight 139 pounds, height 5 feet  3 inches, BMI 24.6 (healthy) temperature 98.1, blood pressure 120/72,  and pulse 64.  GENERAL:  She is in no apparent distress.  Alert and interactive.LUNGS:  Clear to auscultation bilaterally.CARDIOVASCULAR:  Regular  rhythm.ABDOMEN:  Bowel sounds are present.  Soft, nontender, and  nondistended.   LABORATORY DATA:  November 05, 2007, BUN 26, creatinine 1.72, total  bilirubin 0.5, alkaline phosphatase 63, AST is 31, and ALT 29.   ASSESSMENT:  The patient is a 77 year old female who had evaluation for  elevated liver enzymes, which did not reveal any obvious etiology and  her enzymes are now resolved.  Differential diagnosis for this transient  elevation include infection medication and a low likelihood of passing a  distal common bile duct stone. Thank you for allowing me to see the  patient in consultation.  My recommendations follow.   RECOMMENDATIONS:  1. She has a follow up appointment to see me in 4 months.  Will check      her liver enzymes again.  2. She should continue her omeprazole 20 mg daily.       Caro Hight, [name]  Electronically Signed     SM/MEDQ  D:  11/21/2007  T:  11/22/2007  Job:  [id number]   cc:   [name], [name]

## 2010-08-09 NOTE — H&P (Signed)
[name], [name]             ACCOUNT NO.:  [id number]   MEDICAL RECORD NO.:  [id number]          PATIENT TYPE:  INP   LOCATION:  A202                          FACILITY:  [facility]   PHYSICIAN:  [name], MD     DATE OF BIRTH:  15-Nov-1930   DATE OF ADMISSION:  10/19/2006  DATE OF DISCHARGE:  LH                              HISTORY & PHYSICAL   PRIMARY CARE PHYSICIAN:  [name], [name]   ADMISSION DIAGNOSES:  1. Acute renal failure, unclear etiology.  2. History of hypertension.  3. History of dyslipidemia.  4. History of previous stroke.  5. Memory impairment secondary to the stroke.  6. History of depression.   CHIEF COMPLAINT:  Poor appetite, abdominal pain for the past 1 month.   HISTORY OF PRESENT ILLNESS:  The patient is a 77 year old African-  American female who has medical problems as stated above who actually  went to gastroenterologist's office today with complaints of difficulty  eating, nausea, vomiting, and abdominal pain. All of these symptoms have  been ongoing for about a month. The patient unfortunately is not a good  historian because of memory issues. Her daughter provided some of the  history but overall the history is quite poor. Basically for a month she  has been having a poor appetite. Apparently on July 14 she went to Dr.  [name] office and was diagnosed with sinusitis and was prescribed  Cipro. On July 21, she again went to Dr. [name] office and  apparently had blood work done there which as far as I know based on the  previous notes did not show any problems with her renal function except  for albumin of 3.4, the CMP was normal. Amylase and lipase was normal.  She did have anemia with a hemoglobin of 10, ferritin of 391. On July  23, she presented to the ED complaining of nausea. She did not have any  blood work done at that time. The nausea was thought to be secondary to  use of Cipro. The patient was hydrated and was discharged  home. No blood  work was done at that point.   The patient denies any dysphagia. She mentioned that she does not have  any difficulty swallowing, just that she does not feel like eating food.  Denies any problems with passing urine. Her last urination was this  morning. She does admit to having nausea, some emesis, and she has been  having diarrhea though she is unable to quantify it. The abdominal pain  is located mostly in the lower abdomen. She is again unable to quantify  it for me, unable to describe the pain. She just says  that it hurts. No  other new medications apart from the Cipro. She has not been taking her  Lexapro for the past 2 days. Overall the history is somewhat poor  because of the patient's memory impairment.   MEDICATIONS AT HOME:  1. Aggrenox 1 tablet b.i.d.  2. Lexapro 5 mg daily. Please note she has not been taking it for 2      days now.  3.  Lisinopril/HCTZ 10/12.5 twice a day.  4. Tricor 48 mg daily.  5. AcipHex 20 mg daily.  6. Centrum 1 tablet daily.  7. Phenergan as needed.  8. Tylenol as needed.   ALLERGIES:  SULFA and BENADRYL.   PAST MEDICAL HISTORY:  CVA in 2003 which resulted in short-term memory  loss and some mild left-sided deficits. History of hypertension and  depression. There is a history of cholecystectomy in 2005 as a result of  gallstone pancreatitis. History of colonoscopy done some time last year  which found diverticulosis otherwise unremarkable.   SOCIAL HISTORY:  Lives in [place] with her son. She is a widow.  Denies any smoking, alcohol or illicit drug use.   FAMILY HISTORY:  Mother and father have cancer of unknown origin  otherwise unremarkable.   REVIEW OF SYSTEMS:  Very difficult because of the patient's mental  status.   PHYSICAL EXAMINATION:  VITAL SIGNS:  Temperature was 97.2, blood  pressure 144/72, heart rate 67, respiratory rate was about 18,  saturation 99% on room air.  GENERAL:  An elderly female quite  lethargic, difficult to talk with at  times but appears to be in no distress.  HEENT:  There is no pallor, no icterus. Oral mucous membrane is moist.  No oral lesions are noted.  NECK:  Soft and supple. No thyromegaly is appreciated.  LUNGS:  Reveals a few crackles at the bases which clear with deep  breathing, otherwise unremarkable.  CARDIOVASCULAR:  S1, S2 is normal, regular, no murmurs appreciated. No  S3, S4 are heard.  ABDOMEN:  Soft. There is tenderness in the midline in the lower part of  the abdomen and somewhere else on the right side. No rebound rigidity is  present. Bowel sounds are present. No organomegaly is appreciated. There  is some fullness in the suprapubic area.  EXTREMITIES:  Without edema. Peripheral pulses are palpable.  NEUROLOGIC:  She does have some left-sided weakness that is quite subtle  but otherwise no other focal deficits are present.   LABORATORY DATA:  Unfortunately no labs are available here quite yet.  She did have a CBC and CMET done today at an outside facility and the  white count was 9.6, hemoglobin 11.0, platelet count 427. MCV was 86.  Sodium 135, potassium 4.9, chloride 101, bicarb is 20. Glucose 123, BUN  was 88, creatinine 16.7. Total bilirubin, LFTs normal, albumin was 2.7,  total protein 6.2. UA was done which showed small blood, trace leukocyte  esterase, negative nitrite, negative protein, a few squamous epithelial  cells, 3-6 WBCs, few bacteria, few renal tubular cells.   She did have a CT of the abdomen and pelvis without contrast and it did  show small scattered abdominal ascites, small left pleural effusion,  scattered colonic diverticula, small amount of pelvic ascites. The  kidneys were thought to be unremarkable.   ASSESSMENT/PLAN:  This is a 77 year old, African-American female with  hypertension, depression and a history of CVA who presents with an  approximately 1 month history of poor appetite, weight loss overall 10-  12  pounds, nausea, vomiting, diarrhea and abdominal pain. This patient  is found to have acute renal failure based on blood work done by the GI  office. Remarkably her BUN is 88, creatinine is 16.7. Just a few days  ago apparently her BUN and creatinine were normal. Etiology for this  renal failure is unclear, definitely Cipro could be a possible culprit.  There is no real evidence for dehydration  causing the renal failure. She  did mention diarrhea but it did not sound very impressive enough the  have caused this renal failure. Hence drug-induced is the most likely  etiology. Obstructive lesions also need to be ruled out though CT did  not suggest unequal kidneys or hydronephrosis. Her abdominal pain could  be from the uremia and the renal failure. CT did not show any acute  abnormalities though it was a noncontrast study.   PLAN:  1. Acute renal failure. Will admit to the hospital. We will insert a      Foley to accurately monitor her urine output. Renal consultation      will be done. Send out blood work again so that we have labs in      this hospital. Urine lytes, urine eosinophil will be sent, UA will      be repeated. UPEP and SPEP will be done as well. IV fluids will be      given obviously. We will give her a bolus and then start on      maintenance fluids. Recheck labs tomorrow morning to see if she is      showing any improvement. At this time, I do not see any urgent need      for dialysis.  2. Abdominal pain. Again most likely from the renal failure. I will      check amylase and lipase. We will give her some pain medication as      needed and see how she progresses and will watch her closely. If      her pain gets worse we may need to obtain a GI or a surgical      consultation.  3. History of hypertension. At this point, we will hold off on her      antihypertensives agents which can aggravate and make renal      function worse.  4. History of cerebrovascular accident,  stable. Memory impairment is      an issue here.  5. DVT, GI prophylaxis will be initiated.  6. Anemia is likely from chronic disease, likely from kidney failure.      We will recheck hemoglobin here and recheck iron profile studies      here.   Reversed albumin/globulin ratio. We will do an SPEP and UPEP as  discussed above.   Further management decision will be based on results of initial testing  and the patient's response to treatment.      [name], MD  Electronically Signed     GK/MEDQ  D:  10/19/2006  T:  10/19/2006  Job:  [id number]   cc:   Alison Murray, [name]  Fax: [phone]   [name], [name]  Fax: [phone]   Caro Hight, [name]  [address]  [place] , [place] [address]

## 2010-08-09 NOTE — H&P (Signed)
NAMEJIMISHA, ASKELAND             ACCOUNT NO.:  [id number]   MEDICAL RECORD NO.:  [id number]          PATIENT TYPE:  INP   LOCATION:  A214                          FACILITY:  [facility]   PHYSICIAN:  Girard Cooter, MD         DATE OF BIRTH:  [birth year]   DATE OF ADMISSION:  08/02/2007  DATE OF DISCHARGE:  LH                              HISTORY & PHYSICAL   The patient is a 77 year old female who presents to [facility] with a chief complaint of worsening weakness that has been going  on for the last 2 or 3 days.  She was apparently sent here from primary  care office.  There was also concern about her worsening renal function  of 3.62.  Of note, the patient has had a recent admission and discharge  July 15, 2007 for similar complaints.  She was found to have acute  renal failure, and a nephrology consultation was requested by Dr.  Lowanda Foster, and it was his impression that this was likely secondary to  acute-on-chronic renal insufficiency secondary to ACE inhibitors, yet,  it seems like she was discharged on July 17, 2007 with no ACE  inhibitors on her medication list, but currently when she comes back  into the hospital, she has lisinopril/hydrochlorothiazide 10/12.5 on her  medication list currently.   REVIEW OF SYSTEMS:  Otherwise, her review of systems is negative.  She  denies any chest pain, shortness of breath, orthopnea.  She denies any  urinary frequency, urgency, or burning.  She denies any flank pain.  She  denies any pruritus.  Otherwise, 12-point review of systems is negative.  She also denies any focal neurological deficits.   PAST MEDICAL HISTORY:  1. Hypertension.  2. Anemia.  3. Diverticulitis.  4. History of CVA.  5. Recent history of acute renal insufficiency.   PAST SURGICAL HISTORY:  Status post cholecystectomy.   SOCIAL HISTORY:  She does not smoke.  She denies drugs or alcohol.  She  lives at home.   ALLERGIES:  SHE IS ALLERGIC TO BENADRYL,  CIPRO, AND SEPTRA.   MEDICATIONS AT HOME:  1. Aggrenox 1 tablet p.o. b.i.d.  2. Lexapro 5 mg p.o. daily.  3. Lisinopril/hydrochlorothiazide 10/12.5 b.i.d.  4. TriCor 48 mg once daily.  5. Aciphex 20 mg daily.  6. Multivitamins.  7. Macrobid, unknown dose.   PHYSICAL EXAMINATION:  GENERAL:  The patient is an elderly African  American, pleasant female in no obvious distress.  VITAL SIGNS:  Blood pressure 129/69, pulse ox 99% on room air, pulse 70,  respirations 20.  She is afebrile at 99.7.  HEENT:  Normocephalic, atraumatic.  Sclerae anicteric.  PERRLA.  Extraocular muscles intact.  NECK:  Supple.  No JVD, no carotid bruits.  CARDIOVASCULAR:  S1 and S2.  Regular rate and rhythm.  No murmurs, rubs,  clicks.  LUNGS:  Clear to auscultation bilaterally.  No rhonchi, rales, or  wheezes.  ABDOMEN:  Soft, nontender, nondistended.  Positive bowel sounds.  EXTREMITIES:  No clubbing, cyanosis, or edema.  NEUROLOGIC:  The patient is alert and  oriented x3.  Cranial nerves II-  XII are grossly intact.  SKIN:  Good skin turgor.   LABORATORY DATA:  Basic metabolic panel shows a low potassium of 2.8,  BUN 50, creatinine 4.10, calcium 9.2.  CBC shows a white count of 11,  hemoglobin 10.4, hematocrit 29.9, platelets 291, and a neutrophil count  of 69.   A chest x-ray was done which showed bibasilar airspace disease, left  greater than right, which is probably due to atelectasis.   ASSESSMENT AND PLAN:  A 77 year old here with weakness and concern from  her primary care office for worsening renal function.  Note that the  patient had an admission for a similar complaint, and she was taken off  of her angiotensin-converting enzyme inhibitor, and now she is currently  back on it.  She was discharged with no angiotensin-converting enzyme  inhibitor on her discharge medication list.  She had a renal ultrasound  done July 15, 2007 which showed no acute abnormalities and no  hydronephrosis, et  Ronney Asters.  Emergency room physician apparently had a  conversation with the nephrologist here, and he stated there was an  indication for further workup for her acute renal failure.  The  combination of hypokalemia and worsening renal function points towards  the angiotensin-converting enzyme inhibitor as being the culprit for her  renal insufficiency.  We will go ahead and admit her to medical  telemetry unit.  We will obviously hold her hydrochlorothiazide and  lisinopril.  We will continue the rest of her medications, Lexapro,  TriCor, Aciphex, multivitamins, deep venous thrombosis and  gastrointestinal prophylaxis with Lovenox and Protonix.  Obviously,  nephrology will be consulted.  I do not see any reason to repeat her  bilateral renal ultrasound at this point.  I would get a urine  protein/creatinine ratio.  The rest of management will be dependent on  the patient's hospital course.      Girard Cooter, MD  Electronically Signed     RR/MEDQ  D:  08/02/2007  T:  08/02/2007  Job:  [id number]

## 2010-08-09 NOTE — Consult Note (Signed)
[name], [name]             ACCOUNT NO.:  [id number]   MEDICAL RECORD NO.:  [id number]          PATIENT TYPE:  INP   LOCATION:  [id number]                          FACILITY:  [facility]   PHYSICIAN:  Kofi A. Merlene Laughter, [name] DATE OF BIRTH:  [birth year]   DATE OF CONSULTATION:  DATE OF DISCHARGE:                                 CONSULTATION   REASON FOR CONSULTATION:  Altered mental status.   HISTORY:  This is a 77 year old African American female who presents  with a 7-day history of fatigue.  She has had a weight loss, however,  over the last several months.  She reports having decreased appetite,  reasons are unclear.  The patient apparently was seen by nephrologist at  an outpatient setting because of anemia and decrease in her renal  function.  The patient on admission presented with elevated creatinine  of 4.7 and BUN of 6.8.  This was felt to be due to prerenal azotemia.  The patient has had some confusion and hence this neurological  consultation.  CT scan has been negative.  She has had improving renal  function.  She also has been diagnosed with a UTI.   PAST MEDICAL HISTORY:  1. Hypertension.  2. Anemia.  3. Diverticulitis.  4. Chronic renal failure.  5. Past history of cerebrovascular accident.   PAST SURGICAL HISTORY:  Cholecystectomy.   SOCIAL HISTORY:  No history of alcohol use, illicit drug use, or tobacco  use.  The patient lives alone.   ALLERGIES:  1. BENADRYL.  2. CIPRO.  3. SEPTRA.   HOME MEDICATIONS:  1. Aggrenox.  2. Metoprolol.  3. Omeprazole.   REVIEW OF SYSTEMS:  Significant for incontinence, cough, and achiness in  the leg.  Also, positive for confusion on presentation.  No focal  neurological symptoms.  No headaches.   PHYSICAL EXAMINATION:  GENERAL:  Average white lady in no acute  distress.  T-max 100.2, pulse 81, respirations 20, and blood pressure  126/63.  HEENT EVALUATION:  Neck is supple.  Head is normocephalic and  atraumatic.  ABDOMEN:   Soft.  EXTREMITIES:  No significant edema.  MENTATION:  The patient lies in bed with eyes open.  She is awake and  responsive.  She is lucid and no dysarthrias noted.  She is oriented x2.  No aphasia is observed.  She follows commands briskly.  CRANIAL NERVE EVALUATION:  The right pupil is 5 mm and left is 4.5, both  are brisk, reactive.  Both are round.  Extraocular movements are intact.  Facial muscle strength is symmetric.  Tongue is midline.  Visual fields  are intact.  Motor examination shows normal tone, bulk, and strength.  In the upper extremities, there is no pronator drift, she has at least  4+ in the legs.  Coordination shows no tremors.  There is no past-  pointing, dysmetria, or parkinsonism.  Reflexes are preserved.   SUPPORTIVE DATA:  Again, creatinine on presentation 4.8 with the BUN of  68,  glucose 128, sodium 139, potassium 3.9, chloride 129, and CO2 is  28.  The most recent labs, sodium 136,  potassium 3.8, chloride 109, CO2 is  18, BUN 23, creatinine 1.9, glucose 128.  WBC 11.4, hemoglobin 11.8, and  platelets 230.  ESR 78.  RPR nonreactive.  Thyroid 0.8.  Head CT scan  shows remote left thalamic infarct, lacuna type.  There is also atrophy.  Nothing acute is seen.   ASSESSMENT:  Toxic metabolic encephalopathy that is improving, the  etiology includes urinary tract infection and acute renal failure and  also some evidence of malnutrition.   RECOMMENDATIONS:  Additional blood test for vitamin B12 level and  homocystine level.  I will also treat her with thiamine.  TSH was normal  at 0.81.   Thanks for allowing me to participate in the care of your patient.      Kofi A. Merlene Laughter, [name]  Electronically Signed     KAD/MEDQ  D:  10/07/2007  T:  10/08/2007  Job:  [id number]

## 2010-08-09 NOTE — Group Therapy Note (Signed)
[name], [name]             ACCOUNT NO.:  [id number]   MEDICAL RECORD NO.:  [id number]          PATIENT TYPE:  INP   LOCATION:  A214                          FACILITY:  [facility]   PHYSICIAN:  Girard Cooter, MD         DATE OF BIRTH:  November 17, 1930   DATE OF PROCEDURE:  DATE OF DISCHARGE:                                 PROGRESS NOTE   SUBJECTIVE:  The patient still feeling somewhat weak.  She denies any  nausea, chest pain, shortness of breath.  The patient was examined by  bedside.   OBJECTIVE:  Pulse 71, respirations 20, blood pressure is 124/59,  temperature 98.8.  HEENT:  Normocephalic, atraumatic, sclerae anicteric, mucous membranes  are moist.  CARDIOVASCULAR:  S1, S2.  Regular rate and rhythm.  ABDOMEN:  Soft, nontender, nondistended, positive bowel sounds.  LUNGS:  Clear to auscultation bilaterally.  No rhonchi, rales, or  wheezes.  EXTREMITIES:  No clubbing, cyanosis, or edema.  NEUROLOGIC:  The patient is alert and oriented x3.  Cranial nerves II-  XII grossly intact.   LABORATORY DATA:  Beta natriuretic peptide is 32.9.  INR 1.2.  Phos 5.9.  Magnesium 2.0.  Total CK negative at 68.  Calcium 8.8.  Basic metabolic  panel showed a BUN of 50 and creatinine of 4.10.  CBC:  White count 11,  hemoglobin 10.4, hematocrit 29.9, platelets 291.  Chest x-ray showed  bibasilar airspace disease, left greater than right, probably due to  atelectasis.  UA pending.   ASSESSMENT AND PLAN:  The patient is a 77 year old female who presented  with:  1. Acute-on-chronic renal insufficiency thought to be secondary to her      combination of ACE and hydrochlorothiazide, although in discussion      with a family member she states that she has not been taking this      medication although this has been in her medication list.  Again,      this history is very much unclear.  I notice that on her admission      there was no UA done.  Will go ahead and proceed to monitor her      strict fluid  status including I's and O's.  Will get a urinalysis      and repeat her BMP this morning, basic metabolic panel and her      electrolytes.  Nephrology has already been consulted.  2. For hypertension, will continue metoprolol 25 mg p.o. b.i.d.  She      is well controlled currently.  Also, will continue her Aggrenox,      TriCor.   The rest of the plan is depending on her progress during this hospital  stay.  DVT prophylaxis and GI prophylaxis initiated.      Girard Cooter, MD  Electronically Signed    RR/MEDQ  D:  08/03/2007  T:  08/03/2007  Job:  [id number]

## 2010-08-09 NOTE — Group Therapy Note (Signed)
[name], [name]             ACCOUNT NO.:  [id number]   MEDICAL RECORD NO.:  [id number]          PATIENT TYPE:  INP   LOCATION:  [id number]                          FACILITY:  [facility]   PHYSICIAN:  Salem Caster, DO    DATE OF BIRTH:  20-Jul-1930   DATE OF PROCEDURE:  10/05/2007  DATE OF DISCHARGE:                                 PROGRESS NOTE   SUBJECTIVE:  [name] seems to be a little bit more alert and  oriented today.  The patient's son is still at bedside.  States that her  oral intake is slightly improved, but she is not eating as much.  Overall her condition is fairly unchanged.  The patient does not have  any complaints of NFT at this time.   OBJECTIVE:  VITAL SIGNS:  Temperature 97.5.  Pulse 86.  Respirations 18.  Blood pressure 94/43.  She is satting 100% on 2 L.  CARDIOVASCULAR:  S1 and S2 is regular.  No murmurs, rubs, or gallops.  LUNGS:  Clear to auscultation.  No rales, rhonchi, or wheezing.  ABDOMEN:  Soft, nontender, nondistended.  Positive bowel sounds.  EXTREMITIES:  No clubbing, cyanosis, or edema.  NEUROLOGIC:  The patient is a little bit more alert, still lethargic.  Will answer questions.   LABS:  Sodium 137, potassium 3.2, chloride is 114.  CO2 is 16, glucose  108, BUN 32, creatinine 2.51.  Total bili is 1.0, alk phosphatase 28,  AST 76, ALT is 98.   ASSESSMENT:  1. Generalized fatigue and malaise.  Will continue with physical      therapy at this time.  So far her workup has been fairly      unremarkable.  Her fatigue and malaise could be secondary to her      renal disease.  Also could be due to deconditioning and her      decreased nutrition.  2. Renal failure, acute and chronic.  Continue IV fluids.  Nephrology      continues to follow the patient at this time.  3. Elevated liver function tests.  They seem to be improving.  She has      been counseled and is following the patient.  4. Hypokalemia.  She is still hypokalemic.  Nephrology has  increased      her K-Dur to 3 times daily.  Will continue to follow.  5. Anemia.  Her blood count has dropped.  The patient is in the      process of receiving 2 units of packed red blood cells.  Will      continue to follow her hemoglobin and hematocrit at this time.      Salem Caster, DO  Electronically Signed     SM/MEDQ  D:  10/05/2007  T:  10/05/2007  Job:  [id number]

## 2010-08-09 NOTE — Procedures (Signed)
[name], [name]             ACCOUNT NO.:  [id number]   MEDICAL RECORD NO.:  [id number]          PATIENT TYPE:  INP   LOCATION:  A204                          FACILITY:  [facility]   PHYSICIAN:  Fay Records, MD, FACCDATE OF BIRTH:  1930-06-08   DATE OF PROCEDURE:  10/22/2006  DATE OF DISCHARGE:                                ECHOCARDIOGRAM   REFERRING PHYSICIAN:  Dr. Maryland Pink.   TEST INDICATIONS:  A 77 year old with history of hypertension, CVA, test  to evaluate LV function.   A 2-D echo with echo Doppler.  Left ventricle is normal with an end-  diastolic dimension of 39 mm, interventricular septum and posterior wall  are normal at 11 and 10 mm each.  Left atrium is normal in size at 34  mm.  Right atrium and right ventricle are normal.  Right aortic root is  normal at 28 mm.   The aortic valve is mildly thickened with some mild calcification.  There is no insufficiency.  Mitral valve is mildly thickened with mild  to moderate insufficiency (1-2/4), pulmonic valve is normal with no  insufficiency.  Tricuspid valve is normal with mild insufficiency.   Overall LV systolic function is normal with an LVEF of approximately 55-  60%.  RVEF is normal.   No pericardial effusion is seen.      Fay Records, MD, [facility]  Electronically Signed     PVR/MEDQ  D:  10/22/2006  T:  10/23/2006  Job:  [id number]   cc:   Dr. Maryland Pink

## 2010-08-09 NOTE — Group Therapy Note (Signed)
[name], [name]             ACCOUNT NO.:  [id number]   MEDICAL RECORD NO.:  [id number]          PATIENT TYPE:  INP   LOCATION:  A214                          FACILITY:  [facility]   PHYSICIAN:  Girard Cooter, MD         DATE OF BIRTH:  1931/02/11   DATE OF PROCEDURE:  08/04/2007  DATE OF DISCHARGE:                                 PROGRESS NOTE   The patient was examined by bedside today. I had a long discussion with  the patient in regards to her decreased p.o. intake and the reason why  she does not feel like eating.  She denies any abdominal pain.  She  denies any nausea or vomiting.  She states that she has loss of  appetite.  She denies memory problems. She is status post evaluation by  Nephrology.   OBJECTIVE:  VITAL SIGNS:  Her is temperature is 99.4, pulse 84,  respirations 18, blood pressure 127/59, pulse-ox 96% on room air.  HEENT:  Mucous membranes are somewhat dry.  CARDIOVASCULAR:  S1, S2. Regular rate and rhythm.  ABDOMEN:  Soft, nontender, nondistended, positive bowel sounds.  LUNGS:  Clear to auscultation bilaterally.  No rhonchi, rales or  wheezes.   LABORATORY DATA:  ABG shows metabolic acidosis, pH 0000000, . PCO2 of  29.4, PAO2 of 92.9, phos 3.1, and magnesium 1.7. Complete metabolic  panel shows sodium 139, potassium 3.8, chloride 117, CO2  of 15, BUN 36,  creatinine 2.93. Thyroid-stimulating hormone is 1.765.  Protein  electrophoresis 24.  Creatinine ratio 0.98. UA showed few bacteria,  white blood cells 21-50, and red blood cells 7-10. Urinalysis shows  large leukocyte esterase.  Random urine chloride 44, urine sodium 43,  urine potassium 15. The bilateral renal ultrasound dated July 15, 2007,  shows no acute abnormalities. Chest x-ray dated November 02, 2007, shows  bibasilar airspace disease, left greater than right.   ASSESSMENT/PLAN:  1. Acute on chronic renal insufficiency and non-gap metabolic acidosis-      with worsening BUN and creatinine, etiology  unclear. It may be      secondary to hydrochlorothiazide or diuretics as her potassium is      low. Urine output seems to be reasonably good.  2. Urinary tract infection. Will await cultures and continue her      Levaquin.  She is currently on day 1.  3. Anemia secondary probably to combination of iron-deficiency and      anemia of chronic disease. Hemoglobin low but stable.  4. Proteinuria, nephrotic range. No M protein in urine. Will await      repeat serum electrophoresis.  5. History of hyperparathyroidism.  6. Hypokalemia, unknown etiology.  Nephrology is following.  7. History of hypothyroidism.  8. History of recurrent urinary tract infections.  9. History of depression.  10.Poor appetite of unclear etiology.  11.UTI.   PLAN:  1. Dietary consult.  2. The patient should also at some point be evaluated for any      malignancy in the urinary tract system.  A urology consultation      would be helpful  in this case. I may proceed with this in the      morning. Continue renal dose levaquin for UTI for now.  3. Continue to encourage p.o. intake. May consider megase.  4. Continue to observe per nephrology recommendations.  5. Await testing including serum immunoelectrophoresis.  6. Replete her potassium.      Girard Cooter, MD  Electronically Signed     RR/MEDQ  D:  08/04/2007  T:  08/04/2007  Job:  [id number]

## 2010-08-09 NOTE — Discharge Summary (Signed)
[name], [name]             ACCOUNT NO.:  [id number]   MEDICAL RECORD NO.:  [id number]          PATIENT TYPE:  INP   LOCATION:  A313                          FACILITY:  [facility]   PHYSICIAN:  Merry Lofty, MD   DATE OF BIRTH:  January 06, 1931   DATE OF ADMISSION:  10/19/2006  DATE OF DISCHARGE:  LH                               DISCHARGE SUMMARY   DISCHARGE DIAGNOSIS:  1. Acute renal failure improved.  2. Pneumonia improved.  3. Hypertension fairly controlled.  4. Dyslipidemia, stable  5. History of stroke stable  6. Mild dementia, stable  7. Depression, stable.   HOME MEDICATIONS:  1. Levaquin 500 mg p.o. daily for 5 days  2. Aggrenox 1 tablet b.i.d..  3. Tricor 48 mg p.o. daily.  4. Aciphex 20 mg p.o. daily.  5. Centrum 1 tablet p.o. daily.  6. Tylenol  p.r.n. and Phenergan p.r.n.  7. Lisinopril:  The patient was on lisinopril and hydrochlorothiazide      10/12.5 twice a day.  This is on hold and it was deferred for PMD      if she needed again to restarted it after she has follow-up with      him in 1 week. The reason we held it is she came with dehydration,      acute renal failure and she is discharged with slightly elevated      creatinine.   HOSPITAL COURSE:  [name] is a 77 year old female patient with  past medical problems.  She presents with poor appetite and poor oral  intake with abdominal pain of 1 month duration and she was found to have  very severe dehydration with acute renal failure.  During admission her  BUN and creatinine was 89 x 16.59 and she was put on IV fluid continuous  and nephrology also was following her and she improved slowly but  persistently and today, BUN, creatinine is 16 x 1.6.  She had pneumonia  on the chest x-ray as well and she was put on Levaquin and she remained  afebrile and there was not any leukocytosis and the patient responded  well and today she is very stable and she is ready to go home.  Admission was dictated on  the admission note for referring.  Ultrasound  of the kidney was done also during her admission which showed no acute  finding, small amount of right upper quadrant ascites, otherwise there  was not any hydronephrosis and CAT scan of the abdomen was also done  during her admission which showed small abdominal ascites, small left  pleural effusion and scattered colonic diverticula without any evidence  of diverticulitis, otherwise it was normal.  The patient is today very  stable and ready to go home.   DISCHARGE PHYSICAL EXAMINATION:  Vital Signs: Temperature 98, pulse 70  and respiratory rate 20 and blood pressure is 135/68.  HEENT: She has pink conjunctivae, nonicteric sclera.  NECK is supple.  CHEST: Good air entry bilaterally.  CVS: S1-S2 well heard.  No murmur.  No tachycardia.  ABDOMEN is soft.  Normoactive bowel sounds.  EXTREMITIES: Trace  edema.  CNS: She is alert and fairly oriented.   LABS TODAY:  White blood cell 8.2 and hemoglobin is 8.2 and hematocrit  is 25.4 and platelets is 319. Chemistry, sodium is 145 and potassium is  3.4, chloride is 115 and bicarb is 22, glucose 79, BUN 16, creatinine is  1.6 and calcium is 7.28.   DISCHARGE/PLAN:  The patient will be discharged today and to have follow-  up with PMD in 1 week.  Blood pressure remained very stable without any  hypertensive medications and the lisinopril and hydrochlorothiazide was  on hold for the  mentioned above reason and blood pressure also was  controlled without any medication.  She needs follow-up with  nephrologist and she needs follow-up for her hematocrit and hemoglobin.  She has anemia which is thought to be due to chronic disease, but she  might need GI investigation as well.      Merry Lofty, MD  Electronically Signed     MT/MEDQ  D:  10/31/2006  T:  10/31/2006  Job:  [id number]   cc:   [name], [name]  Fax: [phone]

## 2010-08-09 NOTE — Group Therapy Note (Signed)
[name], BURKHOLDER             ACCOUNT NO.:  [id number]   MEDICAL RECORD NO.:  [id number]          PATIENT TYPE:  INP   LOCATION:  [id number]                          FACILITY:  [facility]   PHYSICIAN:  Salem Caster, DO    DATE OF BIRTH:  1930/12/03   DATE OF PROCEDURE:  10/07/2007  DATE OF DISCHARGE:                                 PROGRESS NOTE   SUBJECTIVE:  Ms. Relford seems to be a little bit more alert today.  The patient has denied any pain, chest pain, abdominal pain or  headaches.  The patient states that she is feeling better.  She seems to  be eating and drinking more also.   OBJECTIVE:  VITAL SIGNS:  Temperature is 98.3, pulse 75, respirations  13, sating 97% on 2L.  HEART:  Regular rate and rhythm.  No murmurs, rubs or gallops.  LUNGS:  Clear to auscultation bilaterally.  No rales, rhonchi or  wheezing.  ABDOMEN:  Soft, nontender, nondistended.  No rigidity or guarding.  EXTREMITIES:  No cyanosis, clubbing or edema.  NEUROLOGICAL:  She is awake and alert.  The patient still confused at  times but less lethargic.   LABORATORY DATA:  Sodium 136, potassium 3.5, chloride 109, CO2 18,  glucose 124, BUN 22, creatinine 1.92.  White count is 11.4, hemoglobin  11.8, hematocrit 35.1, platelet count 230,000.   ASSESSMENT/PLAN:  1. Fatigue and malaise.  Seems to be improving and negative work up so      far.  At this time will continue with physical therapy. Could be      secondary to chronic kidney disease.  2. Renal failure.  This also continues to improve.  We will continue      to follow her BUN and creatinine.  We appreciate nephrology      recommendations.  3. For her anemia, this also seems to be stable.  Will continue to      monitor.  4. Elevated liver function tests, questionable etiology at this time.      The patient continues to be followed by gastroenterology and her      laboratory studies are still pending at this time.  5. Altered mental status changes.   Neurology has been consulted.  They      believe this may be metabolic encephalopathy.  Will continue to      await their further recommendations.  At this time we will continue      with her current care.      Salem Caster, DO  Electronically Signed     SM/MEDQ  D:  10/07/2007  T:  10/07/2007  Job:  [id number]

## 2010-08-09 NOTE — Discharge Summary (Signed)
[name], Alicia [name]             ACCOUNT NO.:  [id number]   MEDICAL RECORD NO.:  [id number]          PATIENT TYPE:  INP   LOCATION:  [id number]                          FACILITY:  [facility]   PHYSICIAN:  [name], MD     DATE OF BIRTH:  Dec 14, 1930   DATE OF ADMISSION:  10/02/2007  DATE OF DISCHARGE:  07/17/2009LH                               DISCHARGE SUMMARY   Please see H&P dictated by Dr. Rhae Hammock for details regarding  patient's presenting illness.   PRIMARY MEDICAL DOCTOR:  [name], [name]   CONSULTATIONS:  1. During this hospitalization, the patient was seen by neurologist,      Dr. Merlene Laughter.  2. Seen by gastroenterology team.  3. Seen by Dr. Tressie Stalker, oncology.  4. By Dr. Lowanda Foster, nephrology.   DISCHARGE DIAGNOSES:  1. Acute on chronic renal failure, improved.  2. Anemia likely secondary to chronic disease with no gastrointestinal      source identified in the past.  3. Weight loss and weakness of unclear etiology.  4. Urinary tract infection, status post treatment.  5. Abnormal elevated transaminases of unclear etiology.   BRIEF HOSPITAL COURSE:  1. Weakness.  This is [name] who      presented with [name] of fatigue and weakness.  She also      reported decreased appetite and has been having weight loss for the      past few months to [name].  The patient was evaluated in the ED      where she was found to have acute renal failure with [name],      creatinine 4.7.  She was also found to have [name]      with [name], pH was 7.20.  The patient's potassium was      normal.  She was also found to have anemia with [name]      which dropped down to 7.5.  Also had elevated white count and was      found to have an UTI.  The reason for the weakness per se is      probably [name].  Her strength has      improved once the above has been corrected.  2. Acute  renal failure.  Dr. Lowanda Foster saw this patient.  Reason for      her renal disease is not very clear.  She does have chronic kidney      disease.  Unclear if she is having poor p.o. intake at home which      precipitated this event of acute renal failure.  In any case, the      patient was put on IV fluids and Lasix.  Her renal function has      come down to baseline of 27 and 1.82 today.  She is making good      urine as well.  The patient also underwent other blood work      including SPEP, immunoglobulin level, etc.  SPEP  in the past has      come back to be mildly abnormal, though no monoclonal component was      identified.  Dr. Tressie Stalker also did not feel that she had multiple      myeloma.  3. Anemia.  Reason for the anemia is likely chronic kidney disease.      She was transfused for [name] that dropped below 8.      Hemoglobin has responded.  She has undergone [name] within      the last couple of years and she did undergo an EGD back in June      which did not reveal any concerning abnormalities in the upper GI      tract.  No untoward GI bleeding has been noted.  The patient was      started on Nu-Iron which she should continue at home.  Iron profile      studies were done which suggested anemia of chronic disease.  B12      level was okay.  4. Altered mental status.  The patient was found to be very confused      when she presented to the hospital.  This is likely [name] result of her      metabolic disturbances.  Dr. Merlene Laughter also felt the same way.  Her      mental status has come back and returned to her baseline actually.  5. Oncological workup.  Because of the weight loss, poor appetite,      weakness and other nonspecific findings on her blood work, Dr.      Tressie Stalker was asked to see the patient.  He did not feel that the      patient had any malignancy.  However, he went ahead and ordered [name]      CAT scan of her chest, abdomen and pelvis all of which were       unremarkable for any metastatic, malignant process or any other      acute process for that matter.  So he does not feel that any      further oncological workup is required.  6. Abnormal transaminases.  The patient's liver function tests have      been abnormal.  AST was 94 and ALT was 127 on October 03, 2007.  They      have been fluctuating quite [name] bit and today they are 129 and 126      respectively.  She has undergone again quite an extensive workup      including [name] CT scan of the abdomen and pelvis which was      unremarkable.  She underwent MRCP which did not show any concerning      abnormalities.  Gastroenterology will see the patient as an      outpatient to decide on future course and to monitor her LFTs.  It      is likely she may end up requiring [name] liver biopsy if her LFTs do      not improved in the next few weeks.  7. Right lower extremity weakness and back pain.  She underwent hip      films which were negative for acute fractures.  Degenerative      disease was noted.  MRI of the lumbar spine was done which did show      severe spinal stenosis in the L4-L5 area and broad-based disk      protrusion was also noted.  This is likely the result for her      symptoms.  However at this time, physical therapy is the only      course for this elderly female.  8. She also had an urinary tract infection and an urine culture showed      E. coli more than 100,000 colonies.  This was sensitive to      ceftriaxone.  The patient has completed almost [name] week of      antibiotics.  I do not think she needs any more at home at this      point.   On the day of discharge, she is feeling better.  She is eating her  breakfast.  No complaints are offered.  She is feeling stronger.  She  did ambulate with [name] walker yesterday.  Her vital signs were all stable.  Blood pressure is running [name] little bit on the lower side, but she is not  on any antihypertensive agents.  She is saturating 95-99% on room  air.  Examination otherwise revealed that lungs were clear to auscultation  bilaterally.  Abdomen was soft, nontender and nondistended.  No  hepatosplenomegaly was appreciated.   Her labs this morning also showed stable findings.  The patient overall  is considered stable for discharge.   DISCHARGE MEDICATIONS:  1. Nu-Iron 115 mg p.o. once daily.  2. KCl 40 mEq p.o. daily.  3. Sodium bicarbonate 650 mg 2 tablets p.o. t.i.d.  4. Caltrate plus vitamin D 1 tablet daily.  5. Centrum Silver 1 tablet daily.  6. Omeprazole 20 mg daily.  7. Megace 1 teaspoon twice [name] day.  8. Aggrenox 1 tablet b.i.d.  9. She is to discontinue her Tricor and her metoprolol for now.   FOLLOW UP:  1. Follow up with Dr. Caron Presume in 1 week.  2. Gastroenterology will be arranging [name] followup for her liver      function tests.  3. We will also arrange an appointment with Dr. Lowanda Foster in about 4      weeks' time.   PLAN:  Home health will be arranged for RN visit, physical therapy and  blood work next week.   DIET:  She can continue with [name] heart-healthy diet.   PHYSICAL ACTIVITY:  She uses [name] cane, so she can continue to do that.  We  will arrange for home health physical therapy.   Total time on this discharge encounter 40 minutes.      [name], MD  Electronically Signed     GK/MEDQ  D:  10/11/2007  T:  10/11/2007  Job:  [id number]   cc:   [name], [name]  Fax: [phone]   Caro Hight, [name]  [address]  [place] , [place] [address]   Alison Murray, [name]  Fax: [place]. Merlene Laughter, [name]  Fax: [place]. Tressie Stalker, MD  Fax: [phone]

## 2010-08-09 NOTE — Consult Note (Signed)
[name], [name]             ACCOUNT NO.:  [id number]   MEDICAL RECORD NO.:  [id number]          PATIENT TYPE:  INP   LOCATION:  A220                          FACILITY:  [facility]   PHYSICIAN:  Alison Murray, M.D.DATE OF BIRTH:  09/26/1930   DATE OF CONSULTATION:  DATE OF DISCHARGE:                                 CONSULTATION   REASON FOR CONSULTATION:  Worsening of renal failure.   Ms. Kable is a 77 year old African American with past medical  history of hypertension, CVA, and hypothyroidism.  Initially, seen in  June and July 2008, and during that time she came with prerenal syndrome  and ATN with creatinine of 15.7, improved, and her creatinine came down  to baseline of 1.7 to 1.8.  Since then, the patient did not come for  followup.  Presently, came back with creatinine of 4.72.  Hence, consult  is called.  At this moment, the only complaint she has is weakness.  She  does not have any nausea or vomiting.  Appetite is not great.  The  patient also denies any weight loss.   PAST MEDICAL HISTORY:  As stated above, the patient with chronic renal  failure, baseline creatinine about 1.5 to 1.8, history of hypertension,  history of iron deficiency anemia, history of CVA, history of  diverticulosis, history of hypothyroidism, history of depression,  history of recurrent urinary tract infection, and history of  degenerative joint disease.   MEDICATIONS:  At this moment consist of:  1. Colace 100 mg p.o. daily.  2. Lovenox 40 mg p.o. subcu q.24 h.  3. Lexapro 5 mg p.o. daily.  4. Tricor 48 mg p.o. daily.  5. Lopressor 25 mg p.o. b.i.d.  6. Protonix 40 mg p.o. daily.  7. K-Dur 40 mEq p.o. daily.  8. IV fluids at 70 mL/h.  9. Darvocet on p.r.n. basis.   ALLERGIES:  She is allergic to SULFA and also CIPRO and also  DIPHENHYDRAMINE, that cause itching.   SOCIAL HISTORY:  No history of smoking.  No history of alcohol abuse.   FAMILY HISTORY:  No history of renal  failure.   REVIEW OF SYSTEMS:  Seems to be mainly weakness and poor appetite.  She  denies any nausea or vomiting.  No diarrhea.  She denies also any recent  urinary tract infection.  She denies any chest pain.  No shortness of  breath, dizziness, or lightheadedness.  She also denies any recent  history of urinary tract infection.   PHYSICAL EXAMINATION:  Temperature is 98.7, blood pressure 144/99, and  pulse of 84.  CHEST:  Clear to auscultation.  No rales.  No rhonchi.  No egophony.  HEART:  Regular rate and rhythm.  No murmur.  ABDOMEN:  Soft and positive bowel sounds.  EXTREMITIES:  No edema.   Her white blood cell count is 11.2, hemoglobin 12.4, and hematocrit  35.8.  Sodium is 149, potassium 3.4, when she came it was 2.9; BUN 75,  creatinine 4.31; when she came BUN was 82, creatinine 4.72, EGFR is 12  mL per minute.  Her calcium is 9.   ASSESSMENT:  1. Renal insufficiency seems to be acute-on-chronic.  Her baseline      creatinine is about 1.7.  Presently, her BUN and creatinine have      increased significantly possibly secondary to dehydration and also      angiotension-converting enzyme inhibitor, presently her BUN and      creatinine slightly seem to be better.  During her last admission,      she had an ultrasound, which showed right kidney to be 11.5, left      kidney 10.9.  At this moment, she does not have any uremic signs      and symptoms.  2. Hypokalemia probably secondary to hydrochlorothiazide.  She is on      potassium supplement.  Potassium seems to be improving.  3. History of hypertension, blood pressure seems to be controlled very      well.  4. History of anemia.  Her hemoglobin and hematocrit have improved      significantly.  She has iron deficiency anemia possibly secondary      to diverticulosis.  5. History of cerebrovascular accident.  6. History of hypothyroidism.  7. History of depression.  8. History of urinary tract infection.  Presently, the  patient states      that she did not get any antibiotics and she did not have any      recent infection.  Her urinalysis showed small leukocyte and few      bacteria with red blood cells 7-10.   RECOMMENDATIONS:  I agree with hydration, probably we will increase her  IV fluid to 110 mL per hour.  I agree with potassium supplement.  We  will check her phosphorus and intact PTH.  At this moment, we will  continue her present management and since she has previous workup and as  we know she has underlying chronic renal failure.  We will hold any  further workup.  We will continue other treatment and we will follow the  patient.  I agree with holding ACE inhibitor and also diuretics.      Alison Murray, [name]  Electronically Signed     BB/MEDQ  D:  07/15/2007  T:  07/15/2007  Job:  [id number]

## 2010-08-09 NOTE — Assessment & Plan Note (Signed)
[name], [name]              CHART#:  [id number]   DATE:  12/25/2006                       DOB:  [birth year]   REFERRING PHYSICIAN:  [name], [name]   PROBLEM LIST:  1. Acute renal failure of unknown etiology in July 2008.  2. Diverticulosis.  3. History of cerebrovascular accident.  4. Hypertension.  5. Depression.  6. Colonoscopy in 2007.  7. Gallstone pancreatitis with subsequent cholecystectomy in 2005.   SUBJECTIVE:  The patient is a 77 year old female who presents as a  return patient visit.  She states she is not enjoying her renal diet.  She has no follow-up with Alison Murray, [name]  She is not having any  nausea, vomiting, or abdominal pain.  She has 2-3 bowel movements a day.  Her appetite is good.  Her daughter states they think the renal failure  was secondary to Cipro.  She denies any heartburn, indigestion,  constipation, diarrhea, or rectal bleeding.  She is not having any black  stools that look like tar.  She does continue on Aggrenox.   MEDICATIONS:  1. Aggrenox twice daily.  2. Lisinopril hydrochlorothiazide 10/12.5 half daily.  3. TriCor daily.  4. Aciphex 20 mg daily.  5. Multivitamin.  6. Phenergan as needed.  7. Tylenol as needed.   OBJECTIVE:  VITAL SIGNS:  Weight 137 pounds (down 12 pounds since July  2008), height 5 feet 3 inches, temperature 97.9, blood pressure 100/60,  pulse 60.  GENERAL:  She is in no apparent distress, alert and oriented x4.  LUNGS:  Clear to auscultation bilaterally.  CARDIOVASCULAR:  Regular rhythm, no murmur, normal S1 and S2.  ABDOMEN:  Bowel sounds are present, soft, nontender, and nondistended.  No rebound or guarding.  EXTREMITIES:  Without edema.   ASSESSMENT:  The patient is a 77 year old female whose nausea, vomiting,  and anorexia in July of 2008 appears to be secondary to uremia.  Her  symptoms are now resolved.  When her hemoglobin was measured in our  office it was 11 in July of 2008.  She may  still need an upper endoscopy  to complete her workup if her anemia persists.  Thank you for allowing  me to see this patient in consultation.   RECOMMENDATIONS:  1. Due to the patient's weight loss and her anemia, I have recommended      that she liberalize her diet.  2. We will obtain labs from Dr. [name] office.  3. We will check her CBC and ferritin today to reevaluate her anemia.      She reports her last labs were over a month ago.  4. She has a follow-up appointment to see me in three months.       Caro Hight, [name]  Electronically Signed     SM/MEDQ  D:  12/25/2006  T:  12/26/2006  Job:  [id number]   cc:   [name], [name]

## 2010-08-09 NOTE — Group Therapy Note (Signed)
[name], [name]             ACCOUNT NO.:  [id number]   MEDICAL RECORD NO.:  [id number]          PATIENT TYPE:  INP   LOCATION:  A204                          FACILITY:  [facility]   PHYSICIAN:  [name], MD     DATE OF BIRTH:  1930-10-12   DATE OF PROCEDURE:  10/23/2006  DATE OF DISCHARGE:                                 PROGRESS NOTE   A 77 year old patient complaining of pain in the back of the left neck.  She is also complaining of some hoarseness in her voice.  Denies any  difficulty swallowing food.  Denies any cough at this time.  Otherwise,  she is feeling actually better.   OBJECTIVE:  Her vital signs show that she had a temperature of 100.1  last night, currently afebrile.  The rest of the vital signs are pretty  stable, saturating well on room air.  LUNGS:  Reveal a few crackles at the right base, which disappeared with  deep breathing.  CARDIOVASCULAR:  S1, S2 normal, regular.  No murmurs appreciated.  ABDOMEN:  Soft, nontender, nondistended.  Bowel sounds are present.  No  mass or organomegaly appreciated.  EXTREMITIES:  No edema bilaterally.   LABORATORY DATA:  Her white count is normal.  Hemoglobin is stable at  10.5, platelet count 541,000.  BMET shows that her BUN has improved only  a little bit to 81.  Creatinine is improved to 11.5.  Her phosphors  level is still 7.7.  Intact PT is high at 317, total calcium at that  time was 7.5.  C4, C3 normal.  Total albumin slightly elevated.  SPEP  was done.  Interpretation is still pending, that show high kappa light  chains, lambda light chains.   ECHOCARDIOGRAM:  Shows a normal EF.  No other significant abnormalities  appreciated.  There was mild to moderate MR that was noted.  No  pericardial effusion noted.   ASSESSMENT AND PLAN:  1. Acute renal failure of unclear etiology.  The patient is improving      slowly.  She is being managed primarily by the nephrologist at this      time.  The etiology, as I said,  is unclear.  It could have been      medications.  I believe we would like to obtain a renal biopsy, but      I would defer this issue to the nephrologist.  Potassium is better.  2. Anemia, likely from chronic disease.  Stable at this time.  No need      for blood transfusion.  3. Hypertension.  Stable.  We are currently holding some of her      antihypertensive agents, and these may need to be reinitiated if      her blood pressure starts going up.  4. She also was found to have pneumonia and atelectasis based on x-      ray.  She is on Levaquin day 3 today.  This may be discontinued      after 5 days.  5. Deep vein thrombosis and gastrointestinal prophylaxis are ongoing.   DISPOSITION:  Renal function has to improve significantly before this  patient can be discharged.  We will await recommendations from the  nephrologist regarding her discharge planning.  Regarding her hoarse  voice, I think we will try some Chloraseptic spray today.  If these  issues do not resolve, this can be followed up as an outpatient.  The  neck pain is likely from her lying on the bed.  She did have some pain  when she flexed and rotated her neck.  Will  continue to follow, give her pain medications as noted.  If this does  not improve the neck pain, then she may need muscle relaxants.   The patient is a Full Code at this time. She had a Midline placed  yesterday.      [name], MD  Electronically Signed     GK/MEDQ  D:  10/23/2006  T:  10/23/2006  Job:  [id number]

## 2010-08-09 NOTE — Consult Note (Signed)
[name], [name]             ACCOUNT NO.:  [id number]   MEDICAL RECORD NO.:  [id number]          PATIENT TYPE:  INP   LOCATION:  A314                          FACILITY:  [facility]   PHYSICIAN:  Gaston Islam. Neijstrom, MD  DATE OF BIRTH:  Apr 21, 1930   DATE OF CONSULTATION:  10/03/2007  DATE OF DISCHARGE:                                 CONSULTATION   DIAGNOSES:  1. Severe weakness easy fatigability  2. Probable weight loss.  3. Anemia.  4. Renal insufficiency.  5. History of a previous stroke,  6. History of a diverticulitis in the past.  7. History of hypertension.  8. Dementia.   HISTORY:  This is a 77 year old African American woman who remembers  very low of any details about her past life, let alone was going on  right now.  What she does remember is that she is 77 years old, she  knows her date of birth, she knows that she lives with her son and his  son and she knows that her son who she lives with is divorced.  She  knows that her husband died, she thinks 69 or 9 years ago and she does  not remember what he died from.  She does not remember the year, she  does not remember the month, she does not know the present [place], she does not know anything special about the present [place], she cannot remember whether she voted for the present [place].  She cannot remember her date of marriage to her one and only  husband.   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  She states and  cannot remember when she retired.  She denies smoking or alcohol use.   She does not remember any operations.   Her admission vital signs showed that she was not afebrile.  Her blood  pressure was 127/61 and it remains about that today.   Her weight is 67 kg, her height is 5 feet 2 inches and I cannot find  previous weights or heights and an old chart unavailable to me at this  time.   Her labs upon this admission showed that she was anemic with a  hemoglobin 9.9,  MCV was actually normal, white count 11,100 platelets  274,000 and a differential showed a slight left shift.  Her B-MET at  this time showed a BUN of 64 and a creatinine 4.73.  Her potassium was  normal on admission.  Her CO2 was low at 8 interestingly, consistent  with her metabolic acidosis, calcium however was normal at 9.7.  A TSH  done yesterday was unremarkable.  A CBC done today shows that her  hemoglobin is 8.1, white count 9,800, MCV still normal at 95, platelets  240,000, RDW 17.4 which is elevated and a differential was unremarkable  at this time.  PT was 17.2, INR of 1.3, PTT was 30.  Her CMP today  showed a potassium of 2.7, CO2 was up to 15, BUN 58, creatinine down to  3.95 but interestingly her total bili was 1.8, alk phos  was low at 32,  SGOT high at 94, and SGPT high at 127.   I have a CMP to compare that with from Aug 03, 2007, which shows a BUN of  48, creatinine 3.54, so she is possibly back at baseline but at that  time, her bilirubin was only 0.9, alkaline phosphatase 37, SGOT and SGPT  were both normal at that time at 19 and 22 respectively.   It was also during that admission that she had some urine studies  including electrophoresis, Bence-Jones proteinuria was negative at that  time and a serum protein electrophoresis did not reveal that she had an  M-spike whatsoever.   She also had an alpha fetoprotein on that admission which was less than  1.3 and a CEA was 1.0.  An anemia panel at that time did not reveal that  she had B12 deficiency nor did she have folic acid deficiency.  Her  ferritin was 321 which is high interestingly.  Retic count 2.2% low for  her degree of hemoglobin and a reticulocyte count absolute was only  65,000.  Her serum iron was 25, TIBC 252, percent saturation 10%  consistent with a picture of anemia of chronic disease.   This lady has had a CT of her brain in the past which supposedly  revealed an old stroke.   She could not give me a  history of alcohol use, tobacco use, and was  unclear on any surgery in the past even though she has a history of a  cholecystectomy.   Her exam, as mentioned, she is afebrile.  Blood pressure is okay at this time, pulse right around 80 and regular,  respiratory rate 16 and unlabored.  She has no adenopathy.  Her lungs are clear.  Her heart shows a regular rhythm and rate without murmur or gallop.  Breast exam shows no masses.  There is no obvious hepatosplenomegaly.  Bowel sounds are diminished.  She does not have edema of her legs.  She has decreased pulses in her  feet; however, she has an upper dental plate some remaining lower teeth.  Tongue is unremarkable.  Pupils show arcus senilis and she has such  dementia at this time it is fairly impressive.   So I think we need to do a further workup on her including are RPR and a  repeat anemia panel.  At this time, I think she needs CAT scans to make  sure nothing else is going on.  She had some CAT scans last year to  compare these with and we will see what they show but I think her  dementia needs further characterization.  She certainly has a picture of  anemia of chronic disease at the very least      Gaston Islam. Tressie Stalker, MD  Electronically Signed     ESN/MEDQ  D:  10/03/2007  T:  10/04/2007  Job:  [id number]   cc:   Alison Murray, [name]  Fax: [phone]   Salem Caster, DO   [name], [name]  Fax: [phone]

## 2010-08-09 NOTE — Consult Note (Signed)
[name], [name]             ACCOUNT NO.:  [id number]   MEDICAL RECORD NO.:  [id number]          PATIENT TYPE:  INP   LOCATION:  [id number]                          FACILITY:  [facility]   PHYSICIAN:  Kofi A. Merlene Laughter, [name] DATE OF BIRTH:  [birth year]   DATE OF CONSULTATION:  DATE OF DISCHARGE:                                 CONSULTATION   FOLLOW-UP NOTE   PHYSICAL EXAMINATION:  Patient is awake and alert.  She does not report  any new complaints.  Temperature 98.1, pulse 78, respirations 18, blood pressure 99/64.  Patient is awake and alert.  She is incoherent.  She follows commands  well.   Homocysteine level is 12.4.  Vitamin B12 is still pending.   ASSESSMENT/PLAN:  Resolved nephropathy, likely due to toxic metabolic  cause.  Continue current care.  Will follow up the B12 level.      Kofi A. Merlene Laughter, [name]  Electronically Signed     KAD/MEDQ  D:  10/08/2007  T:  10/08/2007  Job:  [id number]

## 2010-08-09 NOTE — Consult Note (Signed)
[name], ACCURSO             ACCOUNT NO.:  [id number]   MEDICAL RECORD NO.:  [id number]          PATIENT TYPE:  INP   LOCATION:  [id number]                          FACILITY:  [facility]   PHYSICIAN:  Alison Murray, M.D.DATE OF BIRTH:  [birth year]   DATE OF CONSULTATION:  DATE OF DISCHARGE:                                 CONSULTATION   REASON FOR CONSULT:  Worsening renal failure.   HISTORY:  This is one of a multiple medical admission for [name]  who has chronic renal failure with baseline creatinine between 1.6 and  2, presently came with creatinine above 4, low CO2, and hence, since the  consult is called.  [name] for some reason has poor appetite,  weight loss, and also she goes to recurrent dehydration with elevated  BUN and creatinine.  The patient denies any nausea, vomiting, but her  appetite is still poor.  She states that she is drinking enough.  She  denies any dizziness, but she feels still very weak.  During her last  admission, the patient came with similar history, hydrated, BUN and  creatinine improved, and discharged to home.  After her discharge, the  patient did not come to our office; hence after her discharge, this is  also the first time she came to the hospital.  Previously, the patient  was seen in the office and also in the hospital.  At this moment, she  does not have any fever, chills, or sweating.   PAST MEDICAL HISTORY:  She has history of CVA, history of  hypothyroidism, history of iron deficiency anemia, history of  depression, history of recurrent acute on chronic renal failure, history  of degenerative joint disease, history of diverticulosis, and history of  hypokalemia, and also she has a distinct history of secondary  hyperparathyroidism.  The patient also has history of hypertension and  proteinuria.   MEDICATIONS:  1. At this moment consist of Rocephin 1 g IV q.24 h.  2. Protonix 40 mg p.o. daily.  3. Potassium 40 mEq  p.o.  daily.  4. She is also getting some potassium as well as IV.  Other      medications are p.r.n. medication.   ALLERGIES:  She is allergic to CIPRO.  Allergic to SULFA.  Allergic also  to DIPHENHYDRAMINE.   SOCIAL HISTORY:  No history of smoking.  No history of alcohol abuse.  The patient lives at home.  She states that her son and other families  live with her.   REVIEW OF SYSTEMS:  Her main complaint seems to be weakness, otherwise  she does not have any nausea or vomiting.  No chest pain.  No fevers,  chills, or sweating.  She states that she has this weakness for the last  2 weeks.  She does not have any nausea or vomiting.  Her appetite is not  that good, but she tries to drink enough water according to her.  She  does not have any shortness of breath.   PHYSICAL EXAMINATION:  GENERAL:  The patient at this moment is alert and  in no apparent distress.  VITAL SIGNS:  Temperature is 98.4, pulse of 98, and blood pressure  122/57.  She has about 800 mL in.  CHEST:  Clear to auscultation.  No rales.  No rhonchi.  No egophony.  HEART:  Regular rate and rhythm.  No murmur.  ABDOMEN:  Soft.  Positive bowel sound.  EXTREMITIES:  No edema.   LABORATORY DATA:  Her ABG, pH is 7.3 and PCO2 of 17.2.  White blood cell  count is 9.8, hemoglobin 8.1, and hematocrit 24.5.  When she came, her  white blood cell count is 11.1, hemoglobin 9.9, and hematocrit 30.8.  Her PT is 17.2 and INR is 1.3.  Sodium is 146 and potassium 2.7.  When  she came, her potassium was 5.6, her CO2 was 15, and her BUN was 58, and  creatinine was 3.95.  When she came, BUN was 68 and creatinine 4.7.  Last time, when she was discharged in May, her creatinine was as low as  2.2.  Her bilirubin is 1.8, alkaline phosphatase is 32, SGOT is 94, SGPT  is 127, and albumin is 2.8.  During her last admission, the  immunoelectrophoresis for the urine showed she has a alpha 1 in the  serum and she has gammaglobulin also in the serum.   This is a little bit  new from the last one when it was checked, she did not have the  gammaglobulin, but then she did not have the serum spike during the last  time, but there was a questionable band.   ASSESSMENT:  1. Renal insufficiency, at this moment seems to be acute on chronic.      Creatinine seems to be coming down still, possibly from      superimposed prerenal syndrome.  Still her appetite is poor, but      does not seem to be related to her renal infarction because she had      this problem for some time.  2. Metabolic acidosis.  Her CO2 seems to be improving, also TSH seems      to be improving.  3. Hypokalemia.  Probably from renal loss.  When she came, her      potassium okay, and presently, seems to be going down.  She is on      potassium supplement.  4. Increased in LFTs.  This seems to be new etiology at this moment is      not clear.  5. Hypoalbuminemia.  6. History of cerebrovascular accident.  7. History of poor appetite, but without nausea, vomiting, and she has      also history of weight loss, no wheeze.  There are monoclonal      antibodies found in her blood, especially the gamma range and also      alpha.  We need to rule out hematological malignancies such as      multiple myeloma and other disease.  8. History of hypertension.  Blood pressures seems to be controlled      well.  9. History of anemia.  Iron deficiency.  Her hemoglobin and hematocrit      seems to be declining.  10.History of hypernatremia.  Probably, it may be from the IV fluids      the patient is getting.   RECOMMENDATION:  We will change her IV fluid to D5 water and we will put  her on sodium bicarb 650 mg p.o. b.i.d. and we will increase her  potassium to 40 mEq p.o. b.i.d.  At  this moment, as stated in my note  previously, I need to consider Oncology consult.  Probably, doing  ultrasound of the abdomen may be reasonable or probably GI consult may  need to be sought because of  elevated liver function test.  Checking  also the patient for hepatitis B and hepatitis C is not unreasonable.      Alison Murray, [name]  Electronically Signed     BB/MEDQ  D:  10/03/2007  T:  10/04/2007  Job:  [id number]

## 2010-08-12 NOTE — Op Note (Signed)
[name], [name]                       ACCOUNT NO.:  [id number]   MEDICAL RECORD NO.:  [id number]                   PATIENT TYPE:  INP   LOCATION:  A306                                 FACILITY:  [facility]   PHYSICIAN:  Jamesetta So, [name]               DATE OF BIRTH:  08/02/1930   DATE OF PROCEDURE:  10/28/2003  DATE OF DISCHARGE:                                 OPERATIVE REPORT   PREOPERATIVE DIAGNOSES:  1. Gallstone pancreatitis.  2. Cholecystitis,  3. Cholelithiasis.   POSTOPERATIVE DIAGNOSES:  1. Gallstone pancreatitis.  2. Cholecystitis,  3  Cholelithiasis.   PROCEDURE:  Laparoscopic cholecystectomy.   SURGEON:  Jamesetta So, [name]   ANESTHESIA:  General endotracheal.   INDICATIONS:  The patient is a 77 year old black female who presents with  acute cholecystitis secondary to cholelithiasis.  She also had evidence of  gallstone pancreatitis.  She does have cholelithiasis.  Hepatobiliary scan  performed yesterday reveals acute cholecystitis with cystic duct  obstruction.  The risks and benefits of the procedure including bleeding,  infection, cardiopulmonary difficulties, CVA, and the possibility of an open  procedure were fully explained to the patient, who gave informed consent.   PROCEDURE NOTE:  The patient was placed in the supine position.  After  induction of general endotracheal anesthesia, the abdomen was prepped and  draped using the usual sterile technique with Betadine.  Surgical site  confirmation was performed.   A supraumbilical incision was made down to the fascia.  A Veress needle was  introduced into the abdominal cavity and confirmation of placement was done  using the saline drop test.  The abdomen was then insufflated to 16 mmHg  pressure.  An 11 mm trocar was introduced into the abdominal cavity under  direct visualization without difficulty.  The patient was placed in the  reverse Trendelenburg position, an additional 11 mm trocar was  placed in the  epigastric region and a 5 mm trocar was placed in the right upper quadrant  and right flank regions.  The liver was inspected and noted to be within  normal limits.  The gallbladder was noted to be acutely inflamed and  distended.  A thickened gallbladder wall was noted.  Needle aspiration of  the lumen of the gallbladder revealed hydrops.  The intraluminal contents  were aspirated without difficulty.  The gallbladder was then retracted  superiorly and laterally.  The dissection was begun around the infundibulum  of the gallbladder.  The cystic duct was first identified.  It's junction to  the infundibulum fully identified.  A single Endoclip was placed proximally  on the cystic duct.  An incision was made into the cystic duct, but the  cystic duct was noted to be sclerotic in nature.  Thus, multiple Endoclips  were placed distally on the cystic duct and the cystic duct was divided.  The cystic artery was likewise ligated and divided.  The  gallbladder was  freed away from the gallbladder fossa using Bovie electrocautery.  The  gallbladder was delivered through the epigastric trocar site using an endo  catch bag.  The gallbladder fossa was inspected, no abnormal bleeding or  bile leakage was noted.  Surgicel was placed in the gallbladder fossa.  All  fluid and air were then evacuated from the abdominal cavity prior to removal  of the trocars.   All wounds were irrigated with normal saline.  All wounds were injected with  0.5% Sensorcaine.  The supraumbilical fascia as well as epigastric fascia  were reapproximated using an 0 Vicryl interrupted suture.  All skin  incisions were closed using staples.  Betadine ointment and a dry, sterile  dressing were applied.   All tape and needle counts correct at the end of the procedure.  The patient  was extubated in the operating room and went back to the recovery room  awake, in stable condition.   COMPLICATIONS:  None.    SPECIMEN:  Gallbladder with stones.   BLOOD LOSS:  Minimal.      ___________________________________________                                            Jamesetta So, [name]   MAJ/MEDQ  D:  10/28/2003  T:  10/28/2003  Job:  [id number]   cc:   [name], [name]  [address], [place] [address]  Fax: [phone]

## 2010-08-12 NOTE — Discharge Summary (Signed)
[name], [name]                       ACCOUNT NO.:  [id number]   MEDICAL RECORD NO.:  [id number]                   PATIENT TYPE:  INP   LOCATION:  A306                                 FACILITY:  [facility]   PHYSICIAN:  Jamesetta So, [name]               DATE OF BIRTH:  1930/07/28   DATE OF ADMISSION:  10/26/2003  DATE OF DISCHARGE:  10/30/2003                                 DISCHARGE SUMMARY   HOSPITAL COURSE SUMMARY:  The patient is a 77 year old black female who  presented to [facility] with right upper-quadrant abdominal pain  and nausea.  She had been seen in the emergency room several days earlier.  Ultrasound of the gallbladder revealed acute cholecystitis with  cholelithiasis.  She had evidence of gallstone pancreatitis though her  pancreatic enzymes were normalizing.  A surgery consultation was obtained,  and the patient was taken to the operating room on October 28, 2003, and  underwent laparoscopic cholecystectomy.  She was noted to have an acutely  inflamed gallbladder.  Of note was the fact that she also had Klebsiella  urinary tract infection.  Her initial postoperative course was remarkable  for fevers which have subsequently resolved over the last 24 hours.  Her  diet was advanced without difficulty.  The patient is being discharged home  on postoperative day #2 in good and improving condition.   FOLLOWUP:  The patient is to follow up with Dr. Aviva Signs on November 05, 2003.   DISCHARGE MEDICATIONS:  1. Darvocet-N 100, one to two tablets p.o. q.6h. p.r.n. pain.  2. Ciprofloxacin 500 mg p.o. b.i.d. x5 days.  3. She is to resume all of her other medications as previously prescribed.   PRINCIPAL DIAGNOSES:  1. Cholecystitis, cholelithiasis.  2. Gallstone pancreatitis.  3. Urinary tract infection.  4. History of cerebrovascular accident.  5. Hypertension.   PRINCIPAL PROCEDURES:  Laparoscopic cholecystectomy on October 28, 2003.     ___________________________________________                                         Jamesetta So, [name]   MAJ/MEDQ  D:  10/30/2003  T:  10/30/2003  Job:  [id number]   cc:   [name], [name]  [address] [place] [address]  Fax: [phone]

## 2010-08-12 NOTE — Assessment & Plan Note (Signed)
HISTORY OF PRESENT ILLNESS:  Ms. Fails returns to the clinic today for  follow up evaluation, accompanied by her daughter.  The patient is a 77-year-  old [place] American female with past medical history significant for  hypertension and degenerative joint disease.  She was found April 12, 2003, with gait problems and difficulty speaking.  Head CT revealed no acute  abnormalities.  MRI scan study revealed an acute infarct of the left  thalamus and a posterior limb of the internal capsule.   The patient was placed on Aggrenox for stroke prophylaxis.  She eventually  stabilized and was moved to the subacute unit April 17, 2003.  She remains  there through discharge April 29, 2003.   Since discharged home, the patient has been receiving some help from her  family.  She initially received home health physical and occupational  therapy, but that has stopped at this point.  The patient has returned to  see her primary care physician, Dr. Orson Ape.  He could not find any evidence  of stroke when he evaluated her.  She does have some mental status changes,  but those have been chronic over the past couple of years.  She is bathing  and dressing independently and toileting independently.  She also feeds  herself independently.  She uses no assisted device during ambulation.  She  does need some cues and has family members manage her medications at this  time.   The patient's daughter reports that Dr. Orson Ape has not mentioned starting  any anti-dementia drugs such as Aricept in the past.   MEDICATIONS:  1. Tri-Chlor 54 mg daily.  2. Aggrenox 1 tablet b.i.d.  3. Tenex 2 mg daily.  4. Lodine 40 mg b.i.d.   PHYSICAL EXAMINATION:  GENERAL APPEARANCE:  Well-appearing middle aged  elderly adult female in no acute distress.  VITAL SIGNS:  Blood pressure 141/89 with a pulse of 70, respirations 16, O2  saturation 97% on room air.  NEUROLOGICAL:  She has strength of 5-/5 throughout the  bilateral upper and  lower extremities.  Bulk and tone were normal.  Reflexes were 2+ and  symmetrical.  The patient used no assisted device during ambulation.  She  does have some slower processing and needs occasional cues, even for manual  muscle testing in parts of the exam.   IMPRESSION:  1. Status post __________ and left posterior limb, internal capsule stroke     with mild deficits including decreased cognition.  2. Hypertension.  3. Elevated triglyceride on medication.  4. Mild dementia.   PLAN:  At the present time, I will plan on seeing the patient in follow up  on an as needed basis.  She is doing extremely well at this time and is  independent with all self cares.  She only needs monitoring of her  medications and occasional monitoring for safety.  She is not living on her  own but has frequent family visitors daily.   I plan on seeing her in follow up on an as needed basis.     Jarvis Morgan, [name]   DC/MedQ  D:  05/29/2003 10:14:18  T:  05/29/2003 10:52:41  Job #:  [id number]

## 2010-08-12 NOTE — H&P (Signed)
NAMEMAKINA, SHOOTS             ACCOUNT NO.:  [id number]   MEDICAL RECORD NO.:  [id number]          PATIENT TYPE:  AMB   LOCATION:  DAY                           FACILITY:  [facility]   PHYSICIAN:  Jamesetta So, [name]  DATE OF BIRTH:  19-Jun-1930   DATE OF ADMISSION:  DATE OF DISCHARGE:  LH                                HISTORY & PHYSICAL   CHIEF COMPLAINT:  Need for screening colonoscopy.   HISTORY OF PRESENT ILLNESS:  The patient is a 77 year old black female who  was referred for endoscopic evaluation.  She needs a colonoscopy for  screening purposes.  No abdominal pain, weight loss, nausea, vomiting,  diarrhea, constipation, melena, or hematochezia have been noted.  She has  never had a colonoscopy.  There is no family history of colon carcinoma.   PAST MEDICAL HISTORY:  1. Includes a UTI.  2. History of a CVA.  3. Hypertension.   PAST SURGICAL HISTORY:  Laparoscopic cholecystectomy in 2005.   CURRENT MEDICATIONS:  1. TriCor 1 tablet p.o. every day.  2. Aggrenox 1 tablet p.o. b.i.d.  3. Lisinopril/hydrochlorothiazide 1 tablet p.o. every day.  4. Lexapro 5 mg p.o. every day.   ALLERGIES:  NO KNOWN DRUG ALLERGIES.   REVIEW OF SYSTEMS:  Noncontributory.   PHYSICAL EXAMINATION:  GENERAL:  The patient is a well-developed, well-  nourished black female in no acute distress.  LUNGS:  Clear to auscultation with equal breath sounds bilaterally.  HEART:  Reveals a regular rate and rhythm without S3, S4, or murmurs.  ABDOMEN:  Soft, nontender, nondistended.  No hepatosplenomegaly or masses  noted.  RECTAL:  Deferred to the procedure.   IMPRESSION:  Need for screening colonoscopy.   PLAN:  The patient is scheduled for a colonoscopy on January 23, 2006.  The  risks and benefits of the procedure including bleeding and perforation were  fully explained to the patient, gave informed consent.      Jamesetta So, [name]  Electronically Signed     MAJ/MEDQ  D:  01/18/2006  T:   01/19/2006  Job:  [id number]   cc:   [facility]  Fax: [phone]   [name], [name]  Fax: [phone]

## 2010-08-12 NOTE — H&P (Signed)
NAMEDENNIS, LUGAR                         ACCOUNT NO.:  [id number]   MEDICAL RECORD NO.:  [id number]                   PATIENT TYPE:  INP   LOCATION:  [place]                                 FACILITY:  [facility]   PHYSICIAN:  Catherine A. Jacolyn Reedy, [name]          DATE OF BIRTH:  [birth year]   DATE OF ADMISSION:  04/12/2003  DATE OF DISCHARGE:                                HISTORY & PHYSICAL   CHIEF COMPLAINT:  Speech unintelligible and face drawn.   HISTORY OF PRESENT ILLNESS:  Ms. Gaff is a 77 year old right-handed  black woman, who is a patient of Dr. Satira Anis. McGough.  She has a history  of hypertension and has been on Norvasc.  She is generally independent, in  good health, and normal mentation.  Today, some time between 3:30 and 4:30  she developed language disturbance.  From the family's description, it  sounds more like aphasia then dysarthria but that is not clear.  The son  describes that she was staggering a little bit but did not feel that she was  more specifically weak to one side than the other.  He did feel like her  left face was drawn up, although that could mean that her right face was  drawn down.  She was brought to the emergency room where she cleared shortly  thereafter.  She was sleepy after a CAT scan and the speech was initially  confused again but improved as she aroused.   REVIEW OF SYMPTOMS:  Positive for recent sinus infection, chronic headaches,  left knee pain.  No chest pain or shortness of breath.   PAST MEDICAL HISTORY:  1. Significant for hypertension.  2. Chronic left knee pain.  3. Recent sinus infection.  4. No history of coronary artery disease, diabetes, or surgery.   MEDICATIONS:  1. Norvasc.  2. Lodine.  3. Tylenol No. 3.  4. Tenex.  5. Doxycycline with one more day left in the course.   ALLERGIES:  No known drug allergies.   SOCIAL HISTORY:  She is fairly independent.  Lives with one of her sons.  Does not smoke or drink  alcohol.   FAMILY HISTORY:  Noncontributory.   PHYSICAL EXAMINATION:  VITAL SIGNS:  Temperature is 97.4, pulse is 40,  respirations 14, blood pressure 119/67.  Saturation 100% on two liters.  HEENT:  Head is normocephalic and atraumatic.  NECK:  Supple without bruits.  HEART:  Regular rate and rhythm.  LUNGS:  Clear to auscultation.  ABDOMEN:  Positive bowel sounds.  Nontender and soft.  EXTREMITIES:  Without edema.  NEUROLOGICAL:  Mental status shows she is sleepy but will arouse with some  stimulation. Normal language, naming, repetition, and comprehension.  She  knows her age and the month.  She has somewhat slow response time.  Cranial  nerves show pupils are equal and reactive.  She blinks to threat.  Extraocular movements intact with some limitation of down  gaze.  Facial  sensation appears to be normal.  It looks like she has a very slight right  facial droop to me.  Hearing is intact.  Palate is symmetric.  Tongue is  midline.  Fine motor exam shows no drift in upper or lower extremities.  Fairly normal grip.  She is not fully cooperative but no focal abnormalities  are seen.  Deep tendon reflexes are trace.  Downgoing toes to plantar  stimulation or coordination.  Finger-to-nose and heel-to-shin are intact.  Sensory exam is intact in all fours.   LABORATORY DATA:  EKG shows marked sinus bradycardia with the rate in the  40s which does not appear to be symptomatic.   PT is 13.1, INR 1.0, white blood cell count 7.8, hemoglobin 12.4, hematocrit  37.8, platelets 327,000.  Sodium 141, potassium 4.0, chloride 108, CO2 25.2,  BUN 15, creatinine 0.7, glucose 110.  CT scan of the brain is essentially  normal for age.   IMPRESSION:  Transient ischemic attack versus stroke:  She appears to have  suffered aphasia with slight right facial droop which suggest left middle  cerebral artery.  It could have been dysarthric.  It is a little bit  difficult to tell.  Her level of alertness  waxing and wanes.   PLAN:  Admit to monitored bed.  Hold her Norvasc for now.  Add aspirin.  Check MRI scan of  the brain, carotid Doppler, and 2-D echocardiogram.                                                Flint Melter. Jacolyn Reedy, [name]    CAW/MEDQ  D:  04/12/2003  T:  04/12/2003  Job:  [id number]

## 2010-08-12 NOTE — H&P (Signed)
[name], REHMERT                       ACCOUNT NO.:  [id number]   MEDICAL RECORD NO.:  [id number]                   PATIENT TYPE:  INP   LOCATION:  A306                                 FACILITY:  [facility]   PHYSICIAN:  [name], [name]                 DATE OF BIRTH:  25-Mar-1931   DATE OF ADMISSION:  10/26/2003  DATE OF DISCHARGE:                                HISTORY & PHYSICAL   CHIEF COMPLAINT:  Abdominal pain, fever, and nausea.   HISTORY OF PRESENT ILLNESS:  This is a 77 year old female with a history of  hypertension, gastroesophageal reflux disease, diffuse osteoarthritis.  She  is status post left thalamic and left posterior limb of the internal capsule  CVA in January 2005.   The patient was brought to the emergency department approximately 36 hours  prior to presenting to the office with nausea and abdominal pain.  She was  found to have urinary tract infection, as well as a mild elevation of her  amylase.  She was treated with Cipro 500 mg, Bentyl 20 mg, and Phenergan  p.r.n., and instructed to come to the office today.   In the office, the patient was found to be febrile and mildly dehydrated.  She had right upper quadrant abdominal tenderness.  Her urinalysis revealed  bilirubinuria and moderate white and red cells.   The patient's clinical picture is compatible with cholecystitis.  She was  sent for a stat ultrasound which revealed the presence of gallstones, but no  signs of acute cholecystitis.  White count is approximately 18,000 with a  left shift, and she has mild elevation of her liver function's with a total  bilirubin of 2, direct of 0.8, indirect of 1.2.  Mild elevation in  transaminases with an ALT of 38.   The patient is admitted with probably acute cholecystitis, rule out other.  There is no history of headache.  No chest pain, shortness of breath,  melena, hematemesis, or hematochezia, or further genitourinary symptoms.   CURRENT MEDICATIONS:  1. Aggrenox 200/50 one b.i.d.  2. Lodine 400 mg b.i.d.  3. Tricor 48 mg q.h.s.  4. Tenax 2 mg one daily.   ALLERGIES:  1. PENICILLIN.  2. ASPIRIN.   PAST MEDICAL HISTORY:  Reviewed above.   FAMILY HISTORY:  Positive for gallbladder disease in her sister.  Also  significant for cancer of questionable primary in both parents.   REVIEW OF SYSTEMS:  Negative except as mentioned.   PHYSICAL EXAMINATION:  GENERAL:  This is a very pleasant female who is in  mild distress only.  She is alert and oriented.  VITAL SIGNS:  Temperature 100.4, heart rate is approximately 80,  respirations 18 and unlabored, blood pressure 109/60.  HEENT:  Normocephalic, atraumatic.  Mucous membranes are mildly dehydrated.  There is no scleral icterus.  NECK:  Supple, no audible bruits.  LUNGS:  Clear to auscultation and percussion.  HEART:  Heart sounds are somewhat distant.  No murmurs, rubs, or gallops.  ABDOMEN:  Mild diffuse tenderness, most pronounced at the right upper  quadrant.  She also has some mild left lower quadrant pain, but no guarding  or rebound.  Murphy's sign is positive.  EXTREMITIES:  No cyanosis, clubbing, or edema.  NEUROLOGIC:  Essentially normal.   ASSESSMENT:  Probable cholecystitis, consider other.  She is on non-  steroidals as well as Aggrenox, but apparently has no history of  gastrointestinal bleeding.  Elevation of liver function's, etc., is  compatible with cholecystitis.  Of note, repeat amylase was normal.   PLAN:  Dr. Arnoldo Morale will see in consult.  HIDA scan, Helicobacter pylori, and  repeat blood work, and treat with Rocephin empirically.  We will follow and  treat respectively.     ___________________________________________                                         [name], [name]   MC/MEDQ  D:  10/26/2003  T:  10/26/2003  Job:  [id number]

## 2010-08-12 NOTE — Discharge Summary (Signed)
NAMEKAHLAYA, BANKEN                         ACCOUNT NO.:  [id number]   MEDICAL RECORD NO.:  [id number]                   PATIENT TYPE:  ORB   LOCATION:                                       FACILITY:  [facility]   PHYSICIAN:  Jarvis Morgan, [name]                DATE OF BIRTH:  May 14, 1930   DATE OF ADMISSION:  04/17/2003  DATE OF DISCHARGE:  04/29/2003                                 DISCHARGE SUMMARY   DISCHARGE DIAGNOSES:  1. Left thalamus and left posterior limb of the internal capsule     cerebrovascular accident.  2. Hypertension.  3. Elevated triglycerides.  4. Status post urinary tract infection.   HISTORY OF PRESENT ILLNESS:  The patient is a 77 year old black female,  right handed, with past medical history of hypertension and DJD found on  April 12, 2003 with difficulty speaking and gait problems. Chest CT on  April 12, 2003 revealed no acute intracranial abnormalities. MRI performed  revealed an acute infarct in the left thalamus and left posterior limb  internal capsule. Carotid Dopplers note an ICA stenosis. Presently on  Aggrenox for CVA prophylaxis. Followed by neurology, Dr. Leonie Man. Physical  therapy report  at this time revealed patient is ambulating min assist 100  feet, hand held assist, no device, and transfers sit to stand min assist.  The patient had no significant hospital complications. Primary care  physician is Dr. Orson Ape.   PAST MEDICAL HISTORY:  1. Hypertension.  2. Left total knee repair.  3. Recent sinus infection.   PAST SURGICAL HISTORY:  None.   MEDICATIONS PRIOR TO ADMISSION:  1. Lodine 40 mg b.i.d.  2. Tenex 2 mg q.h.s.   REVIEW OF SYSTEMS:  Weakness, joint pain, and joint swelling. Urgency and  incontinence.   SOCIAL HISTORY:  The patient lives with son and grandson in one level home.  Four to five steps to entry. She was independent prior to admission. She  denies any tobacco or alcohol use. She has a large supportive family of six  children.   HOSPITAL COURSE:  Ms. Litonya Droege was admitted to [facility] rehab  department on April 17, 2003 where she received more than an hour of  therapy daily. Overall, Ms. Bridge has progressed very well from a  physical standpoint. She is able to ambulate approximately 300 feet  supervision level with no assistive device. Able to transfer to sit to stand  modified independently and bed mobility modified independently. Able to  perform most ADLs supervision to modified independent. She had no  significant medication complications while in rehab. The day prior to  discharge, she did slightly have an elevated fever. We noted the patient did  have a urinary tract infection. She was started on Cipro 250 mg b.i.d. for 7  days. Urine culture revealed patient had proteus and Escherichia coli  greater than 100,000 colonies in urine culture. Nevertheless, the next  day  prior to discharge, she still had a slight fever. Repeat urinalysis and  urine culture will be performed on an outpatient level. Overall, besides the  UTI and sedation-lethargy, she had no other significant complications while  in rehab. The patient still had slight decreased cognition and decrease in  memory deficits. From a physical standpoint, she has improved significantly.  There were no other major issues that occurred while patient was on rehab.  Latest labs indicated latest hemoglobin is 14.3, hematocrit is 43.2, white  blood cell count 9.1, platelet count 248. Latest potassium ____________,  latest sodium is 142, glucose 124, BUN 15, creatinine 0.7. AST 23, ALT 41.  Urine culture performed on April 17, 2003 was greater than 100,000  colonies of Escherichia coli proteus mirabilis. At the time of discharge,  all vitals were stable. The patient had no significant paresis. She  discharged at a supervision modified independent level overall. Overall, she  made progress and met all goals. She discharged home on  hospital day #24  supervision.   DISCHARGE MEDICATIONS:  1. Tricor 54 mg daily.  2. Aggrenox one tablet twice daily.  3. Tenex 2 mg at night.  4. Lodine 4 mg twice daily.  5. Pain management with Tylenol.   ACTIVITY:  No driving or alcohol use. ___________. She requires 24-hour  supervision. No smoking.   FOLLOW UP:  [facility] for PT and OT. Home health RN will check UA  and urine culture prior to discharge. Follow up with Dr. Theda Sers on May 29, 2003. Follow up with Dr. Orson Ape within four to six weeks and check liver  function tests while on Tricor.      Pamella Pert, P.A.                         Jarvis Morgan, [name]    LB/MEDQ  D:  04/29/2003  T:  04/30/2003  Job:  [id number]   cc:   Leonides Grills, [name]  [address] [place] [address]  Fax: [phone]

## 2010-08-21 ENCOUNTER — Emergency Department (HOSPITAL_COMMUNITY)
Admission: EM | Admit: 2010-08-21 | Discharge: 2010-08-22 | Disposition: A | Discharge status: Home / Self Care | Payer: Medicare Other | Attending: Emergency Medicine | Admitting: Emergency Medicine

## 2010-08-21 DIAGNOSIS — N390 Urinary tract infection, site not specified: Secondary | ICD-10-CM | POA: Insufficient documentation

## 2010-08-21 DIAGNOSIS — R350 Frequency of micturition: Secondary | ICD-10-CM | POA: Insufficient documentation

## 2010-08-21 DIAGNOSIS — I10 Essential (primary) hypertension: Secondary | ICD-10-CM | POA: Insufficient documentation

## 2010-08-21 DIAGNOSIS — R300 Dysuria: Secondary | ICD-10-CM | POA: Insufficient documentation

## 2010-08-21 DIAGNOSIS — Z8673 Personal history of transient ischemic attack (TIA), and cerebral infarction without residual deficits: Secondary | ICD-10-CM | POA: Insufficient documentation

## 2010-08-22 LAB — URINE MICROSCOPIC-ADD ON

## 2010-08-22 LAB — URINALYSIS, ROUTINE W REFLEX MICROSCOPIC
Glucose, UA: NEGATIVE mg/dL
Hgb urine dipstick: NEGATIVE
Ketones, ur: NEGATIVE mg/dL
Nitrite: POSITIVE — AB
Protein, ur: NEGATIVE mg/dL
Specific Gravity, Urine: 1.017 (ref 1.005–1.030)
Urobilinogen, UA: 1 mg/dL (ref 0.0–1.0)
pH: 6 (ref 5.0–8.0)

## 2010-12-20 LAB — CBC
HCT: 33.1 — ABNORMAL LOW
HCT: 33.9 — ABNORMAL LOW
HCT: 35.2 — ABNORMAL LOW
HCT: 35.8 — ABNORMAL LOW
Hemoglobin: 11.4 — ABNORMAL LOW
Hemoglobin: 11.5 — ABNORMAL LOW
Hemoglobin: 12.1
Hemoglobin: 12.4
MCHC: 34
MCHC: 34.4
MCHC: 34.6
MCHC: 34.7
MCV: 91.2
MCV: 91.8
MCV: 92.3
MCV: 93.1
Platelets: 416 — ABNORMAL HIGH
Platelets: 421 — ABNORMAL HIGH
Platelets: 425 — ABNORMAL HIGH
Platelets: 426 — ABNORMAL HIGH
RBC: 3.59 — ABNORMAL LOW
RBC: 3.64 — ABNORMAL LOW
RBC: 3.86 — ABNORMAL LOW
RBC: 3.9
RDW: 13.5
RDW: 13.8
RDW: 13.8
RDW: 14
WBC: 10
WBC: 11.2 — ABNORMAL HIGH
WBC: 8
WBC: 8.1

## 2010-12-20 LAB — BASIC METABOLIC PANEL
BUN: 33 — ABNORMAL HIGH
BUN: 46 — ABNORMAL HIGH
BUN: 75 — ABNORMAL HIGH
BUN: 82 — ABNORMAL HIGH
CO2: 19
CO2: 19
CO2: 20
CO2: 20
Calcium: 8.4
Calcium: 8.8
Calcium: 9
Calcium: 9.1
Chloride: 107
Chloride: 110
Chloride: 117 — ABNORMAL HIGH
Chloride: 118 — ABNORMAL HIGH
Creatinine, Ser: 2.01 — ABNORMAL HIGH
Creatinine, Ser: 2.65 — ABNORMAL HIGH
Creatinine, Ser: 4.31 — ABNORMAL HIGH
Creatinine, Ser: 4.72 — ABNORMAL HIGH
GFR calc Af Amer: 11 — ABNORMAL LOW
GFR calc Af Amer: 12 — ABNORMAL LOW
GFR calc Af Amer: 21 — ABNORMAL LOW
GFR calc Af Amer: 29 — ABNORMAL LOW
GFR calc non Af Amer: 10 — ABNORMAL LOW
GFR calc non Af Amer: 18 — ABNORMAL LOW
GFR calc non Af Amer: 24 — ABNORMAL LOW
GFR calc non Af Amer: 9 — ABNORMAL LOW
Glucose, Bld: 121 — ABNORMAL HIGH
Glucose, Bld: 141 — ABNORMAL HIGH
Glucose, Bld: 84
Glucose, Bld: 88
Potassium: 2.9 — ABNORMAL LOW
Potassium: 3.1 — ABNORMAL LOW
Potassium: 3.4 — ABNORMAL LOW
Potassium: 4.2
Sodium: 138
Sodium: 139
Sodium: 142
Sodium: 144

## 2010-12-20 LAB — URINE CULTURE: Colony Count: 35000

## 2010-12-20 LAB — URINALYSIS, ROUTINE W REFLEX MICROSCOPIC
Bilirubin Urine: NEGATIVE
Glucose, UA: NEGATIVE
Ketones, ur: NEGATIVE
Nitrite: NEGATIVE
Specific Gravity, Urine: 1.01
Urobilinogen, UA: 0.2
pH: 6

## 2010-12-20 LAB — DIFFERENTIAL
Basophils Absolute: 0
Basophils Absolute: 0
Basophils Absolute: 0
Basophils Absolute: 0
Basophils Relative: 0
Basophils Relative: 0
Basophils Relative: 0
Basophils Relative: 0
Eosinophils Absolute: 0.1
Eosinophils Absolute: 0.2
Eosinophils Absolute: 0.3
Eosinophils Absolute: 0.5
Eosinophils Relative: 1
Eosinophils Relative: 2
Eosinophils Relative: 4
Eosinophils Relative: 6 — ABNORMAL HIGH
Lymphocytes Relative: 19
Lymphocytes Relative: 21
Lymphocytes Relative: 28
Lymphocytes Relative: 33
Lymphs Abs: 2.1
Lymphs Abs: 2.1
Lymphs Abs: 2.2
Lymphs Abs: 2.6
Monocytes Absolute: 0.6
Monocytes Absolute: 0.8
Monocytes Absolute: 0.8
Monocytes Absolute: 0.9
Monocytes Relative: 10
Monocytes Relative: 8
Monocytes Relative: 8
Monocytes Relative: 8
Neutro Abs: 4.4
Neutro Abs: 4.5
Neutro Abs: 6.9
Neutro Abs: 8 — ABNORMAL HIGH
Neutrophils Relative %: 55
Neutrophils Relative %: 56
Neutrophils Relative %: 69
Neutrophils Relative %: 71

## 2010-12-20 LAB — PTH, INTACT AND CALCIUM
Calcium, Total (PTH): 8.8
PTH: 55.1

## 2010-12-20 LAB — PHOSPHORUS: Phosphorus: 4.4

## 2010-12-20 LAB — APTT
aPTT: 34
aPTT: 37

## 2010-12-20 LAB — PROTIME-INR
INR: 1.1
INR: 1.2
Prothrombin Time: 14.6
Prothrombin Time: 15.2

## 2010-12-20 LAB — URINE MICROSCOPIC-ADD ON

## 2010-12-20 LAB — B-NATRIURETIC PEPTIDE (CONVERTED LAB)
Pro B Natriuretic peptide (BNP): <30
Pro B Natriuretic peptide (BNP): <30

## 2010-12-22 LAB — BASIC METABOLIC PANEL
BUN: 22
BUN: 23
BUN: 28 — ABNORMAL HIGH
BUN: 32 — ABNORMAL HIGH
BUN: 44 — ABNORMAL HIGH
BUN: 53 — ABNORMAL HIGH
BUN: 68 — ABNORMAL HIGH
CO2: 12 — ABNORMAL LOW
CO2: 14 — ABNORMAL LOW
CO2: 16 — ABNORMAL LOW
CO2: 17 — ABNORMAL LOW
CO2: 18 — ABNORMAL LOW
CO2: 19
CO2: 8 — CL
Calcium: 8.4
Calcium: 8.4
Calcium: 8.5
Calcium: 8.6
Calcium: 8.9
Calcium: 9
Calcium: 9.7
Chloride: 109
Chloride: 109
Chloride: 110
Chloride: 114 — ABNORMAL HIGH
Chloride: 116 — ABNORMAL HIGH
Chloride: 117 — ABNORMAL HIGH
Chloride: 121 — ABNORMAL HIGH
Creatinine, Ser: 1.92 — ABNORMAL HIGH
Creatinine, Ser: 1.92 — ABNORMAL HIGH
Creatinine, Ser: 2.29 — ABNORMAL HIGH
Creatinine, Ser: 2.51 — ABNORMAL HIGH
Creatinine, Ser: 3.3 — ABNORMAL HIGH
Creatinine, Ser: 3.77 — ABNORMAL HIGH
Creatinine, Ser: 4.73 — ABNORMAL HIGH
GFR calc Af Amer: 11 — ABNORMAL LOW
GFR calc Af Amer: 14 — ABNORMAL LOW
GFR calc Af Amer: 16 — ABNORMAL LOW
GFR calc Af Amer: 23 — ABNORMAL LOW
GFR calc Af Amer: 25 — ABNORMAL LOW
GFR calc Af Amer: 31 — ABNORMAL LOW
GFR calc Af Amer: 31 — ABNORMAL LOW
GFR calc non Af Amer: 12 — ABNORMAL LOW
GFR calc non Af Amer: 14 — ABNORMAL LOW
GFR calc non Af Amer: 19 — ABNORMAL LOW
GFR calc non Af Amer: 21 — ABNORMAL LOW
GFR calc non Af Amer: 25 — ABNORMAL LOW
GFR calc non Af Amer: 25 — ABNORMAL LOW
GFR calc non Af Amer: 9 — ABNORMAL LOW
Glucose, Bld: 108 — ABNORMAL HIGH
Glucose, Bld: 120 — ABNORMAL HIGH
Glucose, Bld: 123 — ABNORMAL HIGH
Glucose, Bld: 124 — ABNORMAL HIGH
Glucose, Bld: 124 — ABNORMAL HIGH
Glucose, Bld: 153 — ABNORMAL HIGH
Glucose, Bld: 81
Potassium: 3.2 — ABNORMAL LOW
Potassium: 3.2 — ABNORMAL LOW
Potassium: 3.4 — ABNORMAL LOW
Potassium: 3.4 — ABNORMAL LOW
Potassium: 3.5
Potassium: 3.5
Potassium: 3.6
Sodium: 136
Sodium: 136
Sodium: 137
Sodium: 138
Sodium: 139
Sodium: 139
Sodium: 139

## 2010-12-22 LAB — BLOOD GAS, ARTERIAL
Acid-base deficit: 17 — ABNORMAL HIGH
Acid-base deficit: 20 — ABNORMAL HIGH
Bicarbonate: 6.7 — ABNORMAL LOW
Bicarbonate: 8.4 — ABNORMAL LOW
FIO2: 21
O2 Content: 21
O2 Saturation: 97.4
O2 Saturation: 98.5
Patient temperature: 37
Patient temperature: 37
TCO2: 6.6
TCO2: 8
pCO2 arterial: 17.2 — CL
pCO2 arterial: 17.5 — CL
pH, Arterial: 7.205 — ABNORMAL LOW
pH, Arterial: 7.307 — ABNORMAL LOW
pO2, Arterial: 105 — ABNORMAL HIGH
pO2, Arterial: 125 — ABNORMAL HIGH

## 2010-12-22 LAB — CBC
HCT: 22.5 — ABNORMAL LOW
HCT: 24.5 — ABNORMAL LOW
HCT: 25.3 — ABNORMAL LOW
HCT: 30.8 — ABNORMAL LOW
HCT: 33.5 — ABNORMAL LOW
HCT: 34.2 — ABNORMAL LOW
HCT: 35.1 — ABNORMAL LOW
Hemoglobin: 11.2 — ABNORMAL LOW
Hemoglobin: 11.6 — ABNORMAL LOW
Hemoglobin: 11.8 — ABNORMAL LOW
Hemoglobin: 7.5 — CL
Hemoglobin: 8.1 — ABNORMAL LOW
Hemoglobin: 8.2 — ABNORMAL LOW
Hemoglobin: 9.9 — ABNORMAL LOW
MCHC: 32.2
MCHC: 32.5
MCHC: 32.9
MCHC: 33.1
MCHC: 33.3
MCHC: 33.6
MCHC: 33.8
MCV: 92.2
MCV: 93.2
MCV: 93.5
MCV: 95.1
MCV: 95.3
MCV: 96.6
MCV: 98.3
Platelets: 208
Platelets: 221
Platelets: 230
Platelets: 240
Platelets: 243
Platelets: 262
Platelets: 274
RBC: 2.37 — ABNORMAL LOW
RBC: 2.58 — ABNORMAL LOW
RBC: 2.62 — ABNORMAL LOW
RBC: 3.13 — ABNORMAL LOW
RBC: 3.59 — ABNORMAL LOW
RBC: 3.71 — ABNORMAL LOW
RBC: 3.77 — ABNORMAL LOW
RDW: 16.9 — ABNORMAL HIGH
RDW: 17.1 — ABNORMAL HIGH
RDW: 17.2 — ABNORMAL HIGH
RDW: 17.4 — ABNORMAL HIGH
RDW: 17.5 — ABNORMAL HIGH
RDW: 17.6 — ABNORMAL HIGH
RDW: 17.6 — ABNORMAL HIGH
WBC: 11 — ABNORMAL HIGH
WBC: 11.1 — ABNORMAL HIGH
WBC: 11.4 — ABNORMAL HIGH
WBC: 11.8 — ABNORMAL HIGH
WBC: 8.5
WBC: 9.7
WBC: 9.8

## 2010-12-22 LAB — URINE CULTURE
Colony Count: >=100000
Colony Count: NO GROWTH
Culture: NO GROWTH
Special Requests: NEGATIVE

## 2010-12-22 LAB — DIFFERENTIAL
Basophils Absolute: 0
Basophils Absolute: 0
Basophils Absolute: 0
Basophils Absolute: 0
Basophils Absolute: 0
Basophils Absolute: 0
Basophils Absolute: 0
Basophils Relative: 0
Basophils Relative: 0
Basophils Relative: 0
Basophils Relative: 0
Basophils Relative: 0
Basophils Relative: 0
Basophils Relative: 0
Eosinophils Absolute: 0.1
Eosinophils Absolute: 0.3
Eosinophils Absolute: 0.4
Eosinophils Absolute: 0.4
Eosinophils Absolute: 0.4
Eosinophils Absolute: 0.5
Eosinophils Absolute: 0.5
Eosinophils Relative: 1
Eosinophils Relative: 3
Eosinophils Relative: 3
Eosinophils Relative: 4
Eosinophils Relative: 4
Eosinophils Relative: 5
Eosinophils Relative: 5
Lymphocytes Relative: 17
Lymphocytes Relative: 18
Lymphocytes Relative: 21
Lymphocytes Relative: 23
Lymphocytes Relative: 23
Lymphocytes Relative: 25
Lymphocytes Relative: 27
Lymphs Abs: 1.7
Lymphs Abs: 1.8
Lymphs Abs: 2.3
Lymphs Abs: 2.4
Lymphs Abs: 2.4
Lymphs Abs: 2.5
Lymphs Abs: 2.7
Monocytes Absolute: 0.8
Monocytes Absolute: 0.8
Monocytes Absolute: 0.9
Monocytes Absolute: 0.9
Monocytes Absolute: 1.2 — ABNORMAL HIGH
Monocytes Absolute: 1.3 — ABNORMAL HIGH
Monocytes Absolute: 1.5 — ABNORMAL HIGH
Monocytes Relative: 10
Monocytes Relative: 11
Monocytes Relative: 11
Monocytes Relative: 13 — ABNORMAL HIGH
Monocytes Relative: 7
Monocytes Relative: 8
Monocytes Relative: 9
Neutro Abs: 4.9
Neutro Abs: 5.9
Neutro Abs: 6.9
Neutro Abs: 7
Neutro Abs: 7
Neutro Abs: 7.3
Neutro Abs: 8.4 — ABNORMAL HIGH
Neutrophils Relative %: 58
Neutrophils Relative %: 61
Neutrophils Relative %: 62
Neutrophils Relative %: 62
Neutrophils Relative %: 63
Neutrophils Relative %: 70
Neutrophils Relative %: 75

## 2010-12-22 LAB — COMPREHENSIVE METABOLIC PANEL
ALT: 127 — ABNORMAL HIGH
AST: 94 — ABNORMAL HIGH
Albumin: 2.8 — ABNORMAL LOW
Alkaline Phosphatase: 32 — ABNORMAL LOW
BUN: 58 — ABNORMAL HIGH
CO2: 15 — ABNORMAL LOW
Calcium: 9.1
Chloride: 119 — ABNORMAL HIGH
Creatinine, Ser: 3.95 — ABNORMAL HIGH
GFR calc Af Amer: 13 — ABNORMAL LOW
GFR calc non Af Amer: 11 — ABNORMAL LOW
Glucose, Bld: 90
Potassium: 2.7 — CL
Sodium: 146 — ABNORMAL HIGH
Total Bilirubin: 1.8 — ABNORMAL HIGH
Total Protein: 6.9

## 2010-12-22 LAB — HEPATIC FUNCTION PANEL
ALT: 100 — ABNORMAL HIGH
ALT: 125 — ABNORMAL HIGH
ALT: 86 — ABNORMAL HIGH
ALT: 98 — ABNORMAL HIGH
AST: 108 — ABNORMAL HIGH
AST: 76 — ABNORMAL HIGH
AST: 80 — ABNORMAL HIGH
AST: 81 — ABNORMAL HIGH
Albumin: 2.5 — ABNORMAL LOW
Albumin: 2.6 — ABNORMAL LOW
Albumin: 2.9 — ABNORMAL LOW
Albumin: 2.9 — ABNORMAL LOW
Alkaline Phosphatase: 28 — ABNORMAL LOW
Alkaline Phosphatase: 31 — ABNORMAL LOW
Alkaline Phosphatase: 32 — ABNORMAL LOW
Alkaline Phosphatase: 35 — ABNORMAL LOW
Bilirubin, Direct: 0.4 — ABNORMAL HIGH
Bilirubin, Direct: 0.4 — ABNORMAL HIGH
Bilirubin, Direct: 0.5 — ABNORMAL HIGH
Bilirubin, Direct: 0.6 — ABNORMAL HIGH
Indirect Bilirubin: 0.3
Indirect Bilirubin: 0.6
Indirect Bilirubin: 0.6
Indirect Bilirubin: 0.8
Total Bilirubin: 0.7
Total Bilirubin: 1
Total Bilirubin: 1.1
Total Bilirubin: 1.4 — ABNORMAL HIGH
Total Protein: 6.2
Total Protein: 6.8
Total Protein: 7
Total Protein: 7.2

## 2010-12-22 LAB — URINE MICROSCOPIC-ADD ON

## 2010-12-22 LAB — CARDIAC PANEL(CRET KIN+CKTOT+MB+TROPI)
CK, MB: 1.8
CK, MB: 1.8
CK, MB: 1.9
Relative Index: INVALID
Relative Index: INVALID
Relative Index: INVALID
Total CK: 49
Total CK: 49
Total CK: 51
Troponin I: 0.02
Troponin I: 0.03
Troponin I: 0.03

## 2010-12-22 LAB — TSH
TSH: 0.813
TSH: 1.346 (ref 0.350–4.500)

## 2010-12-22 LAB — PROTIME-INR
INR: 1.3
Prothrombin Time: 17.2 — ABNORMAL HIGH

## 2010-12-22 LAB — PHOSPHORUS
Phosphorus: 2.7
Phosphorus: 3.8

## 2010-12-22 LAB — URINALYSIS, ROUTINE W REFLEX MICROSCOPIC
Bilirubin Urine: NEGATIVE
Bilirubin Urine: NEGATIVE
Glucose, UA: NEGATIVE
Glucose, UA: NEGATIVE
Ketones, ur: NEGATIVE
Ketones, ur: NEGATIVE
Nitrite: NEGATIVE
Nitrite: NEGATIVE
Protein, ur: 100 — AB
Protein, ur: 30 — AB
Specific Gravity, Urine: 1.015
Specific Gravity, Urine: 1.025
Urobilinogen, UA: 1
Urobilinogen, UA: 1
pH: 5.5
pH: 6.5

## 2010-12-22 LAB — ABO/RH: ABO/RH(D): B POS

## 2010-12-22 LAB — HOMOCYSTEINE: Homocysteine: 12.4

## 2010-12-22 LAB — CROSSMATCH
ABO/RH(D): B POS
Antibody Screen: NEGATIVE

## 2010-12-22 LAB — FERRITIN: Ferritin: 714 — ABNORMAL HIGH (ref 10–291)

## 2010-12-22 LAB — RPR: RPR Ser Ql: NONREACTIVE

## 2010-12-22 LAB — IRON AND TIBC
Iron: 33 — ABNORMAL LOW
Saturation Ratios: 13 — ABNORMAL LOW
TIBC: 256
UIBC: 223

## 2010-12-22 LAB — RETICULOCYTES
RBC.: 2.62 — ABNORMAL LOW
Retic Count, Absolute: 70.7
Retic Ct Pct: 2.7

## 2010-12-22 LAB — VITAMIN B12: Vitamin B-12: 942 — ABNORMAL HIGH (ref 211–911)

## 2010-12-22 LAB — ANTI-SMOOTH MUSCLE ANTIBODY, IGG: F-Actin IgG: <20 U (ref <20)

## 2010-12-22 LAB — ANA: Anti Nuclear Antibody(ANA): NEGATIVE

## 2010-12-22 LAB — IGG, IGA, IGM
IgA: 392 — ABNORMAL HIGH
IgG (Immunoglobin G), Serum: 1620 — ABNORMAL HIGH
IgM, Serum: 52 — ABNORMAL LOW

## 2010-12-22 LAB — FOLATE: Folate: >20

## 2010-12-22 LAB — HEMOGLOBIN A1C
Hgb A1c MFr Bld: 5.3
Mean Plasma Glucose: 111

## 2010-12-22 LAB — OCCULT BLOOD X 1 CARD TO LAB, STOOL
Fecal Occult Bld: NEGATIVE
Fecal Occult Bld: NEGATIVE

## 2010-12-22 LAB — CULTURE, BLOOD (ROUTINE X 2)
Culture: NO GROWTH
Report Status: 7132009

## 2010-12-22 LAB — ERYTHROPOIETIN: Erythropoietin: 22.4

## 2010-12-22 LAB — HEMOGLOBIN AND HEMATOCRIT, BLOOD
HCT: 35.5 — ABNORMAL LOW
Hemoglobin: 11.8 — ABNORMAL LOW

## 2010-12-22 LAB — APTT: aPTT: 30

## 2010-12-22 LAB — SEDIMENTATION RATE: Sed Rate: 78 — ABNORMAL HIGH

## 2010-12-22 LAB — MITOCHONDRIAL ANTIBODIES: Mitochondrial M2 Ab, IgG: <20.1 Units (ref <20.1)

## 2010-12-22 LAB — MAGNESIUM: Magnesium: 1.9

## 2010-12-22 LAB — HEPATITIS C ANTIBODY: HCV Ab: NEGATIVE

## 2010-12-22 LAB — HEPATITIS B SURFACE ANTIGEN: Hepatitis B Surface Ag: NEGATIVE

## 2010-12-23 LAB — HEPATIC FUNCTION PANEL
ALT: 111 — ABNORMAL HIGH
ALT: 126 — ABNORMAL HIGH
AST: 118 — ABNORMAL HIGH
AST: 129 — ABNORMAL HIGH
Albumin: 2.4 — ABNORMAL LOW
Albumin: 2.5 — ABNORMAL LOW
Alkaline Phosphatase: 33 — ABNORMAL LOW
Alkaline Phosphatase: 47
Bilirubin, Direct: 0.3
Bilirubin, Direct: 0.3
Indirect Bilirubin: 0.4
Indirect Bilirubin: 0.5
Total Bilirubin: 0.7
Total Bilirubin: 0.8
Total Protein: 6.3
Total Protein: 6.3

## 2010-12-23 LAB — DIFFERENTIAL
Basophils Absolute: 0
Basophils Relative: 0
Eosinophils Absolute: 0.4
Eosinophils Relative: 4
Lymphocytes Relative: 24
Lymphs Abs: 2.4
Monocytes Absolute: 1.2 — ABNORMAL HIGH
Monocytes Relative: 12
Neutro Abs: 6.1
Neutrophils Relative %: 60

## 2010-12-23 LAB — BASIC METABOLIC PANEL
BUN: 25 — ABNORMAL HIGH
BUN: 26 — ABNORMAL HIGH
BUN: 27 — ABNORMAL HIGH
CO2: 20
CO2: 21
CO2: 23
Calcium: 8.6
Calcium: 8.8
Calcium: 9.2
Chloride: 110
Chloride: 111
Chloride: 111
Creatinine, Ser: 1.82 — ABNORMAL HIGH
Creatinine, Ser: 1.83 — ABNORMAL HIGH
Creatinine, Ser: 1.9 — ABNORMAL HIGH
GFR calc Af Amer: 31 — ABNORMAL LOW
GFR calc Af Amer: 33 — ABNORMAL LOW
GFR calc Af Amer: 33 — ABNORMAL LOW
GFR calc non Af Amer: 26 — ABNORMAL LOW
GFR calc non Af Amer: 27 — ABNORMAL LOW
GFR calc non Af Amer: 27 — ABNORMAL LOW
Glucose, Bld: 100 — ABNORMAL HIGH
Glucose, Bld: 104 — ABNORMAL HIGH
Glucose, Bld: 96
Potassium: 3.6
Potassium: 3.6
Potassium: 4.1
Sodium: 138
Sodium: 139
Sodium: 140

## 2010-12-23 LAB — CBC
HCT: 32.2 — ABNORMAL LOW
Hemoglobin: 10.6 — ABNORMAL LOW
MCHC: 33
MCV: 93.7
Platelets: 275
RBC: 3.44 — ABNORMAL LOW
RDW: 16.8 — ABNORMAL HIGH
WBC: 10.2

## 2010-12-23 LAB — VITAMIN B12: Vitamin B-12: 561 (ref 211–911)

## 2011-01-09 LAB — DIFFERENTIAL
Basophils Absolute: 0
Basophils Absolute: 0
Basophils Absolute: 0
Basophils Absolute: 0.1
Basophils Absolute: 0.1
Basophils Absolute: 0
Basophils Absolute: 0
Basophils Absolute: 0
Basophils Absolute: 0
Basophils Absolute: 0
Basophils Relative: 0
Basophils Relative: 0
Basophils Relative: 0
Basophils Relative: 1
Basophils Relative: 2 — ABNORMAL HIGH
Basophils Relative: 0
Basophils Relative: 0
Basophils Relative: 0
Basophils Relative: 0
Basophils Relative: 1
Eosinophils Absolute: 0.2
Eosinophils Absolute: 0.2
Eosinophils Absolute: 0.2
Eosinophils Absolute: 0.2
Eosinophils Absolute: 0.2
Eosinophils Absolute: 0
Eosinophils Absolute: 0.1
Eosinophils Absolute: 0.1
Eosinophils Absolute: 0.2
Eosinophils Absolute: 0.2
Eosinophils Relative: 2
Eosinophils Relative: 3
Eosinophils Relative: 3
Eosinophils Relative: 3
Eosinophils Relative: 3
Eosinophils Relative: 0
Eosinophils Relative: 1
Eosinophils Relative: 1
Eosinophils Relative: 2
Eosinophils Relative: 3
Lymphocytes Relative: 29
Lymphocytes Relative: 29
Lymphocytes Relative: 29
Lymphocytes Relative: 31
Lymphocytes Relative: 37
Lymphocytes Relative: 22
Lymphocytes Relative: 22
Lymphocytes Relative: 25
Lymphocytes Relative: 26
Lymphocytes Relative: 27
Lymphs Abs: 2
Lymphs Abs: 2.2
Lymphs Abs: 2.2
Lymphs Abs: 2.6
Lymphs Abs: 2.8
Lymphs Abs: 1.7
Lymphs Abs: 1.8
Lymphs Abs: 1.9
Lymphs Abs: 2.2
Lymphs Abs: 2.2
Monocytes Absolute: 0.7
Monocytes Absolute: 0.8 — ABNORMAL HIGH
Monocytes Absolute: 0.8 — ABNORMAL HIGH
Monocytes Absolute: 0.8 — ABNORMAL HIGH
Monocytes Absolute: 0.9 — ABNORMAL HIGH
Monocytes Absolute: 0.6
Monocytes Absolute: 0.7
Monocytes Absolute: 0.7
Monocytes Absolute: 0.7
Monocytes Absolute: 0.9 — ABNORMAL HIGH
Monocytes Relative: 10
Monocytes Relative: 11
Monocytes Relative: 11
Monocytes Relative: 12 — ABNORMAL HIGH
Monocytes Relative: 9
Monocytes Relative: 10
Monocytes Relative: 10
Monocytes Relative: 7
Monocytes Relative: 8
Monocytes Relative: 9
Neutro Abs: 3.7
Neutro Abs: 3.9
Neutro Abs: 4.2
Neutro Abs: 4.3
Neutro Abs: 4.6
Neutro Abs: 4.1
Neutro Abs: 5
Neutro Abs: 5.3
Neutro Abs: 5.8
Neutro Abs: 6.1
Neutrophils Relative %: 48
Neutrophils Relative %: 55
Neutrophils Relative %: 56
Neutrophils Relative %: 57
Neutrophils Relative %: 57
Neutrophils Relative %: 61
Neutrophils Relative %: 62
Neutrophils Relative %: 63
Neutrophils Relative %: 69
Neutrophils Relative %: 70

## 2011-01-09 LAB — BASIC METABOLIC PANEL
BUN: 16
BUN: 19
BUN: 22
BUN: 33 — ABNORMAL HIGH
BUN: 45 — ABNORMAL HIGH
BUN: 64 — ABNORMAL HIGH
BUN: 81 — ABNORMAL HIGH
BUN: 82 — ABNORMAL HIGH
BUN: 83 — ABNORMAL HIGH
BUN: 84 — ABNORMAL HIGH
BUN: 84 — ABNORMAL HIGH
CO2: 20
CO2: 20
CO2: 21
CO2: 21
CO2: 22
CO2: 26
CO2: 18 — ABNORMAL LOW
CO2: 18 — ABNORMAL LOW
CO2: 21
CO2: 22
CO2: 23
Calcium: 7.4 — ABNORMAL LOW
Calcium: 7.6 — ABNORMAL LOW
Calcium: 7.8 — ABNORMAL LOW
Calcium: 7.9 — ABNORMAL LOW
Calcium: 7.9 — ABNORMAL LOW
Calcium: 8 — ABNORMAL LOW
Calcium: 7.5 — ABNORMAL LOW
Calcium: 7.7 — ABNORMAL LOW
Calcium: 8 — ABNORMAL LOW
Calcium: 8 — ABNORMAL LOW
Calcium: 8.5
Chloride: 109
Chloride: 111
Chloride: 114 — ABNORMAL HIGH
Chloride: 116 — ABNORMAL HIGH
Chloride: 116 — ABNORMAL HIGH
Chloride: 120 — ABNORMAL HIGH
Chloride: 101
Chloride: 103
Chloride: 105
Chloride: 106
Chloride: 99
Creatinine, Ser: 1.6 — ABNORMAL HIGH
Creatinine, Ser: 1.8 — ABNORMAL HIGH
Creatinine, Ser: 2.09 — ABNORMAL HIGH
Creatinine, Ser: 2.66 — ABNORMAL HIGH
Creatinine, Ser: 3.28 — ABNORMAL HIGH
Creatinine, Ser: 5.36 — ABNORMAL HIGH
Creatinine, Ser: 11.58 — ABNORMAL HIGH
Creatinine, Ser: 13.84 — ABNORMAL HIGH
Creatinine, Ser: 14.31 — ABNORMAL HIGH
Creatinine, Ser: 15.25 — ABNORMAL HIGH
Creatinine, Ser: 9.56 — ABNORMAL HIGH
GFR calc Af Amer: 17 — ABNORMAL LOW
GFR calc Af Amer: 21 — ABNORMAL LOW
GFR calc Af Amer: 28 — ABNORMAL LOW
GFR calc Af Amer: 33 — ABNORMAL LOW
GFR calc Af Amer: 38 — ABNORMAL LOW
GFR calc Af Amer: 9 — ABNORMAL LOW
GFR calc Af Amer: 3 — ABNORMAL LOW
GFR calc Af Amer: 3 — ABNORMAL LOW
GFR calc Af Amer: 3 — ABNORMAL LOW
GFR calc Af Amer: 4 — ABNORMAL LOW
GFR calc Af Amer: 5 — ABNORMAL LOW
GFR calc non Af Amer: 14 — ABNORMAL LOW
GFR calc non Af Amer: 18 — ABNORMAL LOW
GFR calc non Af Amer: 23 — ABNORMAL LOW
GFR calc non Af Amer: 28 — ABNORMAL LOW
GFR calc non Af Amer: 32 — ABNORMAL LOW
GFR calc non Af Amer: 8 — ABNORMAL LOW
GFR calc non Af Amer: 2 — ABNORMAL LOW
GFR calc non Af Amer: 3 — ABNORMAL LOW
GFR calc non Af Amer: 3 — ABNORMAL LOW
GFR calc non Af Amer: 3 — ABNORMAL LOW
GFR calc non Af Amer: 4 — ABNORMAL LOW
Glucose, Bld: 108 — ABNORMAL HIGH
Glucose, Bld: 76
Glucose, Bld: 79
Glucose, Bld: 80
Glucose, Bld: 84
Glucose, Bld: 91
Glucose, Bld: 101 — ABNORMAL HIGH
Glucose, Bld: 110 — ABNORMAL HIGH
Glucose, Bld: 75
Glucose, Bld: 88
Glucose, Bld: 97
Potassium: 3.2 — ABNORMAL LOW
Potassium: 3.4 — ABNORMAL LOW
Potassium: 3.4 — ABNORMAL LOW
Potassium: 3.5
Potassium: 3.6
Potassium: 3.8
Potassium: 3.6
Potassium: 3.9
Potassium: 4.7
Potassium: 4.7
Potassium: 5.5 — ABNORMAL HIGH
Sodium: 140
Sodium: 140
Sodium: 142
Sodium: 144
Sodium: 145
Sodium: 147 — ABNORMAL HIGH
Sodium: 133 — ABNORMAL LOW
Sodium: 134 — ABNORMAL LOW
Sodium: 136
Sodium: 136
Sodium: 138

## 2011-01-09 LAB — RENAL FUNCTION PANEL
Albumin: 2.1 — ABNORMAL LOW
Albumin: 2.2 — ABNORMAL LOW
Albumin: 2.5 — ABNORMAL LOW
BUN: 48 — ABNORMAL HIGH
BUN: 77 — ABNORMAL HIGH
BUN: 83 — ABNORMAL HIGH
CO2: 22
CO2: 21
CO2: 26
Calcium: 7.8 — ABNORMAL LOW
Calcium: 7.4 — ABNORMAL LOW
Calcium: 7.8 — ABNORMAL LOW
Chloride: 112
Chloride: 106
Chloride: 98
Creatinine, Ser: 3.7 — ABNORMAL HIGH
Creatinine, Ser: 7.61 — ABNORMAL HIGH
Creatinine, Ser: 9.68 — ABNORMAL HIGH
GFR calc Af Amer: 14 — ABNORMAL LOW
GFR calc Af Amer: 5 — ABNORMAL LOW
GFR calc Af Amer: 6 — ABNORMAL LOW
GFR calc non Af Amer: 12 — ABNORMAL LOW
GFR calc non Af Amer: 4 — ABNORMAL LOW
GFR calc non Af Amer: 5 — ABNORMAL LOW
Glucose, Bld: 85
Glucose, Bld: 87
Glucose, Bld: 99
Phosphorus: 3.3
Phosphorus: 5.7 — ABNORMAL HIGH
Phosphorus: 7.6 — ABNORMAL HIGH
Potassium: 3.2 — ABNORMAL LOW
Potassium: 3.2 — ABNORMAL LOW
Potassium: 3.5
Sodium: 143
Sodium: 135
Sodium: 140

## 2011-01-09 LAB — CBC
HCT: 24.6 — ABNORMAL LOW
HCT: 25.4 — ABNORMAL LOW
HCT: 26 — ABNORMAL LOW
HCT: 26.2 — ABNORMAL LOW
HCT: 28 — ABNORMAL LOW
HCT: 29.2 — ABNORMAL LOW
HCT: 29.5 — ABNORMAL LOW
HCT: 29.7 — ABNORMAL LOW
HCT: 30.1 — ABNORMAL LOW
HCT: 31 — ABNORMAL LOW
HCT: 31.5 — ABNORMAL LOW
Hemoglobin: 8.1 — ABNORMAL LOW
Hemoglobin: 8.2 — ABNORMAL LOW
Hemoglobin: 8.5 — ABNORMAL LOW
Hemoglobin: 8.6 — ABNORMAL LOW
Hemoglobin: 9.1 — ABNORMAL LOW
Hemoglobin: 10 — ABNORMAL LOW
Hemoglobin: 10.2 — ABNORMAL LOW
Hemoglobin: 10.5 — ABNORMAL LOW
Hemoglobin: 9.7 — ABNORMAL LOW
Hemoglobin: 9.7 — ABNORMAL LOW
Hemoglobin: 9.9 — ABNORMAL LOW
MCHC: 32.1
MCHC: 32.6
MCHC: 32.6
MCHC: 32.8
MCHC: 32.9
MCHC: 32.8
MCHC: 33
MCHC: 33.1
MCHC: 33.2
MCHC: 33.4
MCHC: 33.5
MCV: 87.4
MCV: 87.8
MCV: 87.8
MCV: 87.9
MCV: 88.5
MCV: 86.8
MCV: 86.9
MCV: 86.9
MCV: 87.2
MCV: 87.3
MCV: 87.3
Platelets: 319
Platelets: 414 — ABNORMAL HIGH
Platelets: 481 — ABNORMAL HIGH
Platelets: 501 — ABNORMAL HIGH
Platelets: 534 — ABNORMAL HIGH
Platelets: 374
Platelets: 386
Platelets: 411 — ABNORMAL HIGH
Platelets: 431 — ABNORMAL HIGH
Platelets: 541 — ABNORMAL HIGH
Platelets: 576 — ABNORMAL HIGH
RBC: 2.8 — ABNORMAL LOW
RBC: 2.87 — ABNORMAL LOW
RBC: 2.96 — ABNORMAL LOW
RBC: 2.98 — ABNORMAL LOW
RBC: 3.21 — ABNORMAL LOW
RBC: 3.36 — ABNORMAL LOW
RBC: 3.39 — ABNORMAL LOW
RBC: 3.4 — ABNORMAL LOW
RBC: 3.47 — ABNORMAL LOW
RBC: 3.55 — ABNORMAL LOW
RBC: 3.63 — ABNORMAL LOW
RDW: 14.4 — ABNORMAL HIGH
RDW: 14.6 — ABNORMAL HIGH
RDW: 14.7 — ABNORMAL HIGH
RDW: 14.8 — ABNORMAL HIGH
RDW: 14.9 — ABNORMAL HIGH
RDW: 13.6
RDW: 13.6
RDW: 13.8
RDW: 14
RDW: 14.1 — ABNORMAL HIGH
RDW: 14.1 — ABNORMAL HIGH
WBC: 6.9
WBC: 7.6
WBC: 7.6
WBC: 7.6
WBC: 8.2
WBC: 6.5
WBC: 6.7
WBC: 8.2
WBC: 8.3
WBC: 8.4
WBC: 8.8

## 2011-01-09 LAB — UIFE/LIGHT CHAINS/TP QN, 24-HR UR
Albumin, U: DETECTED
Alpha 1, Urine: DETECTED — AB
Alpha 2, Urine: DETECTED — AB
Beta, Urine: DETECTED — AB
Free Kappa Lt Chains,Ur: 8.81 — ABNORMAL HIGH (ref 0.04–1.51)
Free Kappa/Lambda Ratio: 4.45 — ABNORMAL HIGH (ref 0.46–4.00)
Free Lambda Excretion/Day: 42.57
Free Lambda Lt Chains,Ur: 1.98 — ABNORMAL HIGH (ref 0.08–1.01)
Free Lt Chn Excr Rate: 189.42
Gamma Globulin, Urine: DETECTED — AB
Time: 24
Total Protein, Urine-Ur/day: 273 — ABNORMAL HIGH (ref 10–140)
Total Protein, Urine: 12.7
Volume, Urine: 2150

## 2011-01-09 LAB — CARDIAC PANEL(CRET KIN+CKTOT+MB+TROPI)
CK, MB: 1.7
CK, MB: 1.8
CK, MB: 1.9
Relative Index: INVALID
Relative Index: INVALID
Relative Index: INVALID
Total CK: 47
Total CK: 62
Total CK: 62
Troponin I: 0.03
Troponin I: 0.04
Troponin I: 0.04

## 2011-01-09 LAB — URINE MICROSCOPIC-ADD ON

## 2011-01-09 LAB — COMPREHENSIVE METABOLIC PANEL
ALT: 11
ALT: 12
AST: 22
AST: 26
Albumin: 2.3 — ABNORMAL LOW
Albumin: 2.5 — ABNORMAL LOW
Alkaline Phosphatase: 38 — ABNORMAL LOW
Alkaline Phosphatase: 39
BUN: 89 — ABNORMAL HIGH
BUN: 89 — ABNORMAL HIGH
CO2: 18 — ABNORMAL LOW
CO2: 19
Calcium: 7.4 — ABNORMAL LOW
Calcium: 8.1 — ABNORMAL LOW
Chloride: 100
Chloride: 98
Creatinine, Ser: 16.34 — ABNORMAL HIGH
Creatinine, Ser: 16.59 — ABNORMAL HIGH
GFR calc Af Amer: 3 — ABNORMAL LOW
GFR calc Af Amer: 3 — ABNORMAL LOW
GFR calc non Af Amer: 2 — ABNORMAL LOW
GFR calc non Af Amer: 2 — ABNORMAL LOW
Glucose, Bld: 83
Glucose, Bld: 97
Potassium: 4.7
Potassium: 4.9
Sodium: 130 — ABNORMAL LOW
Sodium: 131 — ABNORMAL LOW
Total Bilirubin: 0.7
Total Bilirubin: 0.9
Total Protein: 5.6 — ABNORMAL LOW
Total Protein: 6.3

## 2011-01-09 LAB — URINALYSIS, ROUTINE W REFLEX MICROSCOPIC
Bilirubin Urine: NEGATIVE
Bilirubin Urine: NEGATIVE
Glucose, UA: NEGATIVE
Glucose, UA: NEGATIVE
Ketones, ur: NEGATIVE
Ketones, ur: NEGATIVE
Leukocytes, UA: NEGATIVE
Nitrite: NEGATIVE
Nitrite: NEGATIVE
Protein, ur: NEGATIVE
Protein, ur: NEGATIVE
Specific Gravity, Urine: 1.01
Specific Gravity, Urine: <1.005 — ABNORMAL LOW
Urobilinogen, UA: 0.2
Urobilinogen, UA: 0.2
pH: 5.5
pH: 6

## 2011-01-09 LAB — AMYLASE: Amylase: 93

## 2011-01-09 LAB — PROTEIN ELECTROPH W RFLX QUANT IMMUNOGLOBULINS
Albumin ELP: 42.2 — ABNORMAL LOW
Alpha-1-Globulin: 9.8 — ABNORMAL HIGH
Alpha-2-Globulin: 18.4 — ABNORMAL HIGH
Beta 2: 6.1
Beta Globulin: 6.6
Gamma Globulin: 16.9
Total Protein ELP: 6

## 2011-01-09 LAB — IMMUNOFIXATION ELECTROPHORESIS
IgA: 313
IgG (Immunoglobin G), Serum: 1200
IgM, Serum: 59 — ABNORMAL LOW
Total Protein ELP: 6.7

## 2011-01-09 LAB — NA AND K (SODIUM & POTASSIUM), RAND UR
Potassium Urine: 16
Sodium, Ur: 37

## 2011-01-09 LAB — PROTIME-INR
INR: 1.2
Prothrombin Time: 15.8 — ABNORMAL HIGH

## 2011-01-09 LAB — OCCULT BLOOD X 1 CARD TO LAB, STOOL
Fecal Occult Bld: NEGATIVE
Fecal Occult Bld: NEGATIVE

## 2011-01-09 LAB — PTH, INTACT AND CALCIUM
Calcium, Total (PTH): 7.5 — ABNORMAL LOW
PTH: 317.4 — ABNORMAL HIGH

## 2011-01-09 LAB — IGG, IGA, IGM
IgA: 275
IgG (Immunoglobin G), Serum: 1110
IgM, Serum: 50 — ABNORMAL LOW

## 2011-01-09 LAB — C3 COMPLEMENT: C3 Complement: 145

## 2011-01-09 LAB — VITAMIN B12: Vitamin B-12: 254 (ref 211–911)

## 2011-01-09 LAB — B-NATRIURETIC PEPTIDE (CONVERTED LAB): Pro B Natriuretic peptide (BNP): 80.3

## 2011-01-09 LAB — IRON AND TIBC
Iron: 14 — ABNORMAL LOW
Saturation Ratios: 7 — ABNORMAL LOW
TIBC: 206 — ABNORMAL LOW
UIBC: 192

## 2011-01-09 LAB — PHOSPHORUS
Phosphorus: 7.7 — ABNORMAL HIGH
Phosphorus: 7.8 — ABNORMAL HIGH

## 2011-01-09 LAB — COMPLEMENT, TOTAL: Compl, Total (CH50): 95 U/mL — ABNORMAL HIGH (ref 31–66)

## 2011-01-09 LAB — CULTURE, BLOOD (ROUTINE X 2)
Culture: NO GROWTH
Report Status: 7302008

## 2011-01-09 LAB — APTT: aPTT: 28

## 2011-01-09 LAB — IMMUNOFIXATION ADD-ON

## 2011-01-09 LAB — CHLORIDE, URINE, RANDOM: Chloride Urine: 27

## 2011-01-09 LAB — FOLATE RBC: RBC Folate: 610 — ABNORMAL HIGH

## 2011-01-09 LAB — CREATININE, URINE, RANDOM: Creatinine, Urine: 38.53

## 2011-01-09 LAB — ANTI-NEUTROPHIL ANTIBODY: Cytoplasmic Neutrophilic Ab: <1:20 {titer}

## 2011-01-09 LAB — ANA: Anti Nuclear Antibody(ANA): NEGATIVE

## 2011-01-09 LAB — FERRITIN: Ferritin: 376 — ABNORMAL HIGH (ref 10–291)

## 2011-01-09 LAB — TSH: TSH: 1.261

## 2011-01-09 LAB — C4 COMPLEMENT: Complement C4, Body Fluid: 24

## 2011-01-09 LAB — LIPASE, BLOOD: Lipase: 35

## 2011-02-04 ENCOUNTER — Encounter: Payer: Self-pay | Admitting: *Deleted

## 2011-02-04 ENCOUNTER — Emergency Department (INDEPENDENT_AMBULATORY_CARE_PROVIDER_SITE_OTHER)
Admission: EM | Admit: 2011-02-04 | Discharge: 2011-02-04 | Disposition: A | Discharge status: Home / Self Care | Payer: Medicare Other | Source: Home / Self Care

## 2011-02-04 DIAGNOSIS — N390 Urinary tract infection, site not specified: Secondary | ICD-10-CM

## 2011-02-04 HISTORY — DX: Cerebral infarction, unspecified: I63.9

## 2011-02-04 HISTORY — DX: Hyperlipidemia, unspecified: E78.5

## 2011-02-04 HISTORY — DX: Gastro-esophageal reflux disease without esophagitis: K21.9

## 2011-02-04 HISTORY — DX: Urinary tract infection, site not specified: N39.0

## 2011-02-04 HISTORY — DX: Essential (primary) hypertension: I10

## 2011-02-04 LAB — POCT URINALYSIS DIP (DEVICE)
Bilirubin Urine: NEGATIVE
Glucose, UA: NEGATIVE mg/dL
Ketones, ur: NEGATIVE mg/dL
Nitrite: NEGATIVE
Protein, ur: NEGATIVE mg/dL
Specific Gravity, Urine: 1.02 (ref 1.005–1.030)
Urobilinogen, UA: 1 mg/dL (ref 0.0–1.0)
pH: 7 (ref 5.0–8.0)

## 2011-02-04 MED ORDER — NITROFURANTOIN MONOHYD MACRO 100 MG PO CAPS: 100.0000 mg | ORAL_CAPSULE | Freq: Two times a day (BID) | ORAL | Status: AC

## 2011-02-04 MED ORDER — PHENAZOPYRIDINE HCL 200 MG PO TABS: 200.0000 mg | ORAL_TABLET | Freq: Three times a day (TID) | ORAL | Status: AC | PRN

## 2011-02-04 NOTE — ED Notes (Signed)
C/O urinary urgency and foul smell to urine.  Denies any dysuria; denies abd or back pain.

## 2011-02-04 NOTE — ED Provider Notes (Signed)
History     CSN: [id number] Arrival date & time: 02/04/2011  1:08 PM   First MD Initiated Contact with Patient 02/04/11 1309      Chief Complaint  Patient presents with  . Urinary Urgency    Malodorous Urine    (Consider location/radiation/quality/duration/timing/severity/associated sxs/prior treatment) Patient is a 77 y.o. female presenting with frequency. The history is provided by the patient. No language interpreter was used.  Urinary Frequency This is a recurrent problem. The current episode started more than 2 days ago. The problem has not changed since onset.Pertinent negatives include no abdominal pain. The symptoms are aggravated by nothing. The symptoms are relieved by nothing. She has tried nothing for the symptoms.   Pt with urinary urgency, frequency, oderous, cloudy urine x 1 week. No hematuria, dysuria. No abd pain, back pain, fevers, N/V, vaginal bleeding, vaginal d/c. Has h/o recurrent uti's. Was tx'd with macrobid 6 weeks ago. Pt also c/o flat annular nontender nonitchy rash on right face x 1 month. Has been applying ketoconazole to it in past with temporary resolution. Has not tried anything for it recently. No other rashes.   Past Medical History  Diagnosis Date  . Diabetes mellitus   . Hypertension   . Stroke   . GERD (gastroesophageal reflux disease)   . Hyperlipidemia   . Renal failure, acute   . UTI (lower urinary tract infection)     Past Surgical History  Procedure Date  . Cholecystectomy     History reviewed. No pertinent family history.  History  Substance Use Topics  . Smoking status: Not on file  . Smokeless tobacco: Not on file  . Alcohol Use: No    OB History    Grav Para Term Preterm Abortions TAB SAB Ect Mult Living                  Review of Systems  Constitutional: Negative for fever.  Gastrointestinal: Negative for nausea, vomiting and abdominal pain.  Genitourinary: Positive for dysuria, urgency and frequency. Negative for  hematuria, flank pain, vaginal bleeding, vaginal discharge and pelvic pain.  Musculoskeletal: Negative for myalgias and back pain.  Skin: Negative for rash.    Allergies  Ciprofloxacin; Diphenhydramine hcl; and Sulfonamide derivatives  Home Medications   Current Outpatient Rx  Name Route Sig Dispense Refill  . AMLODIPINE BESYLATE 5 MG PO TABS Oral Take 5 mg by mouth daily.      . AGGRENOX PO Oral Take by mouth.      . ATORVASTATIN CALCIUM PO Oral Take by mouth.      Marland Kitchen KETOCONAZOLE 2 % EX CREA Topical Apply topically 2 (two) times daily.      Marland Kitchen LISINOPRIL PO Oral Take by mouth.      Marland Kitchen LORAZEPAM 1 MG PO TABS Oral Take 1 mg by mouth 3 (three) times daily as needed.      Marland Kitchen METFORMIN HCL 500 MG PO TABS Oral Take 500 mg by mouth daily with breakfast.      . OMEPRAZOLE PO Oral Take by mouth.      . SODIUM BICARBONATE 325 MG PO TABS Oral Take 650 mg by mouth 3 (three) times daily.      Marland Kitchen NITROFURANTOIN MONOHYD MACRO 100 MG PO CAPS Oral Take 1 capsule (100 mg total) by mouth 2 (two) times daily. 10 capsule 0  . PHENAZOPYRIDINE HCL 200 MG PO TABS Oral Take 1 tablet (200 mg total) by mouth 3 (three) times daily as needed for pain.  6 tablet 0    BP 162/82  Pulse 88  Temp(Src) 98.1 F (36.7 C) (Oral)  Resp 12  SpO2 97%  Physical Exam  Nursing note and vitals reviewed. Constitutional: She is oriented to person, place, and time. She appears well-developed and well-nourished. No distress.  HENT:  Head: Normocephalic and atraumatic.  Eyes: EOM are normal. Pupils are equal, round, and reactive to light.  Neck: Normal range of motion.  Cardiovascular: Normal rate, regular rhythm and normal heart sounds.   Pulmonary/Chest: Effort normal and breath sounds normal.  Abdominal: Soft. Bowel sounds are normal. She exhibits no distension. There is no tenderness. There is no rebound, no guarding and no CVA tenderness.  Musculoskeletal: Normal range of motion.  Neurological: She is alert and oriented  to person, place, and time.  Skin: Skin is warm and dry.       Flat annular nontender rash on right check c/w tinea  Psychiatric: She has a normal mood and affect. Her behavior is normal. Judgment and thought content normal.    ED Course  Procedures (including critical care time)  Labs Reviewed  POCT URINALYSIS DIP (DEVICE) - Abnormal; Notable for the following:    Hgb urine dipstick TRACE (*)    Leukocytes, UA TRACE (*) Biochemical Testing Only. Please order routine urinalysis from main lab if confirmatory testing is needed.   All other components within normal limits  POCT URINALYSIS DIPSTICK  URINALYSIS, ROUTINE W REFLEX MICROSCOPIC  URINE CULTURE   Results for orders placed during the hospital encounter of 02/04/11  POCT URINALYSIS DIP (DEVICE)      Component Value Range   Glucose, UA NEGATIVE  NEGATIVE (mg/dL)   Bilirubin Urine NEGATIVE  NEGATIVE    Ketones, ur NEGATIVE  NEGATIVE (mg/dL)   Specific Gravity, Urine 1.020  1.005 - 1.030    Hgb urine dipstick TRACE (*) NEGATIVE    pH 7.0  5.0 - 8.0    Protein, ur NEGATIVE  NEGATIVE (mg/dL)   Urobilinogen, UA 1.0  0.0 - 1.0 (mg/dL)   Nitrite NEGATIVE  NEGATIVE    Leukocytes, UA TRACE (*) NEGATIVE    Sent urine off for micro, C&S.   1. UTI (lower urinary tract infection)       MDM          Renato Gails [facility] 02/04/11 1449

## 2011-02-08 LAB — URINE CULTURE
Colony Count: >=100000
Culture  Setup Time: 201211111047

## 2011-02-10 ENCOUNTER — Telehealth (HOSPITAL_COMMUNITY): Payer: Self-pay | Admitting: Emergency Medicine

## 2011-02-10 MED ORDER — CEPHALEXIN 500 MG PO CAPS: 500.0000 mg | ORAL_CAPSULE | Freq: Three times a day (TID) | ORAL | Status: AC

## 2011-02-10 NOTE — ED Notes (Signed)
I asked the pharmacist to inform the doctor we called her a Rx. of Keflex tonight if he tries to call in a Rx. Tomorrow. I also asked the pt. to start Rx. tonight and call her doctor tomorrow and let him know we started tx. with Keflex.  Unable to include all that in telephone note. Roselyn Meier 02/10/2011

## 2011-02-13 ENCOUNTER — Telehealth (HOSPITAL_COMMUNITY): Payer: Self-pay | Admitting: Emergency Medicine

## 2012-01-24 ENCOUNTER — Other Ambulatory Visit (HOSPITAL_COMMUNITY): Payer: Self-pay | Admitting: Nephrology

## 2012-01-24 DIAGNOSIS — N289 Disorder of kidney and ureter, unspecified: Secondary | ICD-10-CM

## 2012-01-30 ENCOUNTER — Other Ambulatory Visit (HOSPITAL_COMMUNITY): Payer: Self-pay | Admitting: Internal Medicine

## 2012-01-30 DIAGNOSIS — Z139 Encounter for screening, unspecified: Secondary | ICD-10-CM

## 2012-02-08 ENCOUNTER — Ambulatory Visit (HOSPITAL_COMMUNITY)
Admission: RE | Admit: 2012-02-08 | Discharge: 2012-02-08 | Disposition: A | Discharge status: Home / Self Care | Payer: Medicare Other | Source: Ambulatory Visit | Attending: Internal Medicine | Admitting: Internal Medicine

## 2012-02-08 DIAGNOSIS — M949 Disorder of cartilage, unspecified: Secondary | ICD-10-CM | POA: Insufficient documentation

## 2012-02-08 DIAGNOSIS — Z139 Encounter for screening, unspecified: Secondary | ICD-10-CM

## 2012-02-08 DIAGNOSIS — Z1231 Encounter for screening mammogram for malignant neoplasm of breast: Secondary | ICD-10-CM | POA: Insufficient documentation

## 2012-02-08 DIAGNOSIS — M899 Disorder of bone, unspecified: Secondary | ICD-10-CM | POA: Insufficient documentation

## 2012-02-12 ENCOUNTER — Ambulatory Visit (HOSPITAL_COMMUNITY)
Admission: RE | Admit: 2012-02-12 | Discharge: 2012-02-12 | Disposition: A | Discharge status: Home / Self Care | Payer: Medicare Other | Source: Ambulatory Visit | Attending: Nephrology | Admitting: Nephrology

## 2012-02-12 DIAGNOSIS — N289 Disorder of kidney and ureter, unspecified: Secondary | ICD-10-CM

## 2012-02-19 ENCOUNTER — Encounter: Payer: Self-pay | Admitting: Family Medicine

## 2012-02-19 ENCOUNTER — Ambulatory Visit (INDEPENDENT_AMBULATORY_CARE_PROVIDER_SITE_OTHER): Payer: Medicare Other | Admitting: Family Medicine

## 2012-02-19 VITALS — BP 150/82 | HR 69 | Resp 15 | Ht 60.5 in | Wt 164.1 lb

## 2012-02-19 DIAGNOSIS — R609 Edema, unspecified: Secondary | ICD-10-CM

## 2012-02-19 DIAGNOSIS — F419 Anxiety disorder, unspecified: Secondary | ICD-10-CM

## 2012-02-19 DIAGNOSIS — I10 Essential (primary) hypertension: Secondary | ICD-10-CM

## 2012-02-19 DIAGNOSIS — N189 Chronic kidney disease, unspecified: Secondary | ICD-10-CM

## 2012-02-19 DIAGNOSIS — F341 Dysthymic disorder: Secondary | ICD-10-CM

## 2012-02-19 DIAGNOSIS — J310 Chronic rhinitis: Secondary | ICD-10-CM

## 2012-02-19 DIAGNOSIS — E119 Type 2 diabetes mellitus without complications: Secondary | ICD-10-CM

## 2012-02-19 DIAGNOSIS — R51 Headache: Secondary | ICD-10-CM

## 2012-02-19 DIAGNOSIS — F329 Major depressive disorder, single episode, unspecified: Secondary | ICD-10-CM

## 2012-02-19 DIAGNOSIS — F32A Depression, unspecified: Secondary | ICD-10-CM

## 2012-02-19 MED ORDER — FLUTICASONE PROPIONATE 50 MCG/ACT NA SUSP: 2.0000 | Freq: Every day | NASAL | Status: DC

## 2012-02-19 MED ORDER — ASPIRIN-DIPYRIDAMOLE ER 25-200 MG PO CP12: 1.0000 | ORAL_CAPSULE | Freq: Two times a day (BID) | ORAL | Status: DC

## 2012-02-19 MED ORDER — ATORVASTATIN CALCIUM 20 MG PO TABS: 20.0000 mg | ORAL_TABLET | Freq: Every day | ORAL | Status: DC

## 2012-02-19 MED ORDER — LORAZEPAM 1 MG PO TABS: 1.0000 mg | ORAL_TABLET | Freq: Three times a day (TID) | ORAL | Status: DC | PRN

## 2012-02-19 NOTE — Progress Notes (Signed)
  Subjective:    Patient ID: [name], female    DOB: 1930/09/05, 78 y.o.   MRN: [id number]  HPI  Patient here to establish care. Previous PCP [facility] Nephrology - Dr. Hinda Lenis Optho- Dr. Drema Dallas ([place]) Medications and history reviewed She's history of chronic kidney disease does not know specifics. Typically her daughter [name] he was an Therapist, sports at [employer] takes her to her appointments. She states dialysis was mentioned however she is not currently on dialysis. She's had leg swelling for the past few months. She denies chest pain or shortness of breath. She tries to elevate her legs some. Pressure across her on his in nasal drainage for the past 2 months on and off. She did not discuss this with her previous PCP. In the morning she has a lot of drainage down her nose and on her throat. She denies any cough or fever. Denies any change in her vision. She is overdue for vision check , with Dr. Drema Dallas   Review of Systems  GEN- denies fatigue, fever, weight loss,weakness, recent illness HEENT- denies eye drainage, change in vision, +nasal discharge, CVS- denies chest pain, palpitations RESP- denies SOB, cough, wheeze ABD- denies N/V, change in stools, abd pain GU- denies dysuria, hematuria, dribbling, incontinence MSK- denies joint pain, muscle aches, injury Neuro- +headache, dizziness, syncope, seizure activity      Objective:   Physical Exam GEN- NAD, alert and oriented x3 HEENT- PERRL, EOMI, non injected sclera, pink conjunctiva, MMM, oropharynx clear, TM clear bilaterally, nares-clear rhinorrhea, fundoscopic exam hazy, no papilledema Neck- Supple, CVS- RRR, no murmur RESP-CTAB ABD- NABS, soft, NT, ND EXT- 1+ pdeal edema Pulses- Radial, DP- 2+ Neuro- CNII-XII in tact no focal deficits        Assessment & Plan:

## 2012-02-19 NOTE — Patient Instructions (Signed)
I will get records from the kidney doctor and Gardiner Barefoot to be sent in for the leg swelling Use the nose spray in the morning for the drainage  schedule eye doctor appointment with Dr. Drema Dallas  Continue current medications  F/U 6 weeks

## 2012-02-20 ENCOUNTER — Telehealth: Payer: Self-pay | Admitting: Family Medicine

## 2012-02-20 DIAGNOSIS — R519 Headache, unspecified: Secondary | ICD-10-CM | POA: Insufficient documentation

## 2012-02-20 DIAGNOSIS — F32A Depression, unspecified: Secondary | ICD-10-CM | POA: Insufficient documentation

## 2012-02-20 DIAGNOSIS — E119 Type 2 diabetes mellitus without complications: Secondary | ICD-10-CM | POA: Insufficient documentation

## 2012-02-20 DIAGNOSIS — F419 Anxiety disorder, unspecified: Secondary | ICD-10-CM | POA: Insufficient documentation

## 2012-02-20 NOTE — Assessment & Plan Note (Signed)
With her renal disease although not start her on a diuretic today. Advise her to elevate the legs. Compression hose were sent over

## 2012-02-20 NOTE — Assessment & Plan Note (Signed)
Currently on lorazepam which she takes once a day typically. She's been under the care of her children for the past 14 years when her husband died

## 2012-02-20 NOTE — Assessment & Plan Note (Signed)
Will treat for rhinitis with Flonase and see how symptoms go. She does not appear to have acute infection requiring antibiotics.

## 2012-02-20 NOTE — Assessment & Plan Note (Signed)
CKG stage III per her nephrology notes. There was some discussion per the patient not dialysis however this was not mentioned in his I have called her daughter I am awaiting her call to discuss her current treatments.

## 2012-02-20 NOTE — Assessment & Plan Note (Signed)
She's getting mild headaches mostly associated with a sinus pressure and drainage that she gets in the morning. She's no focal deficits. I will tr rhinitis and sinus pressure per above. She will also followup with her eye doctor. She can use Tylenol as needed

## 2012-02-20 NOTE — Assessment & Plan Note (Signed)
Blood pressure is little elevated today. Of note her nephrology note states that she is supposed to be on Diovan however she is labetalol. I'll need to confer with her previous PCP to see if there are recent medication changes

## 2012-02-20 NOTE — Assessment & Plan Note (Signed)
Her chart notes that she is diabetic her nephrology note states that she was on metformin and was asking for her PCP changes however she has no diabetic medications with her today.

## 2012-02-20 NOTE — Telephone Encounter (Signed)
Call made to pt daughter [name] ? On allergies ? About DM noted on nephrologist note HTN- nephrologist note from Oct states pt on Diovan, she has labetalol in bag ? Talks of dialysis She needs compression hose which have been ordered for her leg swelling  Will await call back

## 2012-02-26 NOTE — Telephone Encounter (Signed)
Nurse spoke with pt daughter Vivien Rota- Late entry Taken off Metformin secondary to DM and Cr, A1C was stable between 5-6 No talks of HD at this time

## 2012-03-05 ENCOUNTER — Encounter: Payer: Self-pay | Admitting: Family Medicine

## 2012-03-05 ENCOUNTER — Ambulatory Visit (INDEPENDENT_AMBULATORY_CARE_PROVIDER_SITE_OTHER): Payer: Medicare Other | Admitting: Family Medicine

## 2012-03-05 VITALS — BP 140/82 | HR 55 | Resp 15 | Wt 165.0 lb

## 2012-03-05 DIAGNOSIS — K5289 Other specified noninfective gastroenteritis and colitis: Secondary | ICD-10-CM

## 2012-03-05 DIAGNOSIS — I10 Essential (primary) hypertension: Secondary | ICD-10-CM

## 2012-03-05 DIAGNOSIS — E119 Type 2 diabetes mellitus without complications: Secondary | ICD-10-CM

## 2012-03-05 DIAGNOSIS — R51 Headache: Secondary | ICD-10-CM

## 2012-03-05 DIAGNOSIS — E785 Hyperlipidemia, unspecified: Secondary | ICD-10-CM

## 2012-03-05 DIAGNOSIS — K529 Noninfective gastroenteritis and colitis, unspecified: Secondary | ICD-10-CM

## 2012-03-05 DIAGNOSIS — Z8673 Personal history of transient ischemic attack (TIA), and cerebral infarction without residual deficits: Secondary | ICD-10-CM

## 2012-03-05 LAB — COMPREHENSIVE METABOLIC PANEL
ALT: 37 U/L — ABNORMAL HIGH (ref 0–35)
AST: 33 U/L (ref 0–37)
Albumin: 3.9 g/dL (ref 3.5–5.2)
Alkaline Phosphatase: 78 U/L (ref 39–117)
BUN: 23 mg/dL (ref 6–23)
CO2: 26 mEq/L (ref 19–32)
Calcium: 8.8 mg/dL (ref 8.4–10.5)
Chloride: 108 mEq/L (ref 96–112)
Creat: 1.2 mg/dL — ABNORMAL HIGH (ref 0.50–1.10)
Glucose, Bld: 97 mg/dL (ref 70–99)
Potassium: 4.6 mEq/L (ref 3.5–5.3)
Sodium: 139 mEq/L (ref 135–145)
Total Bilirubin: 0.4 mg/dL (ref 0.3–1.2)
Total Protein: 6.9 g/dL (ref 6.0–8.3)

## 2012-03-05 LAB — CBC WITH DIFFERENTIAL/PLATELET
Basophils Absolute: 0 10*3/uL (ref 0.0–0.1)
Basophils Relative: 0 % (ref 0–1)
Eosinophils Absolute: 0.2 10*3/uL (ref 0.0–0.7)
Eosinophils Relative: 5 % (ref 0–5)
HCT: 41.7 % (ref 36.0–46.0)
Hemoglobin: 13.5 g/dL (ref 12.0–15.0)
Lymphocytes Relative: 40 % (ref 12–46)
Lymphs Abs: 2.1 10*3/uL (ref 0.7–4.0)
MCH: 30.7 pg (ref 26.0–34.0)
MCHC: 32.4 g/dL (ref 30.0–36.0)
MCV: 94.8 fL (ref 78.0–100.0)
Monocytes Absolute: 0.6 10*3/uL (ref 0.1–1.0)
Monocytes Relative: 12 % (ref 3–12)
Neutro Abs: 2.2 10*3/uL (ref 1.7–7.7)
Neutrophils Relative %: 43 % (ref 43–77)
Platelets: 235 10*3/uL (ref 150–400)
RBC: 4.4 MIL/uL (ref 3.87–5.11)
RDW: 14 % (ref 11.5–15.5)
WBC: 5.2 10*3/uL (ref 4.0–10.5)

## 2012-03-05 LAB — HEMOGLOBIN A1C
Hgb A1c MFr Bld: 6.1 % — ABNORMAL HIGH (ref <5.7)
Mean Plasma Glucose: 128 mg/dL — ABNORMAL HIGH (ref <117)

## 2012-03-05 LAB — LIPID PANEL
Cholesterol: 139 mg/dL (ref 0–200)
HDL: 53 mg/dL (ref >39)
LDL Cholesterol: 67 mg/dL (ref 0–99)
Total CHOL/HDL Ratio: 2.6 Ratio
Triglycerides: 93 mg/dL (ref <150)
VLDL: 19 mg/dL (ref 0–40)

## 2012-03-05 LAB — GLUCOSE, POCT (MANUAL RESULT ENTRY): POC Glucose: 102 mg/dl — AB (ref 70–99)

## 2012-03-05 NOTE — Assessment & Plan Note (Signed)
Taken off meds due to CKD, check A1C, CBG in office wnl

## 2012-03-05 NOTE — Assessment & Plan Note (Signed)
Her BP looks okay today, I will have her monitor at home, we will adjust meds as needed No records received yet from Previous PCP

## 2012-03-05 NOTE — Assessment & Plan Note (Signed)
HA very concerning despite treatment for rhinitis, she is not one to complain per family. I will obtain MRI brain, she will also see her eye doctor to have evaluation for glaucoma

## 2012-03-05 NOTE — Patient Instructions (Signed)
Get the labs done this morning  MRI of brain to be done Take blood pressure at home and record I will call on Friday to see if more medications needed Keep f/u appt scheduled

## 2012-03-05 NOTE — Assessment & Plan Note (Signed)
Now resolved, benign abd exam

## 2012-03-05 NOTE — Progress Notes (Signed)
  Subjective:    Patient ID: [name], female    DOB: 1930-10-02, 78 y.o.   MRN: [id number]  HPI  Patient here for interim visit for headache and abdominal pain she establish care 2 weeks ago. States last week that she had lower, cramping associated with diarrhea and fatigue. She had positive sick contacts in her house. The diarrhea and abdominal pain only lasted one day it is now resolved. She continues to have pain across her head though no change in speech or eyesight. She is using a nasal spray which is help with the drainage but has not helped her headache. She does have appointment with her eye doctor tomorrow. She states that the headache makes her very fatigued.   Review of Systems  GEN- denies fatigue, fever, weight loss,weakness, recent illness HEENT- denies eye drainage, change in vision, nasal discharge, CVS- denies chest pain, palpitations RESP- denies SOB, cough, wheeze ABD- denies N/V, change in stools, abd pain GU- denies dysuria, hematuria, dribbling, incontinence MSK- denies joint pain, muscle aches, injury Neuro- + headache, dizziness, syncope, seizure activity      Objective:   Physical Exam GEN- NAD, alert and oriented x3 HEENT- PERRL, EOMI, non injected sclera, pink conjunctiva, MMM, oropharynx clear, fundoscopic exam hazy, = Neck- Supple, CVS- RRR, no murmur RESP-CTAB ABD- NABS, soft, NT, ND EXT- 1+ pedal edema Pulses- Radial, DP- 2+ Neuro- CNII-XII in tact no focal deficits        Assessment & Plan:

## 2012-03-08 ENCOUNTER — Ambulatory Visit (HOSPITAL_COMMUNITY)
Admission: RE | Admit: 2012-03-08 | Discharge: 2012-03-08 | Disposition: A | Discharge status: Home / Self Care | Payer: Medicare Other | Source: Ambulatory Visit | Attending: Family Medicine | Admitting: Family Medicine

## 2012-03-08 DIAGNOSIS — Z8673 Personal history of transient ischemic attack (TIA), and cerebral infarction without residual deficits: Secondary | ICD-10-CM

## 2012-03-08 DIAGNOSIS — I10 Essential (primary) hypertension: Secondary | ICD-10-CM | POA: Insufficient documentation

## 2012-03-08 DIAGNOSIS — G319 Degenerative disease of nervous system, unspecified: Secondary | ICD-10-CM | POA: Insufficient documentation

## 2012-03-08 DIAGNOSIS — R51 Headache: Secondary | ICD-10-CM | POA: Insufficient documentation

## 2012-03-08 DIAGNOSIS — E1169 Type 2 diabetes mellitus with other specified complication: Secondary | ICD-10-CM | POA: Insufficient documentation

## 2012-03-08 DIAGNOSIS — M47812 Spondylosis without myelopathy or radiculopathy, cervical region: Secondary | ICD-10-CM | POA: Insufficient documentation

## 2012-03-11 ENCOUNTER — Other Ambulatory Visit: Payer: Self-pay | Admitting: Family Medicine

## 2012-03-11 DIAGNOSIS — R937 Abnormal findings on diagnostic imaging of other parts of musculoskeletal system: Secondary | ICD-10-CM

## 2012-03-11 DIAGNOSIS — R51 Headache: Secondary | ICD-10-CM

## 2012-03-12 ENCOUNTER — Telehealth: Payer: Self-pay | Admitting: Family Medicine

## 2012-03-12 NOTE — Telephone Encounter (Signed)
Called and left message, pt had left work by the time I called I need to know what moms blood pressure has been running she may need second medicine and this may be cause of headaches MRI of brain shows changes from old stoke and having high blood pressure, but also noted some cervical stenosis and mild cord flattening which is concerning and she needs to see neurosurgery for this.

## 2012-03-13 ENCOUNTER — Telehealth: Payer: Self-pay | Admitting: Family Medicine

## 2012-03-13 MED ORDER — AMLODIPINE BESYLATE 5 MG PO TABS: 5.0000 mg | ORAL_TABLET | Freq: Every day | ORAL | Status: DC

## 2012-03-13 NOTE — Telephone Encounter (Signed)
Forwarded recorded blood pressure readings from daughter.

## 2012-03-13 NOTE — Telephone Encounter (Signed)
She is to start norvasc 5mg , take along with her labetalol  Medication sent to pharmacy

## 2012-03-14 ENCOUNTER — Telehealth: Payer: Self-pay | Admitting: Family Medicine

## 2012-03-14 NOTE — Telephone Encounter (Signed)
Spoke with pt given results, she is aware of neurosurgery appt, she is to get the norvasc and start this weekend for blood pressure, she did not see her eye doctor Tuesday, otherwise doing well

## 2012-03-15 NOTE — Telephone Encounter (Signed)
Patient aware.

## 2012-03-15 NOTE — Telephone Encounter (Signed)
Pt returning your call

## 2012-03-15 NOTE — Telephone Encounter (Signed)
Spoke with daughter Carline Kinzer, given MRI results- voiced understanding aware mother to see neurosurgery for the flattening cord States she has had headaches for many  Years and always complains about it Also given lab results She asked for something for morning stiffness arthritis pain, advised for now 1 tylenol arthritis daily

## 2012-04-12 ENCOUNTER — Ambulatory Visit (INDEPENDENT_AMBULATORY_CARE_PROVIDER_SITE_OTHER): Payer: Medicare Other | Admitting: Family Medicine

## 2012-04-12 ENCOUNTER — Encounter: Payer: Self-pay | Admitting: Family Medicine

## 2012-04-12 VITALS — BP 154/70 | HR 60 | Resp 18 | Ht 60.5 in | Wt 161.1 lb

## 2012-04-12 DIAGNOSIS — R519 Headache, unspecified: Secondary | ICD-10-CM

## 2012-04-12 DIAGNOSIS — I10 Essential (primary) hypertension: Secondary | ICD-10-CM

## 2012-04-12 DIAGNOSIS — R51 Headache: Secondary | ICD-10-CM

## 2012-04-12 DIAGNOSIS — L600 Ingrowing nail: Secondary | ICD-10-CM

## 2012-04-12 MED ORDER — LABETALOL HCL 300 MG PO TABS: 300.0000 mg | ORAL_TABLET | Freq: Two times a day (BID) | ORAL | Status: DC

## 2012-04-12 MED ORDER — AMLODIPINE BESYLATE 10 MG PO TABS: 10.0000 mg | ORAL_TABLET | Freq: Every day | ORAL | Status: DC

## 2012-04-12 NOTE — Assessment & Plan Note (Signed)
Very thick ingrown nail this needs to be removed by podiatry will refer

## 2012-04-12 NOTE — Patient Instructions (Addendum)
For your amlodipine increased to 10mg   Continue labetolol twice a day  Referral to the foot doctor Continue Tylenol  F/U 3 months

## 2012-04-12 NOTE — Assessment & Plan Note (Signed)
Increase Norvasc to 10 mg daily she will continue the labetalol

## 2012-04-12 NOTE — Assessment & Plan Note (Signed)
Chronic headaches her MRI was reassuring and she's been evaluated by neurosurgery and no action is needed. She will continue to use Tylenol as needed I will also work to get her blood pressure down a little bit more

## 2012-04-12 NOTE — Progress Notes (Signed)
  Subjective:    Patient ID: [name], female    DOB: 1931-02-10, 79 y.o.   MRN: [id number]  HPI Patient here to follow chronic medical problems. She was seen by neurosurgery secondary to MRI findings concern first spinal stenosis with cord involvement in the cervical region. She's had headaches for many years states that they let up after she retired however have come back. Her headaches and not been daily and she feels somewhat across the front of her eyes which she takes Tylenol does improve some. She denies any nausea vomiting change in vision.  Hypertension her blood pressure at home has been 130s to 160s over 60s she did start the amlodipine 5 mg along with her labetalol and has not had any difficulty she's not had any change in her previous leg swelling.   Review of Systems   GEN- denies fatigue, fever, weight loss,weakness, recent illness HEENT- denies eye drainage, change in vision, nasal discharge, CVS- denies chest pain, palpitations RESP- denies SOB, cough, wheeze ABD- denies N/V, change in stools, abd pain GU- denies dysuria, hematuria, dribbling, incontinence MSK- denies joint pain, muscle aches, injury Neuro- + headache, dizziness, syncope, seizure activity      Objective:   Physical Exam GEN- NAD, alert and oriented x3 HEENT- PERRL, EOMI, non injected sclera, pink conjunctiva, MMM, oropharynx clear Neck- Supple, CVS- RRR, no murmur RESP-CTAB EXT- No edema Pulses- Radial, DP- 2+ Feet- Ingrown nails bialt great toes, thickened nails, + callus  Neuro- CNII-XII in tact, no deficits      Assessment & Plan:

## 2012-06-29 ENCOUNTER — Other Ambulatory Visit: Payer: Self-pay | Admitting: Family Medicine

## 2012-07-29 ENCOUNTER — Ambulatory Visit (INDEPENDENT_AMBULATORY_CARE_PROVIDER_SITE_OTHER): Payer: Medicare Other | Admitting: Family Medicine

## 2012-07-29 ENCOUNTER — Encounter: Payer: Self-pay | Admitting: Family Medicine

## 2012-07-29 VITALS — BP 126/72 | HR 64 | Resp 18 | Ht 60.5 in | Wt 164.0 lb

## 2012-07-29 DIAGNOSIS — R609 Edema, unspecified: Secondary | ICD-10-CM

## 2012-07-29 DIAGNOSIS — E119 Type 2 diabetes mellitus without complications: Secondary | ICD-10-CM

## 2012-07-29 DIAGNOSIS — IMO0002 Reserved for concepts with insufficient information to code with codable children: Secondary | ICD-10-CM

## 2012-07-29 DIAGNOSIS — M179 Osteoarthritis of knee, unspecified: Secondary | ICD-10-CM

## 2012-07-29 DIAGNOSIS — N189 Chronic kidney disease, unspecified: Secondary | ICD-10-CM

## 2012-07-29 DIAGNOSIS — M1710 Unilateral primary osteoarthritis, unspecified knee: Secondary | ICD-10-CM

## 2012-07-29 DIAGNOSIS — R600 Localized edema: Secondary | ICD-10-CM

## 2012-07-29 DIAGNOSIS — I10 Essential (primary) hypertension: Secondary | ICD-10-CM

## 2012-07-29 MED ORDER — ATORVASTATIN CALCIUM 20 MG PO TABS: 20.0000 mg | ORAL_TABLET | Freq: Every day | ORAL | Status: DC

## 2012-07-29 MED ORDER — LABETALOL HCL 300 MG PO TABS: 300.0000 mg | ORAL_TABLET | Freq: Two times a day (BID) | ORAL | Status: DC

## 2012-07-29 MED ORDER — AMLODIPINE BESYLATE 10 MG PO TABS: 10.0000 mg | ORAL_TABLET | Freq: Every day | ORAL | Status: DC

## 2012-07-29 MED ORDER — OMEPRAZOLE 20 MG PO CPDR: 20.0000 mg | DELAYED_RELEASE_CAPSULE | Freq: Every day | ORAL | Status: DC

## 2012-07-29 NOTE — Progress Notes (Signed)
  Subjective:    Patient ID: [name], female    DOB: 1931/03/17, 79 y.o.   MRN: [id number]  HPI  Patient here to follow chronic medical problems. Her only concern is stiffness in her knees and joint she was given what sounds like steroid injections by her previous primary care provider which helps he was also seen by ortho in the past. She denies any swelling or locking of the knee she denies any recent fall. Medications were reviewed she had one headache described as stuffiness and a sharp pain across her head but this resolved. She was seen by her nephrologist recently with blood work and was told everything was stable  She did not see podiatry toe pain improved- wants to wait  Review of Systems   GEN- denies fatigue, fever, weight loss,weakness, recent illness HEENT- denies eye drainage, change in vision, nasal discharge, CVS- denies chest pain, palpitations RESP- denies SOB, cough, wheeze ABD- denies N/V, change in stools, abd pain GU- denies dysuria, hematuria, dribbling, incontinence MSK- +joint pain, muscle aches, injury Neuro- denies headache, dizziness, syncope, seizure activity      Objective:   Physical Exam GEN- NAD, alert and oriented x3 HEENT- PERRL, EOMI, non injected sclera, pink conjunctiva, MMM, oropharynx clear, maxillary sinus NT, TM clear bilat Neck- Supple, no bruit CVS- RRR, no murmur RESP-CTAB EXT- Normal inspection knees, no effusion, decreased ROM, mild crepitus, ligaments in tact EXT- pedal edema Pulses- Radial, DP- 2+        Assessment & Plan:

## 2012-07-29 NOTE — Patient Instructions (Addendum)
Continue current medications Use the nose spray for the allergies Use the compression hose Take EX tylenol 2 tablets twice a day for arthritis pain Appointment with Ortho for shots in knees F/U 3 months at [facility]

## 2012-07-31 ENCOUNTER — Encounter: Payer: Self-pay | Admitting: Family Medicine

## 2012-07-31 DIAGNOSIS — M179 Osteoarthritis of knee, unspecified: Secondary | ICD-10-CM | POA: Insufficient documentation

## 2012-07-31 DIAGNOSIS — M1710 Unilateral primary osteoarthritis, unspecified knee: Secondary | ICD-10-CM | POA: Insufficient documentation

## 2012-07-31 NOTE — Assessment & Plan Note (Signed)
Diet controlled, A1C 6.1%

## 2012-07-31 NOTE — Assessment & Plan Note (Signed)
Last note obtained, stable, continue bicarb

## 2012-07-31 NOTE — Assessment & Plan Note (Signed)
Mild edema, has compression hose will use these and elevate feet

## 2012-07-31 NOTE — Assessment & Plan Note (Signed)
Well controlled 

## 2012-07-31 NOTE — Assessment & Plan Note (Signed)
Refer to ortho for evaluation and determine need for steroid injection Note DM not an issue

## 2012-08-14 ENCOUNTER — Encounter: Payer: Self-pay | Admitting: Family Medicine

## 2012-11-13 ENCOUNTER — Other Ambulatory Visit: Payer: Self-pay | Admitting: Family Medicine

## 2012-12-10 ENCOUNTER — Other Ambulatory Visit: Payer: Self-pay | Admitting: Family Medicine

## 2012-12-12 ENCOUNTER — Other Ambulatory Visit: Payer: Self-pay | Admitting: Family Medicine

## 2013-01-07 ENCOUNTER — Ambulatory Visit: Payer: Self-pay | Admitting: Family Medicine

## 2013-01-08 ENCOUNTER — Ambulatory Visit (INDEPENDENT_AMBULATORY_CARE_PROVIDER_SITE_OTHER): Payer: Medicare Other | Admitting: Family Medicine

## 2013-01-08 ENCOUNTER — Ambulatory Visit (HOSPITAL_COMMUNITY)
Admission: RE | Admit: 2013-01-08 | Discharge: 2013-01-08 | Disposition: A | Discharge status: Home / Self Care | Payer: Medicare Other | Source: Ambulatory Visit | Attending: Family Medicine | Admitting: Family Medicine

## 2013-01-08 ENCOUNTER — Ambulatory Visit: Payer: Self-pay | Admitting: Family Medicine

## 2013-01-08 ENCOUNTER — Encounter: Payer: Self-pay | Admitting: Family Medicine

## 2013-01-08 VITALS — BP 132/80 | HR 78 | Temp 98.3°F | Resp 18 | Wt 164.0 lb

## 2013-01-08 DIAGNOSIS — R059 Cough, unspecified: Secondary | ICD-10-CM

## 2013-01-08 DIAGNOSIS — J209 Acute bronchitis, unspecified: Secondary | ICD-10-CM

## 2013-01-08 DIAGNOSIS — E119 Type 2 diabetes mellitus without complications: Secondary | ICD-10-CM

## 2013-01-08 DIAGNOSIS — I10 Essential (primary) hypertension: Secondary | ICD-10-CM

## 2013-01-08 DIAGNOSIS — R918 Other nonspecific abnormal finding of lung field: Secondary | ICD-10-CM | POA: Insufficient documentation

## 2013-01-08 DIAGNOSIS — R05 Cough: Secondary | ICD-10-CM

## 2013-01-08 DIAGNOSIS — N189 Chronic kidney disease, unspecified: Secondary | ICD-10-CM

## 2013-01-08 LAB — BASIC METABOLIC PANEL
BUN: 19 mg/dL (ref 6–23)
CO2: 28 mEq/L (ref 19–32)
Calcium: 9.3 mg/dL (ref 8.4–10.5)
Chloride: 107 mEq/L (ref 96–112)
Creat: 1.38 mg/dL — ABNORMAL HIGH (ref 0.50–1.10)
Glucose, Bld: 107 mg/dL — ABNORMAL HIGH (ref 70–99)
Potassium: 4.5 mEq/L (ref 3.5–5.3)
Sodium: 144 mEq/L (ref 135–145)

## 2013-01-08 LAB — CBC WITH DIFFERENTIAL/PLATELET
Basophils Absolute: 0 10*3/uL (ref 0.0–0.1)
Basophils Relative: 0 % (ref 0–1)
Eosinophils Absolute: 0.6 10*3/uL (ref 0.0–0.7)
Eosinophils Relative: 9 % — ABNORMAL HIGH (ref 0–5)
HCT: 37.9 % (ref 36.0–46.0)
Hemoglobin: 12.5 g/dL (ref 12.0–15.0)
Lymphocytes Relative: 29 % (ref 12–46)
Lymphs Abs: 2 10*3/uL (ref 0.7–4.0)
MCH: 31.2 pg (ref 26.0–34.0)
MCHC: 33 g/dL (ref 30.0–36.0)
MCV: 94.5 fL (ref 78.0–100.0)
Monocytes Absolute: 0.8 10*3/uL (ref 0.1–1.0)
Monocytes Relative: 12 % (ref 3–12)
Neutro Abs: 3.3 10*3/uL (ref 1.7–7.7)
Neutrophils Relative %: 50 % (ref 43–77)
Platelets: 229 10*3/uL (ref 150–400)
RBC: 4.01 MIL/uL (ref 3.87–5.11)
RDW: 13.9 % (ref 11.5–15.5)
WBC: 6.8 10*3/uL (ref 4.0–10.5)

## 2013-01-08 LAB — HEMOGLOBIN A1C
Hgb A1c MFr Bld: 6.3 % — ABNORMAL HIGH (ref <5.7)
Mean Plasma Glucose: 134 mg/dL — ABNORMAL HIGH (ref <117)

## 2013-01-08 MED ORDER — TRAMADOL HCL 50 MG PO TABS: 50.0000 mg | ORAL_TABLET | Freq: Two times a day (BID) | ORAL | Status: DC | PRN

## 2013-01-08 MED ORDER — OMEPRAZOLE 20 MG PO CPDR: 20.0000 mg | DELAYED_RELEASE_CAPSULE | Freq: Every day | ORAL | Status: DC

## 2013-01-08 MED ORDER — PROMETHAZINE-CODEINE 6.25-10 MG/5ML PO SYRP
5.0000 mL | ORAL_SOLUTION | ORAL | Status: DC | PRN
Start: 2013-01-08 — End: 2013-02-07

## 2013-01-08 MED ORDER — ALBUTEROL SULFATE HFA 108 (90 BASE) MCG/ACT IN AERS: 2.0000 | INHALATION_SPRAY | Freq: Four times a day (QID) | RESPIRATORY_TRACT | Status: DC | PRN

## 2013-01-08 NOTE — Progress Notes (Signed)
  Subjective:    Patient ID: [name], female    DOB: Aug 24, 1930, 79 y.o.   MRN: [id number]  HPI   Pt here with cough with production for the past 3 weeks. Started with sinus pressure and drainage on her trip to [place] 4 weeks ago. Was seen by Urgent Care in [place] given amoxicillin 1 week ago. Past week has noticed increase cough, unable to rest, no recent fever, no SOB, no wheezing. Used Robitussin DM over the counter with little improvement. Here with daughter today.   OA knees- today has no complaint of knee pain, wanted arthritis medication to try.  DM- diet controlled, due for A1C    Review of Systems  GEN-+ fatigue, fever, weight loss,weakness, recent illness HEENT- denies eye drainage, change in vision, +nasal discharge, CVS- denies chest pain, palpitations RESP- denies SOB,+ cough, wheeze ABD- denies N/V, change in stools, abd pain GU- denies dysuria, hematuria, dribbling, incontinence MSK- + joint pain, muscle aches, injury Neuro- denies headache, dizziness, syncope, seizure activity      Objective:   Physical Exam GEN- NAD, alert and oriented x3 HEENT- PERRL, EOMI, non injected sclera, pink conjunctiva, MMM, oropharynx mild erythema, +maxillary sinus tenderness, nares clear, TM clear bilat no effusion  Neck- Supple, no LAD CVS- RRR, no murmur RESP- mild rhonchi left base, normal WOB, no wheeze, no rales, harsh cough EXT- pedal edema Pulses- Radial, DP- 2+        Assessment & Plan:

## 2013-01-08 NOTE — Assessment & Plan Note (Signed)
Diet controlled, check A1C 

## 2013-01-08 NOTE — Assessment & Plan Note (Signed)
Trial of ultram

## 2013-01-08 NOTE — Assessment & Plan Note (Addendum)
CXR neg for infiltrate Will send in cough med with codiene Complete amoxicillin Albuterol prn

## 2013-01-08 NOTE — Assessment & Plan Note (Signed)
Well controlled, no change to meds 

## 2013-01-08 NOTE — Patient Instructions (Signed)
Take the ultram as directed for arthritis pain Call if they get worse and you want an injection in your knee Get the chest xray Cough medication called in Get your flu shot from pharmacy in 2 weeks after you are better F/U 4 months

## 2013-01-08 NOTE — Assessment & Plan Note (Signed)
Check mebtaolic panel

## 2013-01-10 ENCOUNTER — Telehealth: Payer: Self-pay | Admitting: Family Medicine

## 2013-01-10 NOTE — Telephone Encounter (Signed)
Daughter would like the results of her X-ray .

## 2013-01-12 ENCOUNTER — Other Ambulatory Visit: Payer: Self-pay | Admitting: Family Medicine

## 2013-01-12 NOTE — Telephone Encounter (Signed)
Pt was called on 10/15 day of xray , sabrina gave results, please see the message their

## 2013-01-13 ENCOUNTER — Encounter: Payer: Self-pay | Admitting: Family Medicine

## 2013-01-13 ENCOUNTER — Ambulatory Visit (INDEPENDENT_AMBULATORY_CARE_PROVIDER_SITE_OTHER): Payer: Medicare Other | Admitting: Family Medicine

## 2013-01-13 ENCOUNTER — Telehealth: Payer: Self-pay | Admitting: Family Medicine

## 2013-01-13 VITALS — BP 138/78 | HR 78 | Temp 98.0°F | Resp 20 | Wt 166.0 lb

## 2013-01-13 DIAGNOSIS — J209 Acute bronchitis, unspecified: Secondary | ICD-10-CM

## 2013-01-13 MED ORDER — PREDNISONE 10 MG PO TABS: 10.0000 mg | ORAL_TABLET | Freq: Two times a day (BID) | ORAL | Status: DC

## 2013-01-13 MED ORDER — METHYLPREDNISOLONE ACETATE 40 MG/ML IJ SUSP: 40.0000 mg | Freq: Once | INTRAMUSCULAR | Status: DC

## 2013-01-13 NOTE — Assessment & Plan Note (Signed)
Continue albuterol, cough medicine Will give Depo Medrol 40mg , she will then take prednisone 10mg  BID for the next 5 days No further antibiotics needed Cough will likely linger next few weeks, reiterated this to patient

## 2013-01-13 NOTE — Patient Instructions (Signed)
Continue albuterol Continue cough medication Push the fluids Your kidney levels look okay  Your infection fighting cells were normal Shot of depo Medrol today  Take the prednisone as directed 1 tablet twice a day for the next 5 days  Acute Bronchitis You have acute bronchitis. This means you have a chest cold. The airways in your lungs are red and sore (inflamed). Acute means it is sudden onset.  CAUSES Bronchitis is most often caused by the same virus that causes a cold. SYMPTOMS   Body aches.  Chest congestion.  Chills.  Cough.  Fever.  Shortness of breath.  Sore throat. TREATMENT  Acute bronchitis is usually treated with rest, fluids, and medicines for relief of fever or cough. Most symptoms should go away after a few days or a week. Increased fluids may help thin your secretions and will prevent dehydration. Your caregiver may give you an inhaler to improve your symptoms. The inhaler reduces shortness of breath and helps control cough. You can take over-the-counter pain relievers or cough medicine to decrease coughing, pain, or fever. A cool-air vaporizer may help thin bronchial secretions and make it easier to clear your chest. Antibiotics are usually not needed but can be prescribed if you smoke, are seriously ill, have chronic lung problems, are elderly, or you are at higher risk for developing complications.Allergies and asthma can make bronchitis worse. Repeated episodes of bronchitis may cause longstanding lung problems. Avoid smoking and secondhand smoke.Exposure to cigarette smoke or irritating chemicals will make bronchitis worse. If you are a cigarette smoker, consider using nicotine gum or skin patches to help control withdrawal symptoms. Quitting smoking will help your lungs heal faster. Recovery from bronchitis is often slow, but you should start feeling better after 2 to 3 days. Cough from bronchitis frequently lasts for 3 to 4 weeks. To prevent another bout of  acute bronchitis:  Quit smoking.  Wash your hands frequently to get rid of viruses or use a hand sanitizer.  Avoid other people with cold or virus symptoms.  Try not to touch your hands to your mouth, nose, or eyes. SEEK IMMEDIATE MEDICAL CARE IF:  You develop increased fever, chills, or chest pain.  You have severe shortness of breath or bloody sputum.  You develop dehydration, fainting, repeated vomiting, or a severe headache.  You have no improvement after 1 week of treatment or you get worse. MAKE SURE YOU:   Understand these instructions.  Will watch your condition.  Will get help right away if you are not doing well or get worse. Document Released: 04/20/2004 Document Revised: 06/05/2011 Document Reviewed: 07/06/2010 [facility] Patient Information 2014 [place], [place].

## 2013-01-13 NOTE — Telephone Encounter (Signed)
Left message on both work and home number

## 2013-01-13 NOTE — Telephone Encounter (Signed)
Please call [name] pt daughter [phone]  Please let her know her mother has bronchitis, she will continue with the inhaler and cough medication and I have given her a shot of steroids today as well  Please see if she wants to change off the aggrenox due to cost , her copay seems to be $45 dollars, otherwise there is no medical reason to switch her to plavix or another medication. If cost is the reason we can try plavix, this may be covered a little better  Effient is typically reserved for cardiac patients not stroke patients

## 2013-01-13 NOTE — Progress Notes (Signed)
  Subjective:    Patient ID: [name], female    DOB: 04/20/1930, 79 y.o.   MRN: [id number]  HPI  Patient your continued cough. She was seen on 10/15 and dairy to prolonged cough for the past 3 weeks. Chest x-ray was obtained which showed acute bronchitis without any sign of infiltrate or pneumonia. At that time she was taking amoxicillin prescribed for sinusitis given by an urgent care from [place] a week prior. She's completed antibiotics. She's using albuterol as well as cough medication with codeine which both are helping. She came in today with her son because the cough still persists even though the meds help some. For the past couple of days her cough has been productive of thick white sputum. She was asking for shot to be given as this has helped in the past. She's had a few episodes of wheezing but overall does not feel short of breath and has no chest pain. She's not had any recent fever.  There was also concern for one of her daughters about her Aggrenox it seems that the cost is expensive to the patient monthly.    Review of Systems  GEN- + fatigue, fever, weight loss,weakness, recent illness HEENT- denies eye drainage, change in vision, nasal discharge, CVS- denies chest pain, palpitations RESP- denies SOB, +cough, wheeze ABD- denies N/V, change in stools, abd pain GU- denies dysuria, hematuria, dribbling,+ incontinence w/ cough Neuro- denies headache, dizziness, syncope, seizure activity      Objective:   Physical Exam GEN- NAD, alert and oriented x3 HEENT- PERRL, EOMI, non injected sclera, pink conjunctiva, MMM, oropharynx clear, no maxillary sinus tenderness, nares clear rhinorrhea,  Neck- Supple, no LAD CVS- RRR, no murmur RESP- CTAB- no rhonchi, no rales, harsh cough, normal WOB EXT- pedal edema Pulses- Radial 2+       Assessment & Plan:   Labs reviewed with pt.

## 2013-01-15 ENCOUNTER — Ambulatory Visit: Payer: Medicare Other | Admitting: Physician Assistant

## 2013-01-15 NOTE — Telephone Encounter (Signed)
Tried calling daughter again at home and work [facility]

## 2013-01-16 NOTE — Telephone Encounter (Signed)
Daughter [name] called back and all the children has decided to keep her on Aggrenox

## 2013-01-16 NOTE — Telephone Encounter (Signed)
noted 

## 2013-01-23 ENCOUNTER — Other Ambulatory Visit: Payer: Self-pay | Admitting: Family Medicine

## 2013-01-23 NOTE — Telephone Encounter (Signed)
Medication refilled per protocol. 

## 2013-02-07 ENCOUNTER — Emergency Department (HOSPITAL_COMMUNITY)
Admission: EM | Admit: 2013-02-07 | Discharge: 2013-02-07 | Disposition: A | Discharge status: Home / Self Care | Payer: Medicare Other | Source: Home / Self Care | Attending: Emergency Medicine | Admitting: Emergency Medicine

## 2013-02-07 ENCOUNTER — Encounter (HOSPITAL_COMMUNITY): Payer: Self-pay | Admitting: Emergency Medicine

## 2013-02-07 DIAGNOSIS — R059 Cough, unspecified: Secondary | ICD-10-CM

## 2013-02-07 DIAGNOSIS — R05 Cough: Secondary | ICD-10-CM

## 2013-02-07 MED ORDER — ALBUTEROL SULFATE HFA 108 (90 BASE) MCG/ACT IN AERS: 2.0000 | INHALATION_SPRAY | Freq: Four times a day (QID) | RESPIRATORY_TRACT | Status: DC | PRN

## 2013-02-07 MED ORDER — PROMETHAZINE-CODEINE 6.25-10 MG/5ML PO SYRP: 5.0000 mL | ORAL_SOLUTION | ORAL | Status: DC | PRN

## 2013-02-07 NOTE — ED Provider Notes (Signed)
CSN: [id number]     Arrival date & time 02/07/13  1850 History   First MD Initiated Contact with Patient 02/07/13 1931-02-06     Chief Complaint  Patient presents with  . Cough   (Consider location/radiation/quality/duration/timing/severity/associated sxs/prior Treatment) HPI Patient is an 79 yo F presenting with cough x 7 weeks. Patient had sinusitis in early October, treated with antibiotics. Developed cough, CXR showed bronchitis. She was then treated with prednisone for the bronchitis. She also got cough medication with codeine with helped but she is now out of medication so she continues to cough. She is coughing up yellow sputum. No fevers/chills. She does not smoke. States she is not getting any better. She has not tried any OTC medications due to high blood pressure.   Past Medical History  Diagnosis Date  . Diabetes mellitus   . Hypertension   . Stroke   . GERD (gastroesophageal reflux disease)   . Hyperlipidemia   . UTI (lower urinary tract infection)   . CKD (chronic kidney disease)     Dr. Hinda Lenis  . Arthritis   . Anxiety   . Depression    Past Surgical History  Procedure Laterality Date  . Cholecystectomy     Family History  Problem Relation Age of Onset  . Cancer Mother   . Cancer Father    History  Substance Use Topics  . Smoking status: Never Smoker   . Smokeless tobacco: Not on file  . Alcohol Use: No   OB History   Grav Para Term Preterm Abortions TAB SAB Ect Mult Living                 Review of Systems  Constitutional: Negative for fever and chills.  HENT: Positive for postnasal drip and voice change. Negative for congestion, sinus pressure and sore throat.   Eyes: Negative for visual disturbance.  Respiratory: Positive for cough. Negative for choking, chest tightness, shortness of breath and wheezing.   Cardiovascular: Negative for chest pain and leg swelling.  Gastrointestinal: Negative for abdominal pain.  Genitourinary: Negative for dysuria.   Musculoskeletal: Negative for arthralgias and myalgias.  Skin: Negative for rash.  Neurological: Negative for headaches.    Allergies  Ciprofloxacin; Diphenhydramine hcl; and Sulfonamide derivatives  Home Medications   Current Outpatient Rx  Name  Route  Sig  Dispense  Refill  . AGGRENOX 25-200 MG per 12 hr capsule      TAKE ONE CAPSULE BY MOUTH TWICE DAILY   60 capsule   1   . albuterol (PROVENTIL HFA;VENTOLIN HFA) 108 (90 BASE) MCG/ACT inhaler   Inhalation   Inhale 2 puffs into the lungs every 6 (six) hours as needed (wheezing/cough).   1 Inhaler   0   . alendronate (FOSAMAX) 35 MG tablet   Oral   Take 35 mg by mouth every 7 (seven) days. Take with a full glass of water on an empty stomach.         Marland Kitchen amLODipine (NORVASC) 10 MG tablet   Oral   Take 1 tablet (10 mg total) by mouth daily.   30 tablet   3     Dose change   . amLODipine (NORVASC) 10 MG tablet      TAKE 1 TABLET BY MOUTH DAILY   30 tablet   0   . aspirin 81 MG tablet   Oral   Take 81 mg by mouth daily.         Marland Kitchen atorvastatin (LIPITOR) 20 MG  tablet      TAKE 1 TABLET BY MOUTH DAILY   30 tablet   0   . Cranberry-Cholecalciferol 4200-500 MG-UNIT CAPS   Oral   Take 1 tablet by mouth daily.         . fluticasone (FLONASE) 50 MCG/ACT nasal spray   Nasal   Place 2 sprays into the nose daily.   16 g   2     For sinuses   . labetalol (NORMODYNE) 300 MG tablet   Oral   Take 1 tablet (300 mg total) by mouth 2 (two) times daily.   60 tablet   3   . LORazepam (ATIVAN) 1 MG tablet   Oral   Take 1 tablet (1 mg total) by mouth 3 (three) times daily as needed.   90 tablet   1   . omeprazole (PRILOSEC) 20 MG capsule   Oral   Take 1 capsule (20 mg total) by mouth daily.   30 capsule   6   . predniSONE (DELTASONE) 10 MG tablet   Oral   Take 1 tablet (10 mg total) by mouth 2 (two) times daily.   10 tablet   0   . promethazine-codeine (PHENERGAN WITH CODEINE) 6.25-10 MG/5ML  syrup   Oral   Take 5 mLs by mouth every 4 (four) hours as needed for cough.   180 mL   0   . sodium bicarbonate 325 MG tablet   Oral   Take 650 mg by mouth 3 (three) times daily.           . sodium bicarbonate 650 MG tablet      TAKE 2 TABLETS BY MOUTH THREE TIMES DAILY   180 tablet   2   . traMADol (ULTRAM) 50 MG tablet   Oral   Take 1 tablet (50 mg total) by mouth 2 (two) times daily as needed for pain.   45 tablet   2    BP 151/75  Pulse 63  Temp(Src) 98 F (36.7 C) (Oral)  Resp 18  SpO2 98% Physical Exam  Constitutional: She is oriented to person, place, and time. She appears well-developed and well-nourished. No distress.  HENT:  Head: Normocephalic and atraumatic.  Posterior pharyngeal erythema  Eyes: Conjunctivae are normal. Pupils are equal, round, and reactive to light.  Neck: Normal range of motion. Neck supple.  Cardiovascular: Normal rate, regular rhythm and normal heart sounds.   Pulmonary/Chest: Effort normal and breath sounds normal. No respiratory distress. She has no wheezes. She exhibits no tenderness.  No coughing appreciated at all during my entire exam.  Abdominal: Soft. She exhibits no distension. There is no tenderness.  Lymphadenopathy:    She has no cervical adenopathy.  Neurological: She is alert and oriented to person, place, and time.  Skin: Skin is warm and dry. No rash noted.    ED Course  Procedures (including critical care time) Labs Review Labs Reviewed - No data to display Imaging Review No results found.  MDM   1. Cough    Most likely related to viral infection causing post-nasal drip or residual cough from bronchitis. Patient requesting refill on her Rx cough medication and inhaler. I have given her Rx for both of these. Explained to patient and her daughter that she does not have an indication for antibiotics, and this cough may last a little longer. CXR not obtained since her lungs are clear. F/u with Dr. Buelah Manis as  scheduled.    [employer],  MD 02/07/13 2048

## 2013-02-07 NOTE — ED Provider Notes (Signed)
Medical screening examination/treatment/procedure(s) were performed by a resident physician and as supervising physician I was immediately available for consultation/collaboration.  Philipp Deputy, [name]  Harden Mo, MD 02/07/13 [phone]

## 2013-02-07 NOTE — ED Notes (Signed)
Cough x 7 weeks; no better after completed 2 different antibiotics, prednisone

## 2013-02-10 ENCOUNTER — Ambulatory Visit (INDEPENDENT_AMBULATORY_CARE_PROVIDER_SITE_OTHER): Payer: Medicare Other | Admitting: Family Medicine

## 2013-02-10 ENCOUNTER — Encounter: Payer: Self-pay | Admitting: Family Medicine

## 2013-02-10 ENCOUNTER — Ambulatory Visit (HOSPITAL_COMMUNITY)
Admission: RE | Admit: 2013-02-10 | Discharge: 2013-02-10 | Disposition: A | Discharge status: Home / Self Care | Payer: Medicare Other | Source: Ambulatory Visit | Attending: Family Medicine | Admitting: Family Medicine

## 2013-02-10 VITALS — BP 138/80 | HR 70 | Temp 98.5°F | Resp 18 | Ht <= 58 in | Wt 159.0 lb

## 2013-02-10 DIAGNOSIS — R05 Cough: Secondary | ICD-10-CM | POA: Insufficient documentation

## 2013-02-10 DIAGNOSIS — J310 Chronic rhinitis: Secondary | ICD-10-CM

## 2013-02-10 DIAGNOSIS — J40 Bronchitis, not specified as acute or chronic: Secondary | ICD-10-CM | POA: Insufficient documentation

## 2013-02-10 DIAGNOSIS — J209 Acute bronchitis, unspecified: Secondary | ICD-10-CM

## 2013-02-10 DIAGNOSIS — J00 Acute nasopharyngitis [common cold]: Secondary | ICD-10-CM

## 2013-02-10 DIAGNOSIS — R059 Cough, unspecified: Secondary | ICD-10-CM | POA: Insufficient documentation

## 2013-02-10 NOTE — Assessment & Plan Note (Signed)
Pt initially improved now worsened with productive cough, though her exam looks good Will add mucinex Flonase for post nasal drip which is likley causing irritation as well Repeat CXR, then decide on antibiotics Continue cough meds

## 2013-02-10 NOTE — Patient Instructions (Signed)
Mucinex plain 1 tablet twice a day  Drink with full glass of water Continue cough medication Use albuterol as needed Use the Flonase for the nasal drip  Get the chest xray F/U 2 months regular appt

## 2013-02-10 NOTE — Progress Notes (Signed)
  Subjective:    Patient ID: [name], female    DOB: [birth year], 79 y.o.   MRN: [id number]  HPI Patient here to followup cough and bronchitis. She was seen about one month ago secondary to ongoing cough. Chest x-ray was obtained which showed bronchitis. She was treated with Augmentin which is been started by an urgent care prior to her visit. I then added on low-dose prednisone and albuterol. Her cough actually improved however worsened over the past couple weeks. She continues to have cough productive of yellow sputum. She denies any shortness of breath but has had some minimal wheezing. Her cough medicine does help. She was seen in urgent care on Friday secondary to cough. Her sats were normal and her exam was unremarkable. She was given a refill on her albuterol as well as her cough syrup. She also admits to some nasal drainage and some postnasal drip which also makes her cough. She does not use her Flonase which she has at home to  Review of Systems  GEN- denies fatigue, fever, weight loss,weakness, recent illness HEENT- denies eye drainage, change in vision,+ nasal discharge, CVS- denies chest pain, palpitations RESP- denies SOB, +cough, wheeze MSK- denies joint pain, muscle aches, injury Neuro- denies headache, dizziness, syncope, seizure activity         Objective:   Physical Exam  GEN- NAD, alert and oriented x3 HEENT- PERRL, EOMI, non injected sclera, pink conjunctiva, MMM, oropharynx clear, no maxillary sinus tenderness, nares clear rhinorrhea,  Neck- Supple, no LAD CVS- RRR, no murmur RESP- CTAB- no rhonchi, no rales, harsh cough,, no wheeze,  Normal WOB, normal sat EXT- pedal edema Pulses- Radial 2+       Assessment & Plan:

## 2013-02-10 NOTE — Assessment & Plan Note (Signed)
flonase to be restarted

## 2013-02-13 ENCOUNTER — Other Ambulatory Visit: Payer: Self-pay | Admitting: Family Medicine

## 2013-03-13 ENCOUNTER — Other Ambulatory Visit: Payer: Self-pay | Admitting: Family Medicine

## 2013-03-14 NOTE — Telephone Encounter (Signed)
Medication refilled per protocol. 

## 2013-03-15 ENCOUNTER — Other Ambulatory Visit: Payer: Self-pay | Admitting: Family Medicine

## 2013-04-14 ENCOUNTER — Ambulatory Visit: Payer: Medicare Other | Admitting: Family Medicine

## 2013-04-21 ENCOUNTER — Other Ambulatory Visit: Payer: Self-pay | Admitting: Family Medicine

## 2013-04-22 NOTE — Telephone Encounter (Signed)
Medication refilled per protocol. 

## 2013-04-27 ENCOUNTER — Other Ambulatory Visit: Payer: Self-pay | Admitting: Family Medicine

## 2013-05-13 ENCOUNTER — Other Ambulatory Visit: Payer: Self-pay | Admitting: Family Medicine

## 2013-05-14 ENCOUNTER — Encounter: Payer: Self-pay | Admitting: Family Medicine

## 2013-05-14 NOTE — Telephone Encounter (Signed)
Needs ov - will send letter

## 2013-05-24 ENCOUNTER — Other Ambulatory Visit: Payer: Self-pay | Admitting: Family Medicine

## 2013-05-27 ENCOUNTER — Other Ambulatory Visit: Payer: Self-pay | Admitting: *Deleted

## 2013-05-27 MED ORDER — ALENDRONATE SODIUM 35 MG PO TABS: ORAL_TABLET | ORAL | Status: DC

## 2013-05-27 NOTE — Telephone Encounter (Signed)
Refill appropriate and filled per protocol. 

## 2013-06-14 ENCOUNTER — Other Ambulatory Visit: Payer: Self-pay | Admitting: Family Medicine

## 2013-06-16 ENCOUNTER — Other Ambulatory Visit: Payer: Self-pay | Admitting: Family Medicine

## 2013-06-16 ENCOUNTER — Encounter: Payer: Self-pay | Admitting: Family Medicine

## 2013-06-16 ENCOUNTER — Ambulatory Visit (INDEPENDENT_AMBULATORY_CARE_PROVIDER_SITE_OTHER): Payer: Medicare Other | Admitting: Family Medicine

## 2013-06-16 VITALS — BP 138/76 | HR 62 | Temp 98.3°F | Resp 14 | Ht 59.5 in | Wt 161.0 lb

## 2013-06-16 DIAGNOSIS — E119 Type 2 diabetes mellitus without complications: Secondary | ICD-10-CM

## 2013-06-16 DIAGNOSIS — R32 Unspecified urinary incontinence: Secondary | ICD-10-CM

## 2013-06-16 DIAGNOSIS — Z1231 Encounter for screening mammogram for malignant neoplasm of breast: Secondary | ICD-10-CM

## 2013-06-16 DIAGNOSIS — M810 Age-related osteoporosis without current pathological fracture: Secondary | ICD-10-CM

## 2013-06-16 DIAGNOSIS — I10 Essential (primary) hypertension: Secondary | ICD-10-CM

## 2013-06-16 DIAGNOSIS — R600 Localized edema: Secondary | ICD-10-CM

## 2013-06-16 DIAGNOSIS — R609 Edema, unspecified: Secondary | ICD-10-CM

## 2013-06-16 DIAGNOSIS — Z23 Encounter for immunization: Secondary | ICD-10-CM

## 2013-06-16 LAB — CBC WITH DIFFERENTIAL/PLATELET
Basophils Absolute: 0.1 10*3/uL (ref 0.0–0.1)
Basophils Relative: 1 % (ref 0–1)
Eosinophils Absolute: 0.3 10*3/uL (ref 0.0–0.7)
Eosinophils Relative: 5 % (ref 0–5)
HCT: 40.2 % (ref 36.0–46.0)
Hemoglobin: 13.3 g/dL (ref 12.0–15.0)
Lymphocytes Relative: 36 % (ref 12–46)
Lymphs Abs: 2.1 10*3/uL (ref 0.7–4.0)
MCH: 31.2 pg (ref 26.0–34.0)
MCHC: 33.1 g/dL (ref 30.0–36.0)
MCV: 94.4 fL (ref 78.0–100.0)
Monocytes Absolute: 0.6 10*3/uL (ref 0.1–1.0)
Monocytes Relative: 11 % (ref 3–12)
Neutro Abs: 2.7 10*3/uL (ref 1.7–7.7)
Neutrophils Relative %: 47 % (ref 43–77)
Platelets: 238 10*3/uL (ref 150–400)
RBC: 4.26 MIL/uL (ref 3.87–5.11)
RDW: 14.2 % (ref 11.5–15.5)
WBC: 5.7 10*3/uL (ref 4.0–10.5)

## 2013-06-16 MED ORDER — TRAMADOL HCL 50 MG PO TABS: 50.0000 mg | ORAL_TABLET | Freq: Two times a day (BID) | ORAL | Status: DC | PRN

## 2013-06-16 MED ORDER — ALENDRONATE SODIUM 35 MG PO TABS: ORAL_TABLET | ORAL | Status: DC

## 2013-06-16 MED ORDER — AMLODIPINE BESYLATE 10 MG PO TABS: ORAL_TABLET | ORAL | Status: DC

## 2013-06-16 NOTE — Progress Notes (Signed)
Patient ID: [name], female   DOB: 1930-08-15, 80 y.o.   MRN: [id number]   Subjective:    Patient ID: [name], female    DOB: 12-23-1930, 81 y.o.   MRN: [id number]  Patient presents for F/u  Patient here to follow chronic medical problems. She does complain of some urinary incontinence when she sneezes or coughs also worse at night which she feels she cannot make it to the bathroom she was a medication to help with this. She continues to get some leg swelling on and off for typically her legs are down during the day. Her son who is with her today states that she does not wear her stockings on a regular basis but when she does wear them she does not have any problems with swelling   She is due for some labs today for her lunch sugar as well as chronic kidney failure. She does have follow with her nephrologist this summer.  Osteoporosis she is currently on Fosamax she is due for bone density as well as mammogram per the records.  Due for Prevnar 13   Review Of Systems:  GEN- denies fatigue, fever, weight loss,weakness, recent illness HEENT- denies eye drainage, change in vision, nasal discharge, CVS- denies chest pain, palpitations RESP- denies SOB, cough, wheeze ABD- denies N/V, change in stools, abd pain GU- denies dysuria, hematuria, dribbling, incontinence MSK- denies joint pain, muscle aches, injury Neuro- denies headache, dizziness, syncope, seizure activity       Objective:    BP 138/76  Pulse 62  Temp(Src) 98.3 F (36.8 C)  Resp 14  Ht 4' 11.5" (1.511 m)  Wt 161 lb (73.029 kg)  BMI 31.99 kg/m2 GEN- NAD, alert and oriented x3 HEENT- PERRL, EOMI, non injected sclera, pink conjunctiva, MMM, oropharynx clear Neck- Supple, no thyromegaly CVS- RRR, no murmur RESP-CTAB ABD-NABS,soft,NT,ND EXT- No edema Pulses- Radial, DP- 2+        Assessment & Plan:      Problem List Items Addressed This Visit   Type II or unspecified type diabetes mellitus  without mention of complication, not stated as uncontrolled - Primary     Diet controlled, check A1C    Relevant Orders      Hemoglobin A1c      Comprehensive metabolic panel      CBC with Differential      Lipid panel      Microalbumin / creatinine urine ratio   Peripheral edema     Continue compression hose, no diuretic needed at this time    HYPERTENSION     Well controlled    Relevant Medications      amLODIpine (NORVASC) tablet    Other Visit Diagnoses   Osteoporosis, unspecified        Bone Density to be done, on fosamax    Relevant Medications       Vitamin D, Cholecalciferol, 1000 UNITS TABS       alendronate (FOSAMAX) 35 MG tablet    Other Relevant Orders       Vitamin D, 25-hydroxy    Urinary incontinence        check UA, discussed Vesicare and SE, pt to discuss with family ,given 2 week samples    Relevant Medications       solifenacin (VESICARE) 5 MG tablet    Other Relevant Orders       Urinalysis, Routine w reflex microscopic    Need for prophylactic vaccination against Streptococcus pneumoniae (pneumococcus)  Relevant Orders       Pneumococcal conjugate vaccine 13-valent (Completed)       Note: This dictation was prepared with Dragon dictation along with smaller phrase technology. Any transcriptional errors that result from this process are unintentional.

## 2013-06-16 NOTE — Patient Instructions (Addendum)
You can try the Vesicare once a day for the urinary incontinence it does have side effects of dry mouth, constipation and dizziness  Other option is try the poise pads We will call with lab and urine results Prevnar 13  I will check to see when last Mammogram was Bone Density will be set up  F/U 3 months

## 2013-06-16 NOTE — Assessment & Plan Note (Signed)
Diet controlled, check A1C 

## 2013-06-16 NOTE — Telephone Encounter (Signed)
Refill appropriate and filled per protocol. 

## 2013-06-16 NOTE — Assessment & Plan Note (Signed)
Well controlled 

## 2013-06-16 NOTE — Assessment & Plan Note (Signed)
Continue compression hose, no diuretic needed at this time

## 2013-06-17 LAB — HEMOGLOBIN A1C
Hgb A1c MFr Bld: 6.3 % — ABNORMAL HIGH (ref <5.7)
Mean Plasma Glucose: 134 mg/dL — ABNORMAL HIGH (ref <117)

## 2013-06-17 LAB — LIPID PANEL
Cholesterol: 163 mg/dL (ref 0–200)
HDL: 56 mg/dL (ref >39)
LDL Cholesterol: 87 mg/dL (ref 0–99)
Total CHOL/HDL Ratio: 2.9 Ratio
Triglycerides: 100 mg/dL (ref <150)
VLDL: 20 mg/dL (ref 0–40)

## 2013-06-17 LAB — MICROALBUMIN / CREATININE URINE RATIO
Creatinine, Urine: 211.7 mg/dL
Microalb Creat Ratio: 11.6 mg/g (ref 0.0–30.0)
Microalb, Ur: 2.46 mg/dL — ABNORMAL HIGH (ref 0.00–1.89)

## 2013-06-17 LAB — COMPREHENSIVE METABOLIC PANEL
ALT: 24 U/L (ref 0–35)
AST: 27 U/L (ref 0–37)
Albumin: 4.2 g/dL (ref 3.5–5.2)
Alkaline Phosphatase: 58 U/L (ref 39–117)
BUN: 21 mg/dL (ref 6–23)
CO2: 25 mEq/L (ref 19–32)
Calcium: 9.6 mg/dL (ref 8.4–10.5)
Chloride: 106 mEq/L (ref 96–112)
Creat: 1.39 mg/dL — ABNORMAL HIGH (ref 0.50–1.10)
Glucose, Bld: 101 mg/dL — ABNORMAL HIGH (ref 70–99)
Potassium: 4.9 mEq/L (ref 3.5–5.3)
Sodium: 142 mEq/L (ref 135–145)
Total Bilirubin: 0.5 mg/dL (ref 0.2–1.2)
Total Protein: 6.9 g/dL (ref 6.0–8.3)

## 2013-06-17 LAB — VITAMIN D 25 HYDROXY (VIT D DEFICIENCY, FRACTURES): Vit D, 25-Hydroxy: 59 ng/mL (ref 30–89)

## 2013-06-18 ENCOUNTER — Encounter: Payer: Self-pay | Admitting: *Deleted

## 2013-06-18 LAB — URINALYSIS, MICROSCOPIC ONLY
Casts: NONE SEEN
Crystals: NONE SEEN
Squamous Epithelial / LPF: NONE SEEN

## 2013-06-18 LAB — URINALYSIS, ROUTINE W REFLEX MICROSCOPIC
Bilirubin Urine: NEGATIVE
Glucose, UA: NEGATIVE mg/dL
Hgb urine dipstick: NEGATIVE
Nitrite: NEGATIVE
Protein, ur: NEGATIVE mg/dL
Specific Gravity, Urine: 1.018 (ref 1.005–1.030)
Urobilinogen, UA: 0.2 mg/dL (ref 0.0–1.0)
pH: 6 (ref 5.0–8.0)

## 2013-06-20 ENCOUNTER — Other Ambulatory Visit: Payer: Self-pay | Admitting: *Deleted

## 2013-06-20 LAB — URINE CULTURE: Colony Count: >=100000

## 2013-06-20 MED ORDER — CEPHALEXIN 500 MG PO CAPS: 500.0000 mg | ORAL_CAPSULE | Freq: Four times a day (QID) | ORAL | Status: DC

## 2013-06-20 NOTE — Telephone Encounter (Signed)
Per orders noted on labs, medication sent to pharmacy.   Call placed to patient and patient made aware. 

## 2013-06-23 ENCOUNTER — Ambulatory Visit (HOSPITAL_COMMUNITY)
Admission: RE | Admit: 2013-06-23 | Discharge: 2013-06-23 | Disposition: A | Discharge status: Home / Self Care | Payer: Medicare Other | Source: Ambulatory Visit | Attending: Family Medicine | Admitting: Family Medicine

## 2013-06-23 ENCOUNTER — Other Ambulatory Visit: Payer: Self-pay | Admitting: Family Medicine

## 2013-06-23 DIAGNOSIS — Z1231 Encounter for screening mammogram for malignant neoplasm of breast: Secondary | ICD-10-CM | POA: Insufficient documentation

## 2013-07-29 ENCOUNTER — Other Ambulatory Visit: Payer: Self-pay | Admitting: Family Medicine

## 2013-08-13 ENCOUNTER — Other Ambulatory Visit: Payer: Self-pay | Admitting: Family Medicine

## 2013-08-14 NOTE — Telephone Encounter (Signed)
Ok to refill??  Last office visit/ refill 06/16/2013. 

## 2013-08-15 NOTE — Telephone Encounter (Signed)
Okay to refill give 3 

## 2013-08-15 NOTE — Telephone Encounter (Signed)
Medication called to pharmacy. 

## 2013-09-13 ENCOUNTER — Other Ambulatory Visit: Payer: Self-pay | Admitting: Family Medicine

## 2013-09-15 NOTE — Telephone Encounter (Signed)
Refill appropriate and filled per protocol. 

## 2013-09-16 ENCOUNTER — Ambulatory Visit: Payer: Medicare Other | Admitting: Family Medicine

## 2013-09-17 ENCOUNTER — Encounter (HOSPITAL_COMMUNITY): Payer: Self-pay | Admitting: Emergency Medicine

## 2013-09-17 ENCOUNTER — Emergency Department (HOSPITAL_COMMUNITY)
Admission: EM | Admit: 2013-09-17 | Discharge: 2013-09-17 | Disposition: A | Discharge status: Home / Self Care | Payer: Medicare Other | Source: Home / Self Care

## 2013-09-17 DIAGNOSIS — H1010 Acute atopic conjunctivitis, unspecified eye: Secondary | ICD-10-CM

## 2013-09-17 DIAGNOSIS — J309 Allergic rhinitis, unspecified: Secondary | ICD-10-CM

## 2013-09-17 DIAGNOSIS — H1013 Acute atopic conjunctivitis, bilateral: Secondary | ICD-10-CM

## 2013-09-17 NOTE — Discharge Instructions (Signed)
Thank you for coming in today. Take Zyrtec (cetirizine) daily for allergy Use your Rhinocort spray daily Use over-the-counter Zaditor eyedrops (Ketotifen) twice daily as needed for itching.  Call or go to the emergency room if you get worse, have trouble breathing, have chest pains, or palpitations.     Allergic Conjunctivitis The conjunctiva is a thin membrane that covers the visible white part of the eyeball and the underside of the eyelids. This membrane protects and lubricates the eye. The membrane has small blood vessels running through it that can normally be seen. When the conjunctiva becomes inflamed, the condition is called conjunctivitis. In response to the inflammation, the conjunctival blood vessels become swollen. The swelling results in redness in the normally white part of the eye. The blood vessels of this membrane also react when a person has allergies and is then called allergic conjunctivitis. This condition usually lasts for as long as the allergy persists. Allergic conjunctivitis cannot be passed to another person (non-contagious). The likelihood of bacterial infection is great and the cause is not likely due to allergies if the inflamed eye has:  A sticky discharge.  Discharge or sticking together of the lids in the morning.  Scaling or flaking of the eyelids where the eyelashes come out.  Red swollen eyelids. CAUSES   Viruses.  Irritants such as foreign bodies.  Chemicals.  General allergic reactions.  Inflammation or serious diseases in the inside or the outside of the eye or the orbit (the boney cavity in which the eye sits) can cause a "red eye." SYMPTOMS   Eye redness.  Tearing.  Itchy eyes.  Burning feeling in the eyes.  Clear drainage from the eye.  Allergic reaction due to pollens or ragweed sensitivity. Seasonal allergic conjunctivitis is frequent in the spring when pollens are in the air and in the fall. DIAGNOSIS  This condition, in its  many forms, is usually diagnosed based on the history and an ophthalmological exam. It usually involves both eyes. If your eyes react at the same time every year, allergies may be the cause. While most "red eyes" are due to allergy or an infection, the role of an eye (ophthalmological) exam is important. The exam can rule out serious diseases of the eye or orbit. TREATMENT   Non-antibiotic eye drops, ointments, or medications by mouth may be prescribed if the ophthalmologist is sure the conjunctivitis is due to allergies alone.  Over-the-counter drops and ointments for allergic symptoms should be used only after other causes of conjunctivitis have been ruled out, or as your caregiver suggests. Medications by mouth are often prescribed if other allergy-related symptoms are present. If the ophthalmologist is sure that the conjunctivitis is due to allergies alone, treatment is normally limited to drops or ointments to reduce itching and burning. HOME CARE INSTRUCTIONS   Wash hands before and after applying drops or ointments, or touching the inflamed eye(s) or eyelids.  Do not let the eye dropper tip or ointment tube touch the eyelid when putting medicine in your eye.  Stop using your soft contact lenses and throw them away. Use a new pair of lenses when recovery is complete. You should run through sterilizing cycles at least three times before use after complete recovery if the old soft contact lenses are to be used. Hard contact lenses should be stopped. They need to be thoroughly sterilized before use after recovery.  Itching and burning eyes due to allergies is often relieved by using a cool cloth applied to closed  eye(s). SEEK MEDICAL CARE IF:   Your problems do not go away after two or three days of treatment.  Your lids are sticky (especially in the morning when you wake up) or stick together.  Discharge develops. Antibiotics may be needed either as drops, ointment, or by mouth.  You have  extreme light sensitivity.  An oral temperature above 102 F (38.9 C) develops.  Pain in or around the eye or any other visual symptom develops. MAKE SURE YOU:   Understand these instructions.  Will watch your condition.  Will get help right away if you are not doing well or get worse. Document Released: 06/03/2002 Document Revised: 06/05/2011 Document Reviewed: 04/29/2007 [facility] Patient Information 2015 [place], [place]. This information is not intended to replace advice given to you by your health care provider. Make sure you discuss any questions you have with your health care provider.

## 2013-09-17 NOTE — ED Provider Notes (Signed)
[name] is a 80 y.o. female who presents to Urgent Care today for eye irritation and crusting. Symptoms present for one week and associated with cough congestion runny nose. Denies any pain or blurry vision. She has not tried any medications yet. No fevers chills nausea vomiting or diarrhea.   Past Medical History  Diagnosis Date  . Diabetes mellitus   . Hypertension   . Stroke   . GERD (gastroesophageal reflux disease)   . Hyperlipidemia   . UTI (lower urinary tract infection)   . CKD (chronic kidney disease)     Dr. Hinda Lenis  . Arthritis   . Anxiety   . Depression   . Osteoporosis    History  Substance Use Topics  . Smoking status: Never Smoker   . Smokeless tobacco: Not on file  . Alcohol Use: No   ROS as above Medications: Current Facility-Administered Medications  Medication Dose Route Frequency Provider Last Rate Last Dose  . methylPREDNISolone acetate (DEPO-MEDROL) injection 40 mg  40 mg Intramuscular Once Alycia Rossetti, MD       Current Outpatient Prescriptions  Medication Sig Dispense Refill  . AGGRENOX 25-200 MG per 12 hr capsule TAKE 1 CAPSULE BY MOUTH TWICE DAILY  120 capsule  3  . albuterol (PROVENTIL HFA;VENTOLIN HFA) 108 (90 BASE) MCG/ACT inhaler Inhale 2 puffs into the lungs every 6 (six) hours as needed (wheezing/cough).  1 Inhaler  0  . alendronate (FOSAMAX) 35 MG tablet TAKE 1 TABLET BY MOUTH EVERY WEEK AS DIRECTED  12 tablet  3  . amLODipine (NORVASC) 10 MG tablet TAKE 1 TABLET BY MOUTH DAILY  30 tablet  3  . aspirin 81 MG tablet Take 81 mg by mouth daily.      Marland Kitchen atorvastatin (LIPITOR) 20 MG tablet TAKE 1 TABLET BY MOUTH DAILY  30 tablet  0  . cephALEXin (KEFLEX) 500 MG capsule Take 1 capsule (500 mg total) by mouth 4 (four) times daily.  20 capsule  0  . Cranberry-Cholecalciferol 4200-500 MG-UNIT CAPS Take 1 tablet by mouth daily.      . fluticasone (FLONASE) 50 MCG/ACT nasal spray Place 2 sprays into the nose daily.  16 g  2  . labetalol  (NORMODYNE) 300 MG tablet TAKE 1 TABLET BY MOUTH TWICE DAILY  60 tablet  3  . LORazepam (ATIVAN) 1 MG tablet Take 1 tablet (1 mg total) by mouth 3 (three) times daily as needed.  90 tablet  1  . omeprazole (PRILOSEC) 20 MG capsule Take 1 capsule (20 mg total) by mouth daily.  30 capsule  6  . promethazine-codeine (PHENERGAN WITH CODEINE) 6.25-10 MG/5ML syrup Take 5 mLs by mouth every 4 (four) hours as needed for cough.  180 mL  0  . sodium bicarbonate 650 MG tablet TAKE 2 TABLETS BY MOUTH THREE TIMES DAILY  540 tablet  3  . solifenacin (VESICARE) 5 MG tablet Take 5 mg by mouth daily.      . traMADol (ULTRAM) 50 MG tablet TAKE 1 TABLET BY MOUTH TWICE DAILY AS NEEDED FOR PAIN  45 tablet  3  . Vitamin D, Cholecalciferol, 1000 UNITS TABS Take 1 tablet by mouth daily.        Exam:  BP 116/67  Pulse 64  Temp(Src) 98.6 F (37 C) (Oral)  Resp 18  SpO2 95% Gen: Well NAD HEENT: EOMI,  MMM bilateral mild conjunctival injection . PERRL. clear nasal discharge present  Lungs: Normal work of breathing. CTABL Heart: RRR no  MRG Abd: NABS, Soft. NT, ND Exts: Brisk capillary refill, warm and well perfused.   No results found for this or any previous visit (from the past 24 hour(s)). No results found.  Assessment and Plan: 80 y.o. female with allergic conjunctivitis most likely explanation. Plan to treat with antihistamine orally and eye drops. Additionally use steroid nasal spray  Discussed warning signs or symptoms. Please see discharge instructions. Patient expresses understanding.    Gregor Hams, MD 09/17/13 2033

## 2013-09-17 NOTE — ED Notes (Signed)
C/o crusting and irritation of eyes x 1 wk.  No relief with warm compresses.

## 2013-10-02 ENCOUNTER — Other Ambulatory Visit: Payer: Self-pay | Admitting: Family Medicine

## 2013-11-01 ENCOUNTER — Other Ambulatory Visit: Payer: Self-pay | Admitting: Family Medicine

## 2013-11-03 NOTE — Telephone Encounter (Signed)
Refill appropriate and filled per protocol. 

## 2013-11-13 ENCOUNTER — Telehealth: Payer: Self-pay | Admitting: Family Medicine

## 2013-11-13 ENCOUNTER — Other Ambulatory Visit: Payer: Self-pay | Admitting: Family Medicine

## 2013-11-13 MED ORDER — ASPIRIN-DIPYRIDAMOLE ER 25-200 MG PO CP12: ORAL_CAPSULE | ORAL | Status: DC

## 2013-11-13 NOTE — Telephone Encounter (Signed)
Medication filled x1 with no refills.   Requires office visit before any further refills can be given.   Letter sent.  

## 2013-11-13 NOTE — Telephone Encounter (Signed)
[phone]  Pharmacy [facility] [place]  Pt is needing a refill on AGGRENOX 25-200 MG per 12 hr capsule   The daughter is going out of town and would like to have it done today because she leaves tomorrow to go out of town.

## 2013-12-13 ENCOUNTER — Other Ambulatory Visit: Payer: Self-pay | Admitting: Family Medicine

## 2013-12-15 NOTE — Telephone Encounter (Signed)
Refill appropriate and filled per protocol. 

## 2013-12-22 ENCOUNTER — Ambulatory Visit: Payer: Medicare Other | Admitting: Family Medicine

## 2013-12-24 ENCOUNTER — Ambulatory Visit: Payer: Medicare Other | Admitting: Family Medicine

## 2013-12-30 ENCOUNTER — Telehealth: Payer: Self-pay | Admitting: Family Medicine

## 2013-12-30 NOTE — Telephone Encounter (Signed)
Pt will call back to schedule GREENFOLDER CPE

## 2014-01-09 ENCOUNTER — Ambulatory Visit (INDEPENDENT_AMBULATORY_CARE_PROVIDER_SITE_OTHER): Payer: Medicare Other | Admitting: Family Medicine

## 2014-01-09 ENCOUNTER — Encounter: Payer: Self-pay | Admitting: Family Medicine

## 2014-01-09 VITALS — BP 128/68 | HR 64 | Temp 98.5°F | Resp 12 | Ht 59.0 in | Wt 159.0 lb

## 2014-01-09 DIAGNOSIS — N183 Chronic kidney disease, stage 3 unspecified: Secondary | ICD-10-CM

## 2014-01-09 DIAGNOSIS — I10 Essential (primary) hypertension: Secondary | ICD-10-CM

## 2014-01-09 DIAGNOSIS — E119 Type 2 diabetes mellitus without complications: Secondary | ICD-10-CM

## 2014-01-09 DIAGNOSIS — R609 Edema, unspecified: Secondary | ICD-10-CM

## 2014-01-09 DIAGNOSIS — Z23 Encounter for immunization: Secondary | ICD-10-CM

## 2014-01-09 DIAGNOSIS — M170 Bilateral primary osteoarthritis of knee: Secondary | ICD-10-CM

## 2014-01-09 LAB — CBC WITH DIFFERENTIAL/PLATELET
Basophils Absolute: 0 10*3/uL (ref 0.0–0.1)
Basophils Relative: 0 % (ref 0–1)
Eosinophils Absolute: 0.2 10*3/uL (ref 0.0–0.7)
Eosinophils Relative: 3 % (ref 0–5)
HCT: 37.7 % (ref 36.0–46.0)
Hemoglobin: 12.7 g/dL (ref 12.0–15.0)
Lymphocytes Relative: 35 % (ref 12–46)
Lymphs Abs: 2.2 10*3/uL (ref 0.7–4.0)
MCH: 31.1 pg (ref 26.0–34.0)
MCHC: 33.7 g/dL (ref 30.0–36.0)
MCV: 92.2 fL (ref 78.0–100.0)
Monocytes Absolute: 0.6 10*3/uL (ref 0.1–1.0)
Monocytes Relative: 9 % (ref 3–12)
Neutro Abs: 3.3 10*3/uL (ref 1.7–7.7)
Neutrophils Relative %: 53 % (ref 43–77)
Platelets: 229 10*3/uL (ref 150–400)
RBC: 4.09 MIL/uL (ref 3.87–5.11)
RDW: 14.1 % (ref 11.5–15.5)
WBC: 6.3 10*3/uL (ref 4.0–10.5)

## 2014-01-09 LAB — LIPID PANEL
Cholesterol: 163 mg/dL (ref 0–200)
HDL: 55 mg/dL (ref >39)
LDL Cholesterol: 79 mg/dL (ref 0–99)
Total CHOL/HDL Ratio: 3 Ratio
Triglycerides: 144 mg/dL (ref <150)
VLDL: 29 mg/dL (ref 0–40)

## 2014-01-09 LAB — COMPREHENSIVE METABOLIC PANEL
ALT: 23 U/L (ref 0–35)
AST: 23 U/L (ref 0–37)
Albumin: 4 g/dL (ref 3.5–5.2)
Alkaline Phosphatase: 63 U/L (ref 39–117)
BUN: 26 mg/dL — ABNORMAL HIGH (ref 6–23)
CO2: 28 mEq/L (ref 19–32)
Calcium: 9.5 mg/dL (ref 8.4–10.5)
Chloride: 106 mEq/L (ref 96–112)
Creat: 1.72 mg/dL — ABNORMAL HIGH (ref 0.50–1.10)
Glucose, Bld: 91 mg/dL (ref 70–99)
Potassium: 4.4 mEq/L (ref 3.5–5.3)
Sodium: 142 mEq/L (ref 135–145)
Total Bilirubin: 0.5 mg/dL (ref 0.2–1.2)
Total Protein: 6.7 g/dL (ref 6.0–8.3)

## 2014-01-09 LAB — MICROALBUMIN / CREATININE URINE RATIO
Creatinine, Urine: 418 mg/dL
Microalb Creat Ratio: 5.5 mg/g (ref 0.0–30.0)
Microalb, Ur: 2.3 mg/dL — ABNORMAL HIGH (ref <2.0)

## 2014-01-09 LAB — HEMOGLOBIN A1C
Hgb A1c MFr Bld: 6.1 % — ABNORMAL HIGH (ref <5.7)
Mean Plasma Glucose: 128 mg/dL — ABNORMAL HIGH (ref <117)

## 2014-01-09 MED ORDER — LORAZEPAM 1 MG PO TABS: 1.0000 mg | ORAL_TABLET | Freq: Every day | ORAL | Status: DC

## 2014-01-09 NOTE — Patient Instructions (Signed)
Use ativan at bedtime for sleep and nerves Continue all other medications Flu shot given  F/U 4 WEEKS- RIght knee , otherwise f/u 3 months

## 2014-01-11 ENCOUNTER — Encounter: Payer: Self-pay | Admitting: Family Medicine

## 2014-01-11 NOTE — Assessment & Plan Note (Signed)
Discussed wearing compression hose, pt has at home

## 2014-01-11 NOTE — Progress Notes (Signed)
Patient ID: [name], female   DOB: [birth year], 80 y.o.   MRN: [id number]   Subjective:    Patient ID: [name], female    DOB: 1931/03/03, 80 y.o.   MRN: [id number]  Patient presents for Medication Review/ Refill, Hard Movable Knot to L Inner Wrist and Bruising to L Knee   Pt here with left knee pain, would like steroid injection known OA of knee, also taking ultram which helps. Had swelling of knee over the weekend was up on it a lot and wearing heels for a special occasion.  Feels a knot on her left wrist has been for a few month, no particular injury no pain  DM- diet controlled on statin drug and ASA  Daughter [name] here with her today  Review Of Systems:  GEN- denies fatigue, fever, weight loss,weakness, recent illness HEENT- denies eye drainage, change in vision, nasal discharge, CVS- denies chest pain, palpitations RESP- denies SOB, cough, wheeze ABD- denies N/V, change in stools, abd pain GU- denies dysuria, hematuria, dribbling, incontinence MSK-+joint pain, muscle aches, injury Neuro- denies headache, dizziness, syncope, seizure activity       Objective:    BP 128/68  Pulse 64  Temp(Src) 98.5 F (36.9 C) (Oral)  Resp 12  Ht 4\' 11"  (1.499 m)  Wt 159 lb (72.122 kg)  BMI 32.10 kg/m2 GEN- NAD, alert and oriented x3 HEENT- PERRL, EOMI, non injected sclera, pink conjunctiva, MMM, oropharynx clear Neck- Supple, no thyromegaly CVS- RRR, no murmur RESP-CTAB MSK- Bilat knee- fair ROM, +crepitus, minimal effusion of Left knee, no warmth, hyperpigementation from inferior knee to extending 2-3 cm down (chronic) EXT- pedal edema Pulses- Radial, DP- 2+  Procedure- Left knee injection Procedure explained to patient questions answered benefits and risks discussed verbal consent obtained. Antiseptic-Betadine Injection- Kenalog 40mg , lidocain 1% 51ml, marcaine 1% 84ml Minimal blood loss Patient tolerated procedure well Bandage applied        Assessment &  Plan:      Problem List Items Addressed This Visit   OA (osteoarthritis) of knee   Essential hypertension   Diabetes mellitus, type II   Relevant Orders      CBC with Differential (Completed)      Comprehensive metabolic panel (Completed)      Hemoglobin A1c (Completed)      Lipid panel (Completed)      Microalbumin / creatinine urine ratio (Completed)    Other Visit Diagnoses   Need for prophylactic vaccination and inoculation against influenza    -  Primary    Relevant Orders       Flu Vaccine QUAD 36+ mos PF IM (Fluarix Quad PF) (Completed)       Note: This dictation was prepared with Dragon dictation along with smaller phrase technology. Any transcriptional errors that result from this process are unintentional.

## 2014-01-11 NOTE — Assessment & Plan Note (Signed)
S/p steroid injection Continue ultram ICE knee

## 2014-01-11 NOTE — Assessment & Plan Note (Signed)
Well controlled 

## 2014-01-11 NOTE — Assessment & Plan Note (Signed)
Diet controlled, recheck A1C  

## 2014-01-11 NOTE — Assessment & Plan Note (Addendum)
Followed with neprhology, baseline 1.4

## 2014-01-12 ENCOUNTER — Telehealth: Payer: Self-pay | Admitting: *Deleted

## 2014-01-12 ENCOUNTER — Telehealth: Payer: Self-pay | Admitting: Family Medicine

## 2014-01-12 MED ORDER — OMEPRAZOLE 20 MG PO CPDR: DELAYED_RELEASE_CAPSULE | ORAL | Status: DC

## 2014-01-12 MED ORDER — ASPIRIN-DIPYRIDAMOLE ER 25-200 MG PO CP12: ORAL_CAPSULE | ORAL | Status: DC

## 2014-01-12 MED ORDER — ALENDRONATE SODIUM 35 MG PO TABS: ORAL_TABLET | ORAL | Status: DC

## 2014-01-12 MED ORDER — AMLODIPINE BESYLATE 10 MG PO TABS: ORAL_TABLET | ORAL | Status: DC

## 2014-01-12 MED ORDER — ATORVASTATIN CALCIUM 20 MG PO TABS: ORAL_TABLET | ORAL | Status: DC

## 2014-01-12 MED ORDER — LABETALOL HCL 300 MG PO TABS: ORAL_TABLET | ORAL | Status: DC

## 2014-01-12 MED ORDER — SODIUM BICARBONATE 650 MG PO TABS: ORAL_TABLET | ORAL | Status: DC

## 2014-01-12 NOTE — Telephone Encounter (Signed)
Call placed to patient daughter and she was made aware of lab results.

## 2014-01-12 NOTE — Telephone Encounter (Signed)
Calling with questions about labs on Friday  [phone]

## 2014-01-12 NOTE — Telephone Encounter (Signed)
Per MD, medications sent in with 90 day supply.

## 2014-02-09 ENCOUNTER — Ambulatory Visit: Payer: Medicare Other | Admitting: Family Medicine

## 2014-03-08 ENCOUNTER — Other Ambulatory Visit: Payer: Self-pay | Admitting: Family Medicine

## 2014-03-09 NOTE — Telephone Encounter (Signed)
Okay to refill? 

## 2014-03-09 NOTE — Telephone Encounter (Signed)
Ok to refill??  Last office visit 01/06/2014.  Last refill 08/15/2013, #3 refills.

## 2014-03-09 NOTE — Telephone Encounter (Signed)
Medication called to pharmacy. 

## 2014-03-25 ENCOUNTER — Encounter: Payer: Self-pay | Admitting: Family Medicine

## 2014-03-25 ENCOUNTER — Ambulatory Visit (INDEPENDENT_AMBULATORY_CARE_PROVIDER_SITE_OTHER): Payer: Medicare Other | Admitting: Family Medicine

## 2014-03-25 VITALS — BP 136/74 | HR 72 | Temp 98.5°F | Resp 16 | Ht 59.0 in | Wt 162.0 lb

## 2014-03-25 DIAGNOSIS — M1990 Unspecified osteoarthritis, unspecified site: Secondary | ICD-10-CM | POA: Insufficient documentation

## 2014-03-25 DIAGNOSIS — M19011 Primary osteoarthritis, right shoulder: Secondary | ICD-10-CM

## 2014-03-25 DIAGNOSIS — K121 Other forms of stomatitis: Secondary | ICD-10-CM | POA: Insufficient documentation

## 2014-03-25 DIAGNOSIS — M19012 Primary osteoarthritis, left shoulder: Secondary | ICD-10-CM

## 2014-03-25 DIAGNOSIS — I10 Essential (primary) hypertension: Secondary | ICD-10-CM

## 2014-03-25 DIAGNOSIS — M170 Bilateral primary osteoarthritis of knee: Secondary | ICD-10-CM

## 2014-03-25 MED ORDER — TRIAMCINOLONE ACETONIDE 0.1 % EX OINT: TOPICAL_OINTMENT | CUTANEOUS | Status: DC

## 2014-03-25 MED ORDER — TRAMADOL HCL 50 MG PO TABS: ORAL_TABLET | ORAL | Status: DC

## 2014-03-25 NOTE — Patient Instructions (Signed)
Ice your knee Elevate your feet Use the cream twice a day on your gums Ultram for arthritis for knee and shoulder twice a day  F/U 3 months

## 2014-03-25 NOTE — Assessment & Plan Note (Signed)
bilat shoulders, seen ortho in past, ultram BID, fairly good ROM for age

## 2014-03-25 NOTE — Assessment & Plan Note (Signed)
2/2 upper plates, kenalog to area BID, remove upper plate to let heal

## 2014-03-25 NOTE — Assessment & Plan Note (Signed)
Well controlled 

## 2014-03-25 NOTE — Assessment & Plan Note (Signed)
S/p right knee injection Ultram BID for arthritis pain Declines ortho for shoulder at this time

## 2014-03-25 NOTE — Progress Notes (Signed)
Patient ID: [name], female   DOB: 09-Nov-1930, 80 y.o.   MRN: [id number]   Subjective:    Patient ID: [name], female    DOB: 1931/01/25, 80 y.o.   MRN: [id number]  Patient presents for BLE Edema; B Shoulder Pain; and Mouth Irritation  patient here for follow-up. She complains of bilateral knee pain right worse than left I gave her an injection in her left knee last time and she is actually done quite well without when she takes tramadol as needed which also helps her joint in her shoulders. She would like a knee injection in the right 1 today. Her shoulder pain goes between both of them she is seen orthopedics in the past she also has arthritis in her shoulders she does not want a shoulder injection and this as the tramadol does help.  She has had a sore spot along her gums and she felt like the skin was irritated on the right side she does wear a plate up top and that is where most of the soreness is.  Foot swelling she's had some mild feet swelling which is her baseline when she elevates them they do go down she is also wearing a compression sock    Review Of Systems:  GEN- denies fatigue, fever, weight loss,weakness, recent illness HEENT- denies eye drainage, change in vision, nasal discharge, CVS- denies chest pain, palpitations RESP- denies SOB, cough, wheeze ABD- denies N/V, change in stools, abd pain GU- denies dysuria, hematuria, dribbling, incontinence MSK-+ joint pain, muscle aches, injury Neuro- denies headache, dizziness, syncope, seizure activity       Objective:    BP 136/74 mmHg  Pulse 72  Temp(Src) 98.5 F (36.9 C) (Oral)  Resp 16  Ht 4\' 11"  (1.499 m)  Wt 162 lb (73.483 kg)  BMI 32.70 kg/m2 GEN- NAD, alert and oriented x3 HEENT- PERRL, EOMI, non injected sclera, pink conjunctiva, MMM, oropharynx clear, upper frontal gumline erythema with small ulceration, no lesions on buccal mucosa CVS- RRR, no murmur RESP-CTAB MSK- bilat knees- mild  swelling, decreased ROM, + crepitus, no warmth ,no erythema Bilat shoulder normal appearance.FAIR rom, impingment , rotator cuff in tact EXT- No edema Pulses- Radial 2+   Procedure- RIGHT Knee injection Procedure explained to patient questions answered benefits and risks discussed verbal consent obtained. Antiseptic-Betadine Injection- Kenalog 40mg  1cc, Lidocaine 1% 2cc, Marcaine 0.5% 2cc Minimal blood loss Patient tolerated procedure well Bandage applied       Assessment & Plan:      Problem List Items Addressed This Visit    None      Note: This dictation was prepared with Dragon dictation along with smaller phrase technology. Any transcriptional errors that result from this process are unintentional.

## 2014-06-30 ENCOUNTER — Ambulatory Visit: Payer: Medicare Other | Admitting: Family Medicine

## 2014-07-22 ENCOUNTER — Other Ambulatory Visit: Payer: Self-pay | Admitting: Family Medicine

## 2014-07-23 ENCOUNTER — Ambulatory Visit (INDEPENDENT_AMBULATORY_CARE_PROVIDER_SITE_OTHER): Payer: Medicare Other | Admitting: Family Medicine

## 2014-07-23 ENCOUNTER — Encounter: Payer: Self-pay | Admitting: Family Medicine

## 2014-07-23 VITALS — BP 128/80 | HR 78 | Temp 98.3°F | Resp 14 | Ht 59.0 in | Wt 160.0 lb

## 2014-07-23 DIAGNOSIS — M170 Bilateral primary osteoarthritis of knee: Secondary | ICD-10-CM

## 2014-07-23 DIAGNOSIS — I10 Essential (primary) hypertension: Secondary | ICD-10-CM | POA: Diagnosis not present

## 2014-07-23 DIAGNOSIS — E119 Type 2 diabetes mellitus without complications: Secondary | ICD-10-CM

## 2014-07-23 DIAGNOSIS — N183 Chronic kidney disease, stage 3 unspecified: Secondary | ICD-10-CM

## 2014-07-23 DIAGNOSIS — L609 Nail disorder, unspecified: Secondary | ICD-10-CM | POA: Diagnosis not present

## 2014-07-23 LAB — CBC WITH DIFFERENTIAL/PLATELET
Basophils Absolute: 0.1 10*3/uL (ref 0.0–0.1)
Basophils Relative: 1 % (ref 0–1)
Eosinophils Absolute: 0.2 10*3/uL (ref 0.0–0.7)
Eosinophils Relative: 4 % (ref 0–5)
HCT: 39.8 % (ref 36.0–46.0)
Hemoglobin: 12.8 g/dL (ref 12.0–15.0)
Lymphocytes Relative: 43 % (ref 12–46)
Lymphs Abs: 2.4 10*3/uL (ref 0.7–4.0)
MCH: 30.8 pg (ref 26.0–34.0)
MCHC: 32.2 g/dL (ref 30.0–36.0)
MCV: 95.7 fL (ref 78.0–100.0)
MPV: 10.6 fL (ref 8.6–12.4)
Monocytes Absolute: 0.6 10*3/uL (ref 0.1–1.0)
Monocytes Relative: 10 % (ref 3–12)
Neutro Abs: 2.4 10*3/uL (ref 1.7–7.7)
Neutrophils Relative %: 42 % — ABNORMAL LOW (ref 43–77)
Platelets: 236 10*3/uL (ref 150–400)
RBC: 4.16 MIL/uL (ref 3.87–5.11)
RDW: 14.3 % (ref 11.5–15.5)
WBC: 5.6 10*3/uL (ref 4.0–10.5)

## 2014-07-23 LAB — LIPID PANEL
Cholesterol: 152 mg/dL (ref 0–200)
HDL: 55 mg/dL (ref >=46)
LDL Cholesterol: 70 mg/dL (ref 0–99)
Total CHOL/HDL Ratio: 2.8 Ratio
Triglycerides: 135 mg/dL (ref <150)
VLDL: 27 mg/dL (ref 0–40)

## 2014-07-23 LAB — COMPREHENSIVE METABOLIC PANEL
ALT: 18 U/L (ref 0–35)
AST: 20 U/L (ref 0–37)
Albumin: 3.8 g/dL (ref 3.5–5.2)
Alkaline Phosphatase: 58 U/L (ref 39–117)
BUN: 25 mg/dL — ABNORMAL HIGH (ref 6–23)
CO2: 28 mEq/L (ref 19–32)
Calcium: 9.5 mg/dL (ref 8.4–10.5)
Chloride: 108 mEq/L (ref 96–112)
Creat: 1.52 mg/dL — ABNORMAL HIGH (ref 0.50–1.10)
Glucose, Bld: 93 mg/dL (ref 70–99)
Potassium: 4.3 mEq/L (ref 3.5–5.3)
Sodium: 145 mEq/L (ref 135–145)
Total Bilirubin: 0.4 mg/dL (ref 0.2–1.2)
Total Protein: 6.9 g/dL (ref 6.0–8.3)

## 2014-07-23 LAB — HEMOGLOBIN A1C
Hgb A1c MFr Bld: 6.3 % — ABNORMAL HIGH (ref <5.7)
Mean Plasma Glucose: 134 mg/dL — ABNORMAL HIGH (ref <117)

## 2014-07-23 MED ORDER — ZOSTER VACCINE LIVE 19400 UNT/0.65ML ~~LOC~~ SOLR: 0.6500 mL | Freq: Once | SUBCUTANEOUS | Status: DC

## 2014-07-23 NOTE — Assessment & Plan Note (Signed)
Well controlled,  

## 2014-07-23 NOTE — Assessment & Plan Note (Signed)
Diet controlled Pt to schedule eye exam Podiatry appt to be scheduled

## 2014-07-23 NOTE — Telephone Encounter (Signed)
Per MD, refill approved.   Medication called to pharmacy.

## 2014-07-23 NOTE — Progress Notes (Signed)
Patient ID: [name], female   DOB: 1930/05/31, 81 y.o.   MRN: [id number] Subjective:   Patient presents for Medicare Annual/Subsequent preventive examination.  Patient here for annual wellness exam. Her only complaint is left knee pain she would like to have an injection done. She has known osteoarthritis of the knees. I injected the right knee 4 months ago she did well with this. She does continue to use tramadol as needed for pain.  Review Past Medical/Family/Social: Per EMR    Risk Factors  Current exercise habits: None Dietary issues discussed: Eats small meals  Cardiac risk factors: Obesity (BMI >= 30 kg/m2). HTN, DM  Depression Screen  (Note: if answer to either of the following is "Yes", a more complete depression screening is indicated)  Over the past two weeks, have you felt down, depressed or hopeless? No Over the past two weeks, have you felt little interest or pleasure in doing things? No Have you lost interest or pleasure in daily life? No Do you often feel hopeless? No Do you cry easily over simple problems? No   Activities of Daily Living  In your present state of health, do you have any difficulty performing the following activities?:  Driving? Yes Managing money? No  Feeding yourself? No  Getting from bed to chair? No  Climbing a flight of stairs? Yes  Preparing food and eating?: No  Bathing or showering? No  Getting dressed: No  Getting to the toilet? No  Using the toilet:No  Moving around from place to place: Yes  In the past year have you fallen or had a near fall?:No  Are you sexually active? No  Do you have more than one partner? No   Hearing Difficulties: No  Do you often ask people to speak up or repeat themselves? No  Do you experience ringing or noises in your ears? No Do you have difficulty understanding soft or whispered voices? No  Do you feel that you have a problem with memory? No Do you often misplace items? No  Do you feel safe at  home? Yes  Cognitive Testing  Alert? Yes Normal Appearance?Yes  Oriented to person? Yes Place? Yes  Time? Yes  Recall of three objects? Yes  Can perform simple calculations? Yes  Displays appropriate judgment?Yes  Can read the correct time from a watch face?Yes   List the Names of Other Physician/Practitioners you currently use:   - Neprhology   Screening Tests / Date UTD per EMR Colonoscopy                     Zostavax - Discussed, send to pharmacy  GEN- denies fatigue, fever, weight loss,weakness, recent illness HEENT- denies eye drainage, change in vision, nasal discharge, CVS- denies chest pain, palpitations RESP- denies SOB, cough, wheeze ABD- denies N/V, change in stools, abd pain GU- denies dysuria, hematuria, dribbling, incontinence MSK- denies joint pain, muscle aches, injury Neuro- denies headache, dizziness, syncope, seizure activity  PHYSICAL: GEN- NAD, alert and oriented x3 HEENT- PERRL, EOMI, non injected sclera, pink conjunctiva, MMM, oropharynx clear,  CVS- RRR, no murmur RESP-CTAB MSK- bilat knees- mild swelling, decreased ROM, + crepitus, no warmth ,no erythema EXT- No edema Pulses- Radial 2+   Procedure- Left Knee injection Procedure explained to patient questions answered benefits and risks discussed verbal consent obtained. Antiseptic-Betadine Injection- Kenalog 40mg  1cc, Lidocaine 1% 2cc, Marcaine 0.5% 2cc Minimal blood loss Patient tolerated procedure well Bandage applied     Assessment:  Annual wellness medicare exam   Plan:    During the course of the visit the patient was educated and counseled about appropriate screening and preventive services including:    Shingles vaccine. Prescription given to that she can get the vaccine at the pharmacy or Medicare part D.    Diet review for nutrition referral? Yes ____ Not Indicated __x__  Patient Instructions (the written plan) was given to the patient.  Medicare Attestation  I have  personally reviewed:  The patient's medical and social history  Their use of alcohol, tobacco or illicit drugs  Their current medications and supplements  The patient's functional ability including ADLs,fall risks, home safety risks, cognitive, and hearing and visual impairment  Diet and physical activities  Evidence for depression or mood disorders  The patient's weight, height, BMI, and visual acuity have been recorded in the chart. I have made referrals, counseling, and provided education to the patient based on review of the above and I have provided the patient with a written personalized care plan for preventive services.

## 2014-07-23 NOTE — Patient Instructions (Addendum)
We will call with lab result Continue current medications Drink Glucerna or ensure for appetite  Podiatry referral Shingles shot sent to pharmacy Schedule eye appointment F/U 4 months

## 2014-07-23 NOTE — Assessment & Plan Note (Signed)
S/p cortisone injection, ice elevate tonight Ultram for pain

## 2014-08-20 ENCOUNTER — Other Ambulatory Visit: Payer: Self-pay | Admitting: Family Medicine

## 2014-08-21 NOTE — Telephone Encounter (Signed)
Medication refill per protocol °

## 2014-09-04 ENCOUNTER — Encounter: Payer: Self-pay | Admitting: *Deleted

## 2014-09-10 ENCOUNTER — Other Ambulatory Visit: Payer: Self-pay | Admitting: Family Medicine

## 2014-09-14 NOTE — Telephone Encounter (Signed)
Medication refilled per protocol. 

## 2014-10-14 ENCOUNTER — Ambulatory Visit (INDEPENDENT_AMBULATORY_CARE_PROVIDER_SITE_OTHER): Payer: Medicare Other | Admitting: Family Medicine

## 2014-10-14 ENCOUNTER — Encounter: Payer: Self-pay | Admitting: Family Medicine

## 2014-10-14 VITALS — BP 132/68 | HR 70 | Temp 98.7°F | Resp 14 | Ht 59.0 in | Wt 158.0 lb

## 2014-10-14 DIAGNOSIS — M170 Bilateral primary osteoarthritis of knee: Secondary | ICD-10-CM

## 2014-10-14 DIAGNOSIS — K136 Irritative hyperplasia of oral mucosa: Secondary | ICD-10-CM

## 2014-10-14 DIAGNOSIS — K1379 Other lesions of oral mucosa: Secondary | ICD-10-CM

## 2014-10-14 MED ORDER — DICLOFENAC SODIUM 1 % TD GEL: TRANSDERMAL | Status: DC

## 2014-10-14 NOTE — Assessment & Plan Note (Signed)
I'll refer to orthopedics for further intervention on her knees. We have injected the knees however this is no longer helping very much. I'll have her increase the tramadol to 1 tablet twice a day we will also send over full tear and gel and she cannot use any anti-inflammatory by mouth due to her chronic kidney disease. She has knee braces at home

## 2014-10-14 NOTE — Progress Notes (Signed)
Patient ID: [name], female   DOB: 04/05/1930, 81 y.o.   MRN: [id number]   Subjective:    Patient ID: [name], female    DOB: 1931/02/08, 81 y.o.   MRN: [id number]  Patient presents for BLE Pain  ratio with bilateral knee pain she has known osteoporosis of the knee she is now ready to be referred to orthopedics. Her last knee injection was done in the left knee about 4 months ago it lasted for about 5-6 weeks. She uses Ultram which helped some and only last a few hours. She denies any locking of the knee or giving out. They both swell on and off. She uses knee braces at home.   She's also noticed that the lining of her mouth is sloughing off beneath her tongue it is not painful but more annoying it comes off in small pieces. She's been using 2 different mouthwashes she also has dentures but has not changed the adhesive for these. She denies any sore throat or any ulcerations in the mouth    Review Of Systems:  GEN- denies fatigue, fever, weight loss,weakness, recent illness HEENT- denies eye drainage, change in vision, nasal discharge, CVS- denies chest pain, palpitations RESP- denies SOB, cough, wheeze ABD- denies N/V, change in stools, abd pain GU- denies dysuria, hematuria, dribbling, incontinence MSK- + joint pain, muscle aches, injury Neuro- denies headache, dizziness, syncope, seizure activity       Objective:    BP 132/68 mmHg  Pulse 70  Temp(Src) 98.7 F (37.1 C) (Oral)  Resp 14  Ht 4\' 11"  (1.499 m)  Wt 158 lb (71.668 kg)  BMI 31.89 kg/m2 PHYSICAL: GEN- NAD, alert and oriented x3 HEENT- PERRL, EOMI, non injected sclera, pink conjunctiva, MMM, oropharynx clear,  CVS- RRR, no murmur RESP-CTAB MSK- bilat knees- mild swelling L >R  decreased ROM, + crepitus, no warmth ,no erythema EXT- No edema Pulses- Radial 2+        Assessment & Plan:      Problem List Items Addressed This Visit    OA (osteoarthritis) of knee - Primary     I'll refer to  orthopedics for further intervention on her knees. We have injected the knees however this is no longer helping very much. I'll have her increase the tramadol to 1 tablet twice a day we will also send over full tear and gel and she cannot use any anti-inflammatory by mouth due to her chronic kidney disease. She has knee braces at home      Relevant Orders   Ambulatory referral to Orthopedic Surgery   Irritation of oral cavity     Mild epithelial lysis beneath the tongue but no sign of any ulcerations no pain no abnormal erythema. This could be coming from her dentures with overuse of the mouthwash. When I have her stop using a mouthwash and just use warm water rinses.         Note: This dictation was prepared with Dragon dictation along with smaller phrase technology. Any transcriptional errors that result from this process are unintentional.

## 2014-10-14 NOTE — Assessment & Plan Note (Signed)
Mild epithelial lysis beneath the tongue but no sign of any ulcerations no pain no abnormal erythema. This could be coming from her dentures with overuse of the mouthwash. When I have her stop using a mouthwash and just use warm water rinses.

## 2014-10-14 NOTE — Patient Instructions (Addendum)
Referral to orthopedics for arthritis of knees ( Dr. Aline Brochure)  Use ultram 1 tablet in morning and 1 before bedtime Use the voltaren gel as needed during the day  Stop the mouth wash and use warm water rinses instead  Continue current medications Change F/U to Sept

## 2014-10-19 ENCOUNTER — Ambulatory Visit: Payer: Self-pay | Admitting: Family Medicine

## 2014-10-28 ENCOUNTER — Other Ambulatory Visit: Payer: Self-pay | Admitting: Family Medicine

## 2014-10-29 NOTE — Telephone Encounter (Signed)
Medication refilled per protocol. 

## 2014-10-29 NOTE — Telephone Encounter (Signed)
Ok to refill tramadol??  Last office visit 10/14/2014.  Last refill 03/25/2014, #3 refills.

## 2014-10-29 NOTE — Telephone Encounter (Signed)
Okay give 3  

## 2014-11-16 ENCOUNTER — Ambulatory Visit: Payer: Self-pay | Admitting: Orthopedic Surgery

## 2014-11-23 ENCOUNTER — Ambulatory Visit: Payer: Medicare Other | Admitting: Family Medicine

## 2014-12-20 ENCOUNTER — Other Ambulatory Visit: Payer: Self-pay | Admitting: Family Medicine

## 2014-12-21 NOTE — Telephone Encounter (Signed)
Medication refilled per protocol. 

## 2014-12-21 NOTE — Telephone Encounter (Signed)
Okay to refill? 

## 2014-12-21 NOTE — Telephone Encounter (Signed)
Ok to refill??  Last office visit 10/14/2014.  Last refill 07/23/2014, #3 refills.

## 2014-12-22 ENCOUNTER — Encounter: Payer: Self-pay | Admitting: Family Medicine

## 2014-12-22 ENCOUNTER — Ambulatory Visit (INDEPENDENT_AMBULATORY_CARE_PROVIDER_SITE_OTHER): Payer: Medicare Other | Admitting: Family Medicine

## 2014-12-22 VITALS — BP 130/78 | HR 68 | Temp 98.7°F | Resp 14 | Ht 59.0 in | Wt 160.0 lb

## 2014-12-22 DIAGNOSIS — I10 Essential (primary) hypertension: Secondary | ICD-10-CM | POA: Diagnosis not present

## 2014-12-22 DIAGNOSIS — Z23 Encounter for immunization: Secondary | ICD-10-CM

## 2014-12-22 DIAGNOSIS — E213 Hyperparathyroidism, unspecified: Secondary | ICD-10-CM | POA: Diagnosis not present

## 2014-12-22 DIAGNOSIS — N183 Chronic kidney disease, stage 3 unspecified: Secondary | ICD-10-CM

## 2014-12-22 DIAGNOSIS — F418 Other specified anxiety disorders: Secondary | ICD-10-CM

## 2014-12-22 DIAGNOSIS — F419 Anxiety disorder, unspecified: Secondary | ICD-10-CM

## 2014-12-22 DIAGNOSIS — E119 Type 2 diabetes mellitus without complications: Secondary | ICD-10-CM | POA: Diagnosis not present

## 2014-12-22 DIAGNOSIS — F32A Depression, unspecified: Secondary | ICD-10-CM

## 2014-12-22 DIAGNOSIS — F329 Major depressive disorder, single episode, unspecified: Secondary | ICD-10-CM

## 2014-12-22 MED ORDER — AMLODIPINE BESYLATE 10 MG PO TABS: ORAL_TABLET | ORAL | Status: DC

## 2014-12-22 MED ORDER — TRAMADOL HCL 50 MG PO TABS: 50.0000 mg | ORAL_TABLET | Freq: Two times a day (BID) | ORAL | Status: DC | PRN

## 2014-12-22 MED ORDER — ASPIRIN-DIPYRIDAMOLE ER 25-200 MG PO CP12: 1.0000 | ORAL_CAPSULE | Freq: Two times a day (BID) | ORAL | Status: DC

## 2014-12-22 MED ORDER — SODIUM BICARBONATE 650 MG PO TABS: ORAL_TABLET | ORAL | Status: DC

## 2014-12-22 MED ORDER — ATORVASTATIN CALCIUM 20 MG PO TABS: ORAL_TABLET | ORAL | Status: DC

## 2014-12-22 MED ORDER — LORAZEPAM 1 MG PO TABS: 1.0000 mg | ORAL_TABLET | Freq: Every day | ORAL | Status: DC

## 2014-12-22 MED ORDER — ALENDRONATE SODIUM 35 MG PO TABS: ORAL_TABLET | ORAL | Status: DC

## 2014-12-22 MED ORDER — LABETALOL HCL 300 MG PO TABS: 300.0000 mg | ORAL_TABLET | Freq: Two times a day (BID) | ORAL | Status: DC

## 2014-12-22 NOTE — Assessment & Plan Note (Signed)
She continues to use Ativan as needed

## 2014-12-22 NOTE — Patient Instructions (Signed)
Dr. Ricki Miller-- [phone] [facility] doctor- [place])  Flu shot given We will call with labs  F/U 4 months

## 2014-12-22 NOTE — Assessment & Plan Note (Signed)
Diet controlled. I gave her the phone number for the last ophthalmologist that she saw Dr. Ricki Miller  Recheck A1c. Her weight is been fairly steady

## 2014-12-22 NOTE — Progress Notes (Signed)
Patient ID: [name], female   DOB: 1931/02/24, 81 y.o.   MRN: [id number]   Subjective:    Patient ID: [name], female    DOB: 22-Aug-1930, 81 y.o.   MRN: [id number]  Patient presents for 2 month F/U  patient here for interim follow-up on chronic medical problems. She is no concerns today. She missed her appointment with orthopedist but does not want to go right now. Her medications were reviewed. She's not had any chest pain or shortness of breath she is here today with her son. She is due for repeat lab special for chronic kidney disease and hyperparathyroidism. Her diabetes is diet controlled.   if she wants to see orthopedics she will just call me and we will get it set up again she does not need office visit for this  Review Of Systems:  GEN- denies fatigue, fever, weight loss,weakness, recent illness HEENT- denies eye drainage, change in vision, nasal discharge, CVS- denies chest pain, palpitations RESP- denies SOB, cough, wheeze ABD- denies N/V, change in stools, abd pain GU- denies dysuria, hematuria, dribbling, incontinence MSK- +joint pain, muscle aches, injury Neuro- denies headache, dizziness, syncope, seizure activity       Objective:    BP 130/78 mmHg  Pulse 68  Temp(Src) 98.7 F (37.1 C) (Oral)  Resp 14  Ht 4\' 11"  (1.499 m)  Wt 160 lb (72.576 kg)  BMI 32.30 kg/m2 GEN- NAD, alert and oriented x3 HEENT- PERRL, EOMI, non injected sclera, pink conjunctiva, MMM, oropharynx clear,  CVS- RRR, no murmur RESP-CTAB EXT- No edema Pulses- Radial 2+      Assessment & Plan:      Problem List Items Addressed This Visit    None    Visit Diagnoses    Need for prophylactic vaccination and inoculation against influenza    -  Primary    Relevant Orders    Flu Vaccine QUAD 36+ mos IM (Fluarix & Fluzone Quad PF       Note: This dictation was prepared with Dragon dictation along with smaller Company secretary. Any transcriptional errors that result from  this process are unintentional.

## 2014-12-22 NOTE — Assessment & Plan Note (Signed)
Blood pressure well controlled on current medication

## 2014-12-22 NOTE — Assessment & Plan Note (Signed)
Known chronic kidney disease. Has seen renal in the past. I'm been a recheck her PTH along with her GFR

## 2014-12-23 LAB — CBC WITH DIFFERENTIAL/PLATELET
Basophils Absolute: 0.1 10*3/uL (ref 0.0–0.1)
Basophils Relative: 1 % (ref 0–1)
Eosinophils Absolute: 0.2 10*3/uL (ref 0.0–0.7)
Eosinophils Relative: 4 % (ref 0–5)
HCT: 37.6 % (ref 36.0–46.0)
Hemoglobin: 12.1 g/dL (ref 12.0–15.0)
Lymphocytes Relative: 32 % (ref 12–46)
Lymphs Abs: 2 10*3/uL (ref 0.7–4.0)
MCH: 30.3 pg (ref 26.0–34.0)
MCHC: 32.2 g/dL (ref 30.0–36.0)
MCV: 94 fL (ref 78.0–100.0)
MPV: 10.9 fL (ref 8.6–12.4)
Monocytes Absolute: 0.6 10*3/uL (ref 0.1–1.0)
Monocytes Relative: 10 % (ref 3–12)
Neutro Abs: 3.3 10*3/uL (ref 1.7–7.7)
Neutrophils Relative %: 53 % (ref 43–77)
Platelets: 246 10*3/uL (ref 150–400)
RBC: 4 MIL/uL (ref 3.87–5.11)
RDW: 13.6 % (ref 11.5–15.5)
WBC: 6.2 10*3/uL (ref 4.0–10.5)

## 2014-12-23 LAB — PTH, INTACT AND CALCIUM
Calcium: 9.3 mg/dL (ref 8.4–10.5)
PTH: 91 pg/mL — ABNORMAL HIGH (ref 14–64)

## 2014-12-23 LAB — COMPLETE METABOLIC PANEL WITH GFR
ALT: 22 U/L (ref 6–29)
AST: 24 U/L (ref 10–35)
Albumin: 3.9 g/dL (ref 3.6–5.1)
Alkaline Phosphatase: 60 U/L (ref 33–130)
BUN: 28 mg/dL — ABNORMAL HIGH (ref 7–25)
CO2: 27 mmol/L (ref 20–31)
Calcium: 9.3 mg/dL (ref 8.6–10.4)
Chloride: 106 mmol/L (ref 98–110)
Creat: 1.81 mg/dL — ABNORMAL HIGH (ref 0.60–0.88)
GFR, Est African American: 30 mL/min — ABNORMAL LOW (ref >=60)
GFR, Est Non African American: 26 mL/min — ABNORMAL LOW (ref >=60)
Glucose, Bld: 117 mg/dL — ABNORMAL HIGH (ref 70–99)
Potassium: 4.1 mmol/L (ref 3.5–5.3)
Sodium: 142 mmol/L (ref 135–146)
Total Bilirubin: 0.6 mg/dL (ref 0.2–1.2)
Total Protein: 6.4 g/dL (ref 6.1–8.1)

## 2014-12-23 LAB — HEMOGLOBIN A1C
Hgb A1c MFr Bld: 6.1 % — ABNORMAL HIGH (ref <5.7)
Mean Plasma Glucose: 128 mg/dL — ABNORMAL HIGH (ref <117)

## 2015-01-01 ENCOUNTER — Encounter: Payer: Self-pay | Admitting: *Deleted

## 2015-01-19 ENCOUNTER — Other Ambulatory Visit: Payer: Self-pay | Admitting: Family Medicine

## 2015-01-19 NOTE — Telephone Encounter (Signed)
Medication refilled per protocol. 

## 2015-01-25 DIAGNOSIS — E1129 Type 2 diabetes mellitus with other diabetic kidney complication: Secondary | ICD-10-CM | POA: Diagnosis not present

## 2015-01-25 DIAGNOSIS — R809 Proteinuria, unspecified: Secondary | ICD-10-CM | POA: Diagnosis not present

## 2015-01-25 DIAGNOSIS — N183 Chronic kidney disease, stage 3 (moderate): Secondary | ICD-10-CM | POA: Diagnosis not present

## 2015-01-25 DIAGNOSIS — I10 Essential (primary) hypertension: Secondary | ICD-10-CM | POA: Diagnosis not present

## 2015-04-01 ENCOUNTER — Encounter: Payer: Self-pay | Admitting: Physician Assistant

## 2015-04-01 ENCOUNTER — Ambulatory Visit (INDEPENDENT_AMBULATORY_CARE_PROVIDER_SITE_OTHER): Payer: Medicare HMO | Admitting: Physician Assistant

## 2015-04-01 VITALS — BP 120/64 | HR 68 | Temp 99.7°F | Resp 14 | Ht 59.0 in | Wt 158.0 lb

## 2015-04-01 DIAGNOSIS — B9689 Other specified bacterial agents as the cause of diseases classified elsewhere: Secondary | ICD-10-CM

## 2015-04-01 DIAGNOSIS — J988 Other specified respiratory disorders: Secondary | ICD-10-CM

## 2015-04-01 MED ORDER — AZITHROMYCIN 250 MG PO TABS: ORAL_TABLET | ORAL | Status: DC

## 2015-04-01 NOTE — Progress Notes (Signed)
Patient ID: [name] MRN: [id number], DOB: Jan 07, 1931, 82 y.o. Date of Encounter: 04/01/2015, 1:24 PM    Chief Complaint:  Chief Complaint  Patient presents with  . Illness    x3 weeks- productive cough with thick green mucus, HA  . OA B Knees    reports increased pain in B knees and legs- states that she has swelling in her legs and feet as well     HPI: 82 y.o. year old AA female here with her daughter.  He states for the last several weeks she has been having mucus from her nose drainage down her throat and some cough. Says that when she talks early in the morning she has hoarseness secondary to the drainage. Has had no fevers or chills and no significant sore throat.  At the end of the visit they also comment that she has had some swelling at the end of the day in her ankles but that this is chronic for her. Her adds that patient has been doing a lot of cooking lately and has been on her feet doing this. Says that she has had her proper feet at the end of the day and the swelling resolves with this. Patient then adds that he had yesterday the swelling got better and she isn't having swelling today.  Daughter also says that she knows that with the colder temperatures that patient's arthritic pain will be increased. Says that currently patient takes 1 tramadol 3 times a day and is wondering what she can do if she has breakthrough pain with this. Asks if it is okay to use 2 of the tramadol at a time.     Home Meds:   Outpatient Prescriptions Prior to Visit  Medication Sig Dispense Refill  . albuterol (PROVENTIL HFA;VENTOLIN HFA) 108 (90 BASE) MCG/ACT inhaler Inhale 2 puffs into the lungs every 6 (six) hours as needed (wheezing/cough). 1 Inhaler 0  . amLODipine (NORVASC) 10 MG tablet TAKE 1 TABLET BY MOUTH DAILY 90 tablet 3  . atorvastatin (LIPITOR) 20 MG tablet TAKE 1 TABLET BY MOUTH DAILY 90 tablet 3  . cetirizine (ZYRTEC) 10 MG tablet Take 10 mg by mouth daily.    .  Cranberry-Cholecalciferol 4200-500 MG-UNIT CAPS Take 1 tablet by mouth daily.    . diclofenac sodium (VOLTAREN) 1 % GEL Apply dime size to bilateral knees three times a day as needed 1 Tube 6  . dipyridamole-aspirin (AGGRENOX) 200-25 MG 12hr capsule TAKE 1 CAPSULE BY MOUTH TWICE DAILY 180 capsule 0  . dipyridamole-aspirin (AGGRENOX) 200-25 MG 12hr capsule TAKE 1 CAPSULE BY MOUTH TWICE DAILY 120 capsule 0  . labetalol (NORMODYNE) 300 MG tablet Take 1 tablet (300 mg total) by mouth 2 (two) times daily. 180 tablet 2  . LORazepam (ATIVAN) 1 MG tablet Take 1 tablet (1 mg total) by mouth at bedtime. 30 tablet 3  . Omega-3 Fatty Acids (OMEGA-3 FISH OIL PO) Take by mouth.    Marland Kitchen omeprazole (PRILOSEC) 20 MG capsule TAKE ONE CAPSULE BY MOUTH EVERY DAY 90 capsule 3  . sodium bicarbonate 650 MG tablet TAKE 2 TABLETS BY MOUTH THREE TIMES DAILY 540 tablet 3  . traMADol (ULTRAM) 50 MG tablet Take 1 tablet (50 mg total) by mouth 2 (two) times daily as needed. for pain 60 tablet 2  . triamcinolone ointment (KENALOG) 0.1 % Apply to mouth sore twice a day as needed for 1 week 30 g 0  . Vitamin D, Cholecalciferol, 1000 UNITS TABS Take  1 tablet by mouth daily.    . fluticasone (FLONASE) 50 MCG/ACT nasal spray Place 2 sprays into the nose daily. 16 g 2  . alendronate (FOSAMAX) 35 MG tablet TAKE 1 TABLET BY MOUTH EVERY WEEK AS DIRECTED (Patient not taking: Reported on 04/01/2015) 12 tablet 3   No facility-administered medications prior to visit.    Allergies:  Allergies  Allergen Reactions  . Ciprofloxacin     Acute renal failure  . Diphenhydramine Hcl     REACTION: UNKNOWN REACTION  . Sulfonamide Derivatives Rash      Review of Systems: See HPI for pertinent ROS. All other ROS negative.    Physical Exam: Blood pressure 120/64, pulse 68, temperature 99.7 F (37.6 C), temperature source Oral, resp. rate 14, height 4\' 11"  (1.499 m), weight 158 lb (71.668 kg), SpO2 96 %., Body mass index is 31.89  kg/(m^2). General:  WNWD AAF. Appears in no acute distress. HEENT: Normocephalic, atraumatic, eyes without discharge, sclera non-icteric, nares are without discharge. Bilateral auditory canals clear, TM's are without perforation, pearly grey and translucent with reflective cone of light bilaterally. Oral cavity moist, posterior pharynx without exudate, erythema, peritonsillar abscess.  Neck: Supple. No thyromegaly. No lymphadenopathy. Lungs: Clear bilaterally to auscultation without wheezes, rales, or rhonchi. Breathing is unlabored. Heart: Regular rhythm. No murmurs, rubs, or gallops. Msk:  Strength and tone normal for age. Extremities/Skin:  No lower extremity edema at present. Neuro: Alert and oriented X 3. Moves all extremities spontaneously. Gait is normal. CNII-XII grossly in tact. Psych:  Responds to questions appropriately with a normal affect.     ASSESSMENT AND PLAN:  82 y.o. year old female with  1. Bacterial respiratory infection She is to take antibiotic as directed and complete it. If symptoms do not resolve within 1 week after completion of antibiotic and follow-up with [place]. - azithromycin (ZITHROMAX) 250 MG tablet; Day 1: Take 2 daily. Days 2-5: Take 1 daily.  Dispense: 6 tablet; Refill: 0  Told patient and daughter that it is okay for her to use 2 tramadol at a time if needed for pain relief.  She has no LE edema today.  Signed, [address] [place], [place], [facility] 04/01/2015 1:24 PM

## 2015-04-18 ENCOUNTER — Other Ambulatory Visit: Payer: Self-pay | Admitting: Family Medicine

## 2015-04-19 NOTE — Telephone Encounter (Signed)
Refill appropriate and filled per protocol. 

## 2015-04-26 ENCOUNTER — Encounter: Payer: Medicare Other | Admitting: Family Medicine

## 2015-06-01 ENCOUNTER — Other Ambulatory Visit: Payer: Self-pay | Admitting: Family Medicine

## 2015-06-01 DIAGNOSIS — E785 Hyperlipidemia, unspecified: Secondary | ICD-10-CM | POA: Diagnosis not present

## 2015-06-01 DIAGNOSIS — E119 Type 2 diabetes mellitus without complications: Secondary | ICD-10-CM | POA: Diagnosis not present

## 2015-06-01 DIAGNOSIS — I10 Essential (primary) hypertension: Secondary | ICD-10-CM | POA: Diagnosis not present

## 2015-06-01 LAB — CBC WITH DIFFERENTIAL/PLATELET
Basophils Absolute: 0.1 10*3/uL (ref 0.0–0.1)
Basophils Relative: 1 % (ref 0–1)
Eosinophils Absolute: 0.2 10*3/uL (ref 0.0–0.7)
Eosinophils Relative: 4 % (ref 0–5)
HCT: 36 % (ref 36.0–46.0)
Hemoglobin: 11.6 g/dL — ABNORMAL LOW (ref 12.0–15.0)
Lymphocytes Relative: 43 % (ref 12–46)
Lymphs Abs: 2.3 10*3/uL (ref 0.7–4.0)
MCH: 30.7 pg (ref 26.0–34.0)
MCHC: 32.2 g/dL (ref 30.0–36.0)
MCV: 95.2 fL (ref 78.0–100.0)
MPV: 11 fL (ref 8.6–12.4)
Monocytes Absolute: 0.5 10*3/uL (ref 0.1–1.0)
Monocytes Relative: 9 % (ref 3–12)
Neutro Abs: 2.3 10*3/uL (ref 1.7–7.7)
Neutrophils Relative %: 43 % (ref 43–77)
Platelets: 223 10*3/uL (ref 150–400)
RBC: 3.78 MIL/uL — ABNORMAL LOW (ref 3.87–5.11)
RDW: 13.9 % (ref 11.5–15.5)
WBC: 5.4 10*3/uL (ref 4.0–10.5)

## 2015-06-01 LAB — COMPREHENSIVE METABOLIC PANEL
ALT: 38 U/L — ABNORMAL HIGH (ref 6–29)
AST: 38 U/L — ABNORMAL HIGH (ref 10–35)
Albumin: 3.7 g/dL (ref 3.6–5.1)
Alkaline Phosphatase: 69 U/L (ref 33–130)
BUN: 27 mg/dL — ABNORMAL HIGH (ref 7–25)
CO2: 27 mmol/L (ref 20–31)
Calcium: 9.4 mg/dL (ref 8.6–10.4)
Chloride: 105 mmol/L (ref 98–110)
Creat: 1.64 mg/dL — ABNORMAL HIGH (ref 0.60–0.88)
Glucose, Bld: 95 mg/dL (ref 70–99)
Potassium: 3.8 mmol/L (ref 3.5–5.3)
Sodium: 142 mmol/L (ref 135–146)
Total Bilirubin: 0.4 mg/dL (ref 0.2–1.2)
Total Protein: 6.3 g/dL (ref 6.1–8.1)

## 2015-06-01 LAB — LIPID PANEL
Cholesterol: 138 mg/dL (ref 125–200)
HDL: 52 mg/dL (ref >=46)
LDL Cholesterol: 60 mg/dL (ref <130)
Total CHOL/HDL Ratio: 2.7 Ratio (ref <=5.0)
Triglycerides: 129 mg/dL (ref <150)
VLDL: 26 mg/dL (ref <30)

## 2015-06-02 LAB — HEMOGLOBIN A1C
Hgb A1c MFr Bld: 6.1 % — ABNORMAL HIGH (ref <5.7)
Mean Plasma Glucose: 128 mg/dL — ABNORMAL HIGH (ref <117)

## 2015-06-11 ENCOUNTER — Ambulatory Visit (INDEPENDENT_AMBULATORY_CARE_PROVIDER_SITE_OTHER): Payer: Medicare HMO | Admitting: Family Medicine

## 2015-06-11 ENCOUNTER — Encounter: Payer: Self-pay | Admitting: Family Medicine

## 2015-06-11 VITALS — BP 130/78 | HR 68 | Temp 98.2°F | Resp 12 | Ht 59.0 in | Wt 162.0 lb

## 2015-06-11 DIAGNOSIS — R945 Abnormal results of liver function studies: Principal | ICD-10-CM

## 2015-06-11 DIAGNOSIS — R799 Abnormal finding of blood chemistry, unspecified: Secondary | ICD-10-CM | POA: Diagnosis not present

## 2015-06-11 DIAGNOSIS — I10 Essential (primary) hypertension: Secondary | ICD-10-CM | POA: Diagnosis not present

## 2015-06-11 DIAGNOSIS — N183 Chronic kidney disease, stage 3 unspecified: Secondary | ICD-10-CM

## 2015-06-11 DIAGNOSIS — M150 Primary generalized (osteo)arthritis: Secondary | ICD-10-CM

## 2015-06-11 DIAGNOSIS — R7989 Other specified abnormal findings of blood chemistry: Secondary | ICD-10-CM

## 2015-06-11 DIAGNOSIS — E213 Hyperparathyroidism, unspecified: Secondary | ICD-10-CM | POA: Diagnosis not present

## 2015-06-11 DIAGNOSIS — E119 Type 2 diabetes mellitus without complications: Secondary | ICD-10-CM

## 2015-06-11 DIAGNOSIS — M159 Polyosteoarthritis, unspecified: Secondary | ICD-10-CM

## 2015-06-11 DIAGNOSIS — M8949 Other hypertrophic osteoarthropathy, multiple sites: Secondary | ICD-10-CM

## 2015-06-11 MED ORDER — FLUTICASONE PROPIONATE 50 MCG/ACT NA SUSP: 2.0000 | Freq: Every day | NASAL | Status: DC | PRN

## 2015-06-11 NOTE — Progress Notes (Signed)
Patient ID: [name], female   DOB: 1930/03/29, 82 y.o.   MRN: [id number]   Subjective:    Patient ID: [name], female    DOB: 04-Jul-1930, 82 y.o.   MRN: [id number]  Patient presents for F/U Lab results Patient follow up her labs. She's history of chronic kidney disease her renal function actually improved with a creatinine 1.16 she's still on sodium bicarbonate. She has not seen nephrology in greater than 1 year. She's not had any significant swelling. She continues have problems with her arthritis which acts up more when the weather changes. She does take Tylenol and tramadol as needed.  On review of labs her liver function will mildly elevated that she does admit that she had a viral illness a couple weeks before her lab draw.  Her labs were discussed in detail with her as well as her son. It was also noted that she is still diet controlled diabetic  Review Of Systems:  GEN- denies fatigue, fever, weight loss,weakness, recent illness HEENT- denies eye drainage, change in vision, nasal discharge, CVS- denies chest pain, palpitations RESP- denies SOB, cough, wheeze ABD- denies N/V, change in stools, abd pain GU- denies dysuria, hematuria, dribbling, incontinence MSK- + joint pain, muscle aches, injury Neuro- denies headache, dizziness, syncope, seizure activity       Objective:    BP 130/78 mmHg  Pulse 68  Temp(Src) 98.2 F (36.8 C) (Oral)  Resp 12  Ht 4\' 11"  (1.499 m)  Wt 162 lb (73.483 kg)  BMI 32.70 kg/m2 GEN- NAD, alert and oriented x3 HEENT- PERRL, EOMI, non injected sclera, pink conjunctiva, MMM, oropharynx clear CVS- RRR, no murmur RESP-CTAB EXT-pedal edema Pulses- Radial, DP- 2+        Assessment & Plan:      Problem List Items Addressed This Visit    None    Visit Diagnoses    Elevated liver function tests    -  Primary    Relevant Orders    Comprehensive metabolic panel       Note: This dictation was prepared with Dragon dictation  along with smaller phrase technology. Any transcriptional errors that result from this process are unintentional.

## 2015-06-11 NOTE — Patient Instructions (Signed)
Referral to kidney doctor  Dr. [facility] -  [place]  [place], [place] [address]  Main: [phone]  We will call with liver labs Restart the nasal spray  F/U  4 MONTHS

## 2015-06-12 LAB — COMPREHENSIVE METABOLIC PANEL
ALT: 38 U/L — ABNORMAL HIGH (ref 6–29)
AST: 36 U/L — ABNORMAL HIGH (ref 10–35)
Albumin: 4.1 g/dL (ref 3.6–5.1)
Alkaline Phosphatase: 81 U/L (ref 33–130)
BUN: 25 mg/dL (ref 7–25)
CO2: 26 mmol/L (ref 20–31)
Calcium: 9.3 mg/dL (ref 8.6–10.4)
Chloride: 106 mmol/L (ref 98–110)
Creat: 1.57 mg/dL — ABNORMAL HIGH (ref 0.60–0.88)
Glucose, Bld: 109 mg/dL — ABNORMAL HIGH (ref 70–99)
Potassium: 4.3 mmol/L (ref 3.5–5.3)
Sodium: 143 mmol/L (ref 135–146)
Total Bilirubin: 0.4 mg/dL (ref 0.2–1.2)
Total Protein: 6.7 g/dL (ref 6.1–8.1)

## 2015-06-13 NOTE — Assessment & Plan Note (Signed)
Diet controlled.  

## 2015-06-13 NOTE — Assessment & Plan Note (Signed)
Pt still on bicarb, renal function has improved some, electrolytes look good Refer back for yearly check up with nephrology, see if bicarb still needed etc

## 2015-06-13 NOTE — Assessment & Plan Note (Signed)
Continue tylenol and Ultram LFT were a little elevated she had recent URI, she has not taken too much tylenol based on our conversations Recheck CMET

## 2015-06-13 NOTE — Assessment & Plan Note (Signed)
Well controlled 

## 2015-06-17 ENCOUNTER — Encounter: Payer: Self-pay | Admitting: *Deleted

## 2015-06-19 ENCOUNTER — Other Ambulatory Visit: Payer: Self-pay | Admitting: Family Medicine

## 2015-06-21 NOTE — Telephone Encounter (Signed)
Refill appropriate and filled per protocol. 

## 2015-08-09 ENCOUNTER — Other Ambulatory Visit (HOSPITAL_COMMUNITY)
Admission: RE | Admit: 2015-08-09 | Discharge: 2015-08-09 | Disposition: A | Discharge status: Home / Self Care | Payer: Medicare HMO | Source: Ambulatory Visit | Attending: Nephrology | Admitting: Nephrology

## 2015-08-09 DIAGNOSIS — Z79899 Other long term (current) drug therapy: Secondary | ICD-10-CM | POA: Insufficient documentation

## 2015-08-09 DIAGNOSIS — R809 Proteinuria, unspecified: Secondary | ICD-10-CM | POA: Insufficient documentation

## 2015-08-09 DIAGNOSIS — N183 Chronic kidney disease, stage 3 (moderate): Secondary | ICD-10-CM | POA: Diagnosis not present

## 2015-08-09 DIAGNOSIS — D509 Iron deficiency anemia, unspecified: Secondary | ICD-10-CM | POA: Diagnosis not present

## 2015-08-09 DIAGNOSIS — E559 Vitamin D deficiency, unspecified: Secondary | ICD-10-CM | POA: Diagnosis not present

## 2015-08-09 DIAGNOSIS — I10 Essential (primary) hypertension: Secondary | ICD-10-CM | POA: Diagnosis not present

## 2015-08-09 LAB — RENAL FUNCTION PANEL
Albumin: 3.8 g/dL (ref 3.5–5.0)
Anion gap: 7 (ref 5–15)
BUN: 29 mg/dL — ABNORMAL HIGH (ref 6–20)
CO2: 28 mmol/L (ref 22–32)
Calcium: 9.3 mg/dL (ref 8.9–10.3)
Chloride: 108 mmol/L (ref 101–111)
Creatinine, Ser: 1.49 mg/dL — ABNORMAL HIGH (ref 0.44–1.00)
GFR calc Af Amer: 36 mL/min — ABNORMAL LOW (ref >60)
GFR calc non Af Amer: 31 mL/min — ABNORMAL LOW (ref >60)
Glucose, Bld: 105 mg/dL — ABNORMAL HIGH (ref 65–99)
Phosphorus: 3.1 mg/dL (ref 2.5–4.6)
Potassium: 3.7 mmol/L (ref 3.5–5.1)
Sodium: 143 mmol/L (ref 135–145)

## 2015-08-09 LAB — FERRITIN: Ferritin: 62 ng/mL (ref 11–307)

## 2015-08-09 LAB — HEMOGLOBIN AND HEMATOCRIT, BLOOD
HCT: 35.9 % — ABNORMAL LOW (ref 36.0–46.0)
Hemoglobin: 11.8 g/dL — ABNORMAL LOW (ref 12.0–15.0)

## 2015-08-09 LAB — PROTEIN / CREATININE RATIO, URINE
Creatinine, Urine: 172.41 mg/dL
Protein Creatinine Ratio: 0.09 mg/mg{Cre} (ref 0.00–0.15)
Total Protein, Urine: 16 mg/dL

## 2015-08-09 LAB — IRON AND TIBC
Iron: 63 ug/dL (ref 28–170)
Saturation Ratios: 20 % (ref 10.4–31.8)
TIBC: 308 ug/dL (ref 250–450)
UIBC: 245 ug/dL

## 2015-08-10 LAB — PARATHYROID HORMONE, INTACT (NO CA): PTH: 46 pg/mL (ref 15–65)

## 2015-08-10 LAB — VITAMIN D 25 HYDROXY (VIT D DEFICIENCY, FRACTURES): Vit D, 25-Hydroxy: 54.3 ng/mL (ref 30.0–100.0)

## 2015-08-13 ENCOUNTER — Ambulatory Visit (HOSPITAL_COMMUNITY)
Admission: EM | Admit: 2015-08-13 | Discharge: 2015-08-13 | Disposition: A | Discharge status: Home / Self Care | Payer: Medicare HMO | Attending: Family Medicine | Admitting: Family Medicine

## 2015-08-13 ENCOUNTER — Encounter (HOSPITAL_COMMUNITY): Payer: Self-pay

## 2015-08-13 DIAGNOSIS — Z882 Allergy status to sulfonamides status: Secondary | ICD-10-CM | POA: Insufficient documentation

## 2015-08-13 DIAGNOSIS — N183 Chronic kidney disease, stage 3 unspecified: Secondary | ICD-10-CM

## 2015-08-13 DIAGNOSIS — R1030 Lower abdominal pain, unspecified: Secondary | ICD-10-CM | POA: Diagnosis not present

## 2015-08-13 DIAGNOSIS — Z881 Allergy status to other antibiotic agents status: Secondary | ICD-10-CM | POA: Insufficient documentation

## 2015-08-13 DIAGNOSIS — F329 Major depressive disorder, single episode, unspecified: Secondary | ICD-10-CM | POA: Insufficient documentation

## 2015-08-13 DIAGNOSIS — E119 Type 2 diabetes mellitus without complications: Secondary | ICD-10-CM | POA: Insufficient documentation

## 2015-08-13 DIAGNOSIS — E785 Hyperlipidemia, unspecified: Secondary | ICD-10-CM | POA: Insufficient documentation

## 2015-08-13 DIAGNOSIS — R3915 Urgency of urination: Secondary | ICD-10-CM

## 2015-08-13 DIAGNOSIS — Z8673 Personal history of transient ischemic attack (TIA), and cerebral infarction without residual deficits: Secondary | ICD-10-CM | POA: Diagnosis not present

## 2015-08-13 DIAGNOSIS — F419 Anxiety disorder, unspecified: Secondary | ICD-10-CM | POA: Insufficient documentation

## 2015-08-13 DIAGNOSIS — M1990 Unspecified osteoarthritis, unspecified site: Secondary | ICD-10-CM | POA: Diagnosis not present

## 2015-08-13 DIAGNOSIS — Z888 Allergy status to other drugs, medicaments and biological substances status: Secondary | ICD-10-CM | POA: Diagnosis not present

## 2015-08-13 DIAGNOSIS — K219 Gastro-esophageal reflux disease without esophagitis: Secondary | ICD-10-CM | POA: Insufficient documentation

## 2015-08-13 DIAGNOSIS — R350 Frequency of micturition: Secondary | ICD-10-CM | POA: Insufficient documentation

## 2015-08-13 DIAGNOSIS — I129 Hypertensive chronic kidney disease with stage 1 through stage 4 chronic kidney disease, or unspecified chronic kidney disease: Secondary | ICD-10-CM | POA: Diagnosis not present

## 2015-08-13 DIAGNOSIS — R109 Unspecified abdominal pain: Secondary | ICD-10-CM | POA: Diagnosis present

## 2015-08-13 DIAGNOSIS — Z79899 Other long term (current) drug therapy: Secondary | ICD-10-CM | POA: Insufficient documentation

## 2015-08-13 DIAGNOSIS — R69 Illness, unspecified: Secondary | ICD-10-CM | POA: Diagnosis not present

## 2015-08-13 DIAGNOSIS — M810 Age-related osteoporosis without current pathological fracture: Secondary | ICD-10-CM | POA: Insufficient documentation

## 2015-08-13 DIAGNOSIS — N182 Chronic kidney disease, stage 2 (mild): Secondary | ICD-10-CM | POA: Insufficient documentation

## 2015-08-13 LAB — URINE MICROSCOPIC-ADD ON

## 2015-08-13 LAB — URINALYSIS, ROUTINE W REFLEX MICROSCOPIC
Bilirubin Urine: NEGATIVE
Glucose, UA: NEGATIVE mg/dL
Hgb urine dipstick: NEGATIVE
Ketones, ur: NEGATIVE mg/dL
Nitrite: NEGATIVE
Protein, ur: NEGATIVE mg/dL
Specific Gravity, Urine: 1.017 (ref 1.005–1.030)
pH: 6.5 (ref 5.0–8.0)

## 2015-08-13 LAB — POCT URINALYSIS DIP (DEVICE)
Bilirubin Urine: NEGATIVE
Glucose, UA: NEGATIVE mg/dL
Hgb urine dipstick: NEGATIVE
Ketones, ur: NEGATIVE mg/dL
Leukocytes, UA: NEGATIVE
Nitrite: NEGATIVE
Protein, ur: NEGATIVE mg/dL
Specific Gravity, Urine: 1.015 (ref 1.005–1.030)
Urobilinogen, UA: 0.2 mg/dL (ref 0.0–1.0)
pH: 7 (ref 5.0–8.0)

## 2015-08-13 MED ORDER — CEPHALEXIN 500 MG PO CAPS: 500.0000 mg | ORAL_CAPSULE | Freq: Two times a day (BID) | ORAL | Status: DC

## 2015-08-13 MED ORDER — NITROFURANTOIN MONOHYD MACRO 100 MG PO CAPS: 100.0000 mg | ORAL_CAPSULE | Freq: Two times a day (BID) | ORAL | Status: DC

## 2015-08-13 NOTE — ED Provider Notes (Signed)
CSN: [id number]     Arrival date & time 08/13/15  1848 History   First MD Initiated Contact with Patient 08/13/15 2026     Chief Complaint  Patient presents with  . Flank Pain   (Consider location/radiation/quality/duration/timing/severity/associated sxs/prior Treatment) Patient is a 82 y.o. female presenting with flank pain. The history is provided by the patient and a relative. No language interpreter was used.  Flank Pain Pertinent negatives include no shortness of breath.  Patient and daughter are historians.  Patient complains of onset suprapubic pain and urinary frequency, similar to previous episodes of cystitis.  She has CKD III, recently had renal function panel (05/15) and with GFR 36, Cr 1.49. She denies fevers or chills, no dysuria but has had more urgency and frequency.  No N/V/D.  She has had cystitis before, last one several months ago.   Allergies to Cipro (AKI) and Sulfa (rash).      Past Medical History  Diagnosis Date  . Diabetes mellitus   . Hypertension   . Stroke ([place])   . GERD (gastroesophageal reflux disease)   . Hyperlipidemia   . UTI (lower urinary tract infection)   . CKD (chronic kidney disease)     Dr. Hinda Lenis  . Arthritis   . Anxiety   . Depression   . Osteoporosis    Past Surgical History  Procedure Laterality Date  . Cholecystectomy     Family History  Problem Relation Age of Onset  . Cancer Mother   . Cancer Father    Social History  Substance Use Topics  . Smoking status: Never Smoker   . Smokeless tobacco: Never Used  . Alcohol Use: No   OB History    No data available     Review of Systems  Constitutional: Positive for fatigue. Negative for fever, chills, diaphoresis, appetite change and unexpected weight change.  Respiratory: Negative for cough, chest tightness and shortness of breath.   Endocrine: Positive for polyuria.  Genitourinary: Positive for urgency, flank pain and pelvic pain. Negative for dysuria and hematuria.      Has some urinary incontinence associated with urgency; uses undergarments.     Allergies  Ciprofloxacin; Diphenhydramine hcl; and Sulfonamide derivatives  Home Medications   Prior to Admission medications   Medication Sig Start Date End Date Taking? Authorizing Provider  amLODipine (NORVASC) 10 MG tablet TAKE 1 TABLET BY MOUTH DAILY 12/22/14  Yes Alycia Rossetti, MD  atorvastatin (LIPITOR) 20 MG tablet TAKE 1 TABLET BY MOUTH DAILY 12/22/14  Yes Alycia Rossetti, MD  Cranberry-Cholecalciferol 4200-500 MG-UNIT CAPS Take 1 tablet by mouth daily.   Yes Historical Provider, MD  dipyridamole-aspirin (AGGRENOX) 200-25 MG 12hr capsule TAKE 1 CAPSULE BY MOUTH TWICE DAILY 04/19/15  Yes Alycia Rossetti, MD  dipyridamole-aspirin (AGGRENOX) 200-25 MG 12hr capsule TAKE 1 CAPSULE BY MOUTH TWICE DAILY 06/21/15  Yes Alycia Rossetti, MD  labetalol (NORMODYNE) 300 MG tablet Take 1 tablet (300 mg total) by mouth 2 (two) times daily. 12/22/14  Yes Alycia Rossetti, MD  LORazepam (ATIVAN) 1 MG tablet Take 1 tablet (1 mg total) by mouth at bedtime. 12/22/14  Yes Alycia Rossetti, MD  Omega-3 Fatty Acids (OMEGA-3 FISH OIL PO) Take by mouth.   Yes Historical Provider, MD  omeprazole (PRILOSEC) 20 MG capsule TAKE ONE CAPSULE BY MOUTH EVERY DAY 06/21/15  Yes Alycia Rossetti, MD  sodium bicarbonate 650 MG tablet TAKE 2 TABLETS BY MOUTH THREE TIMES DAILY 12/22/14  Yes Alycia Rossetti,  MD  traMADol (ULTRAM) 50 MG tablet Take 1 tablet (50 mg total) by mouth 2 (two) times daily as needed. for pain 12/22/14  Yes Alycia Rossetti, MD  Vitamin D, Cholecalciferol, 1000 UNITS TABS Take 1 tablet by mouth daily.   Yes Historical Provider, MD  albuterol (PROVENTIL HFA;VENTOLIN HFA) 108 (90 BASE) MCG/ACT inhaler Inhale 2 puffs into the lungs every 6 (six) hours as needed (wheezing/cough). 02/07/13   Amber Fidel Levy, MD  cetirizine (ZYRTEC) 10 MG tablet Take 10 mg by mouth daily.    Historical Provider, MD  diclofenac sodium  (VOLTAREN) 1 % GEL Apply dime size to bilateral knees three times a day as needed 10/14/14   Alycia Rossetti, MD  fluticasone [facility]) 50 MCG/ACT nasal spray Place 2 sprays into both nostrils daily as needed for allergies or rhinitis. 06/11/15 10/07/17  Alycia Rossetti, MD  triamcinolone ointment (KENALOG) 0.1 % Apply to mouth sore twice a day as needed for 1 week 03/25/14   Alycia Rossetti, MD   Meds Ordered and Administered this Visit  Medications - No data to display  BP 154/72 mmHg  Pulse 70  Temp(Src) 97.9 F (36.6 C) (Oral)  Resp 16  SpO2 95% No data found.   Physical Exam  Constitutional: She appears well-developed and well-nourished. No distress.  HENT:  Head: Normocephalic.  Mouth/Throat: Oropharynx is clear and moist. No oropharyngeal exudate.  Neck: Normal range of motion. Neck supple.  Cardiovascular: Normal rate and normal heart sounds.   Pulmonary/Chest: Effort normal and breath sounds normal. No respiratory distress. She has no wheezes. She has no rales. She exhibits no tenderness.  Abdominal: Soft. Bowel sounds are normal. She exhibits no distension and no mass. There is no rebound and no guarding.  No CVA tenderness.  Some mild suprapubic tenderness to palpation in midline.   Lymphadenopathy:    She has no cervical adenopathy.  Skin: She is not diaphoretic.    ED Course  Procedures (including critical care time)  Labs Review Labs Reviewed  URINE CULTURE  URINALYSIS, ROUTINE W REFLEX MICROSCOPIC (NOT AT [facility])    Imaging Review No results found.   Visual Acuity Review  Right Eye Distance:   Left Eye Distance:   Bilateral Distance:    Right Eye Near:   Left Eye Near:    Bilateral Near:         MDM   1. Chronic kidney disease, stage 2 (mild)   2. Urinary urgency    Patient with prior history of cystitis, now presenting with sxs c/w prior episodes. No signs/sxs systemic illness. UA dipstick in [facility] today is unremarkable, however her sxs and  history raise concern for cystitis.   Sending specimen for formal UA with microscopy, Urine Culture.   Keflex 500mg  bid x 7 days, for follow up with her primary doctor on Monday, or return to the urgent care.    Willeen Niece, MD 08/13/15 2109

## 2015-08-13 NOTE — Discharge Instructions (Signed)
It is a pleasure to see you today in the urgent care center.   I am prescribing KEFLEX (cephalexin) 500mg  capsules, take 1 capsule by mouth twice daily, for 7 days.   We are sending the urine specimen for urine culture today as well.  Please follow up with Dr Buelah Manis or your nephrologist for this problem at the beginning of next week.   Please return to the urgent care center in the meantime if you have any worsening of your symptoms, or with any new concerns.

## 2015-08-13 NOTE — ED Notes (Signed)
Patient states she has been having kidney pain x6 days and has hx of Urinary tract infections. Patient has taken Tramadol to treat pain. No acute distress

## 2015-08-15 LAB — URINE CULTURE

## 2015-08-16 ENCOUNTER — Ambulatory Visit (INDEPENDENT_AMBULATORY_CARE_PROVIDER_SITE_OTHER): Payer: Medicare HMO | Admitting: Physician Assistant

## 2015-08-16 ENCOUNTER — Encounter: Payer: Self-pay | Admitting: Physician Assistant

## 2015-08-16 VITALS — BP 162/90 | HR 60 | Temp 98.0°F | Resp 18 | Wt 160.0 lb

## 2015-08-16 DIAGNOSIS — Z09 Encounter for follow-up examination after completed treatment for conditions other than malignant neoplasm: Secondary | ICD-10-CM | POA: Diagnosis not present

## 2015-08-16 DIAGNOSIS — N390 Urinary tract infection, site not specified: Secondary | ICD-10-CM | POA: Diagnosis not present

## 2015-08-16 NOTE — Progress Notes (Signed)
Patient ID: [name] MRN: [id number], DOB: 1930-05-29, 82 y.o. Date of Encounter: 08/16/2015, 1:59 PM    Chief Complaint:  Chief Complaint  Patient presents with  . kidney problem    has CKD, seen in ED for UTI, told to follow up here today     HPI: 82 y.o. year old AA female here for f/u after ER visit. Her son is with her tody.   I reviewed ER note 08/13/2015.  C/O suprapubic pain and urinary frequency, similar to prior UTIs. She had had no fever, chills, dysuria---but had urgency, frequency.  Their note stated "UA dipstick unremarkable, however sx concerning for cystitis.  Sent culture.  Rxed Keflex 500mg  BID x 7 days.   Today I reviewed Urine Culture Result. Shows "Multiple Species. Recommend Recollection if indicated"  Today pt says she is feeling better. Urgency, frequency have improved. Not having any suprapubic discomfort. No fever/chills, no back pain.      Home Meds:   Outpatient Prescriptions Prior to Visit  Medication Sig Dispense Refill  . albuterol (PROVENTIL HFA;VENTOLIN HFA) 108 (90 BASE) MCG/ACT inhaler Inhale 2 puffs into the lungs every 6 (six) hours as needed (wheezing/cough). 1 Inhaler 0  . amLODipine (NORVASC) 10 MG tablet TAKE 1 TABLET BY MOUTH DAILY 90 tablet 3  . atorvastatin (LIPITOR) 20 MG tablet TAKE 1 TABLET BY MOUTH DAILY 90 tablet 3  . cephALEXin (KEFLEX) 500 MG capsule Take 1 capsule (500 mg total) by mouth 2 (two) times daily. 14 capsule 0  . cetirizine (ZYRTEC) 10 MG tablet Take 10 mg by mouth daily.    . Cranberry-Cholecalciferol 4200-500 MG-UNIT CAPS Take 1 tablet by mouth daily.    . diclofenac sodium (VOLTAREN) 1 % GEL Apply dime size to bilateral knees three times a day as needed 1 Tube 6  . dipyridamole-aspirin (AGGRENOX) 200-25 MG 12hr capsule TAKE 1 CAPSULE BY MOUTH TWICE DAILY 120 capsule 0  . fluticasone (FLONASE) 50 MCG/ACT nasal spray Place 2 sprays into both nostrils daily as needed for allergies or rhinitis. 16 g 2  .  labetalol (NORMODYNE) 300 MG tablet Take 1 tablet (300 mg total) by mouth 2 (two) times daily. 180 tablet 2  . LORazepam (ATIVAN) 1 MG tablet Take 1 tablet (1 mg total) by mouth at bedtime. 30 tablet 3  . Omega-3 Fatty Acids (OMEGA-3 FISH OIL PO) Take by mouth.    Marland Kitchen omeprazole (PRILOSEC) 20 MG capsule TAKE ONE CAPSULE BY MOUTH EVERY DAY 90 capsule 3  . sodium bicarbonate 650 MG tablet TAKE 2 TABLETS BY MOUTH THREE TIMES DAILY 540 tablet 3  . traMADol (ULTRAM) 50 MG tablet Take 1 tablet (50 mg total) by mouth 2 (two) times daily as needed. for pain 60 tablet 2  . triamcinolone ointment (KENALOG) 0.1 % Apply to mouth sore twice a day as needed for 1 week 30 g 0  . Vitamin D, Cholecalciferol, 1000 UNITS TABS Take 1 tablet by mouth daily.    Marland Kitchen dipyridamole-aspirin (AGGRENOX) 200-25 MG 12hr capsule TAKE 1 CAPSULE BY MOUTH TWICE DAILY 180 capsule 1   No facility-administered medications prior to visit.    Allergies:  Allergies  Allergen Reactions  . Ciprofloxacin     Acute renal failure  . Diphenhydramine Hcl     REACTION: UNKNOWN REACTION  . Sulfonamide Derivatives Rash      Review of Systems: See HPI for pertinent ROS. All other ROS negative.    Physical Exam: Blood pressure 162/90, pulse 60,  temperature 98 F (36.7 C), temperature source Oral, resp. rate 18, weight 160 lb (72.576 kg)., Body mass index is 32.3 kg/(m^2). General:  WNWD AAF. Appears in no acute distress. Neck: Supple. No thyromegaly. No lymphadenopathy. Lungs: Clear bilaterally to auscultation without wheezes, rales, or rhonchi. Breathing is unlabored. Heart: Regular rhythm. No murmurs, rubs, or gallops. Abdomen: Soft, non-tender, non-distended with normoactive bowel sounds. No hepatomegaly. No rebound/guarding. No obvious abdominal masses. Msk:  Strength and tone normal for age. No costophrenic Angle tenderness with percussion bilaterally.  Extremities/Skin: Warm and dry.  Neuro: Alert and oriented X 3. Moves all  extremities spontaneously. Gait is normal. CNII-XII grossly in tact. Psych:  Responds to questions appropriately with a normal affect.     ASSESSMENT AND PLAN:  82 y.o. year old female with   1. Follow up ER visit  2. Urinary tract infection, site not specified Urine culture c/w contamination.  UA was not c/w infection.  Can stop Keflex. Pt feeling improved.    [address] [place], [place], [facility] 08/16/2015 1:59 PM

## 2015-08-18 ENCOUNTER — Ambulatory Visit: Payer: Medicare HMO | Admitting: Family Medicine

## 2015-08-18 DIAGNOSIS — E1129 Type 2 diabetes mellitus with other diabetic kidney complication: Secondary | ICD-10-CM | POA: Diagnosis not present

## 2015-08-18 DIAGNOSIS — N183 Chronic kidney disease, stage 3 (moderate): Secondary | ICD-10-CM | POA: Diagnosis not present

## 2015-08-18 DIAGNOSIS — I10 Essential (primary) hypertension: Secondary | ICD-10-CM | POA: Diagnosis not present

## 2015-08-18 DIAGNOSIS — E872 Acidosis: Secondary | ICD-10-CM | POA: Diagnosis not present

## 2015-08-18 DIAGNOSIS — R809 Proteinuria, unspecified: Secondary | ICD-10-CM | POA: Diagnosis not present

## 2015-08-22 ENCOUNTER — Other Ambulatory Visit: Payer: Self-pay | Admitting: Family Medicine

## 2015-08-24 NOTE — Telephone Encounter (Signed)
Ok to refill??  Last office visit 08/16/2015.  Last refill 12/22/2014, #3 refills.

## 2015-08-24 NOTE — Telephone Encounter (Signed)
Medication called to pharmacy. 

## 2015-08-24 NOTE — Telephone Encounter (Signed)
Okay to refill? 

## 2015-10-02 DIAGNOSIS — H524 Presbyopia: Secondary | ICD-10-CM | POA: Diagnosis not present

## 2015-10-11 ENCOUNTER — Ambulatory Visit: Payer: Medicare HMO | Admitting: Family Medicine

## 2015-10-14 DIAGNOSIS — H25813 Combined forms of age-related cataract, bilateral: Secondary | ICD-10-CM | POA: Diagnosis not present

## 2015-10-24 ENCOUNTER — Other Ambulatory Visit: Payer: Self-pay | Admitting: Family Medicine

## 2015-10-25 NOTE — Telephone Encounter (Signed)
Ok to refill??  Last office visit 08/16/2015.  Last refill 12/22/2014, #2 refills

## 2015-10-25 NOTE — Telephone Encounter (Signed)
okay

## 2015-10-27 NOTE — Telephone Encounter (Signed)
Medication called to pharmacy. 

## 2015-10-30 DIAGNOSIS — Z0100 Encounter for examination of eyes and vision without abnormal findings: Secondary | ICD-10-CM | POA: Diagnosis not present

## 2015-12-11 ENCOUNTER — Other Ambulatory Visit: Payer: Self-pay | Admitting: Family Medicine

## 2015-12-13 ENCOUNTER — Other Ambulatory Visit: Payer: Self-pay | Admitting: *Deleted

## 2015-12-13 NOTE — Telephone Encounter (Signed)
Okay to refill give 2, pt needs OV for routine follow up

## 2015-12-13 NOTE — Telephone Encounter (Signed)
See note

## 2016-01-16 ENCOUNTER — Other Ambulatory Visit: Payer: Self-pay | Admitting: Family Medicine

## 2016-01-17 NOTE — Telephone Encounter (Signed)
Okay to refill, needs OV

## 2016-01-17 NOTE — Telephone Encounter (Signed)
Ok to refill Tramadol??  Last office visit 08/16/2015.  Last refill 12/13/2015.

## 2016-01-18 NOTE — Telephone Encounter (Signed)
Medication called to pharmacy.  Letter sent.

## 2016-01-25 ENCOUNTER — Other Ambulatory Visit: Payer: Self-pay | Admitting: Family Medicine

## 2016-02-02 ENCOUNTER — Ambulatory Visit (INDEPENDENT_AMBULATORY_CARE_PROVIDER_SITE_OTHER): Payer: Medicare HMO | Admitting: Family Medicine

## 2016-02-02 ENCOUNTER — Encounter: Payer: Self-pay | Admitting: Family Medicine

## 2016-02-02 VITALS — BP 138/88 | HR 60 | Temp 98.8°F | Resp 16 | Ht 59.0 in | Wt 155.0 lb

## 2016-02-02 DIAGNOSIS — F418 Other specified anxiety disorders: Secondary | ICD-10-CM

## 2016-02-02 DIAGNOSIS — N183 Chronic kidney disease, stage 3 unspecified: Secondary | ICD-10-CM

## 2016-02-02 DIAGNOSIS — M170 Bilateral primary osteoarthritis of knee: Secondary | ICD-10-CM | POA: Diagnosis not present

## 2016-02-02 DIAGNOSIS — Z23 Encounter for immunization: Secondary | ICD-10-CM | POA: Diagnosis not present

## 2016-02-02 DIAGNOSIS — I10 Essential (primary) hypertension: Secondary | ICD-10-CM

## 2016-02-02 DIAGNOSIS — E119 Type 2 diabetes mellitus without complications: Secondary | ICD-10-CM | POA: Diagnosis not present

## 2016-02-02 DIAGNOSIS — R69 Illness, unspecified: Secondary | ICD-10-CM | POA: Diagnosis not present

## 2016-02-02 DIAGNOSIS — F32A Depression, unspecified: Secondary | ICD-10-CM

## 2016-02-02 DIAGNOSIS — F329 Major depressive disorder, single episode, unspecified: Secondary | ICD-10-CM

## 2016-02-02 DIAGNOSIS — F419 Anxiety disorder, unspecified: Secondary | ICD-10-CM

## 2016-02-02 LAB — COMPREHENSIVE METABOLIC PANEL
ALT: 17 U/L (ref 6–29)
AST: 21 U/L (ref 10–35)
Albumin: 3.8 g/dL (ref 3.6–5.1)
Alkaline Phosphatase: 59 U/L (ref 33–130)
BUN: 25 mg/dL (ref 7–25)
CO2: 28 mmol/L (ref 20–31)
Calcium: 9.3 mg/dL (ref 8.6–10.4)
Chloride: 107 mmol/L (ref 98–110)
Creat: 1.67 mg/dL — ABNORMAL HIGH (ref 0.60–0.88)
Glucose, Bld: 119 mg/dL — ABNORMAL HIGH (ref 70–99)
Potassium: 4.2 mmol/L (ref 3.5–5.3)
Sodium: 144 mmol/L (ref 135–146)
Total Bilirubin: 0.5 mg/dL (ref 0.2–1.2)
Total Protein: 6.4 g/dL (ref 6.1–8.1)

## 2016-02-02 LAB — LIPID PANEL
Cholesterol: 156 mg/dL (ref <200)
HDL: 59 mg/dL (ref >50)
LDL Cholesterol: 80 mg/dL
Total CHOL/HDL Ratio: 2.6 Ratio (ref <5.0)
Triglycerides: 87 mg/dL (ref <150)
VLDL: 17 mg/dL (ref <30)

## 2016-02-02 LAB — CBC WITH DIFFERENTIAL/PLATELET
Basophils Absolute: 52 cells/uL (ref 0–200)
Basophils Relative: 1 %
Eosinophils Absolute: 208 cells/uL (ref 15–500)
Eosinophils Relative: 4 %
HCT: 37.1 % (ref 35.0–45.0)
Hemoglobin: 11.9 g/dL — ABNORMAL LOW (ref 12.0–15.0)
Lymphocytes Relative: 41 %
Lymphs Abs: 2132 cells/uL (ref 850–3900)
MCH: 31.1 pg (ref 27.0–33.0)
MCHC: 32.1 g/dL (ref 32.0–36.0)
MCV: 96.9 fL (ref 80.0–100.0)
MPV: 11 fL (ref 7.5–12.5)
Monocytes Absolute: 520 cells/uL (ref 200–950)
Monocytes Relative: 10 %
Neutro Abs: 2288 cells/uL (ref 1500–7800)
Neutrophils Relative %: 44 %
Platelets: 208 10*3/uL (ref 140–400)
RBC: 3.83 MIL/uL (ref 3.80–5.10)
RDW: 14 % (ref 11.0–15.0)
WBC: 5.2 10*3/uL (ref 3.8–10.8)

## 2016-02-02 LAB — HEMOGLOBIN A1C
Hgb A1c MFr Bld: 5.7 % — ABNORMAL HIGH (ref <5.7)
Mean Plasma Glucose: 117 mg/dL

## 2016-02-02 MED ORDER — ZOSTER VACCINE LIVE 19400 UNT/0.65ML ~~LOC~~ SUSR: 0.6500 mL | Freq: Once | SUBCUTANEOUS | 0 refills | Status: AC

## 2016-02-02 MED ORDER — AMLODIPINE BESYLATE 10 MG PO TABS: 10.0000 mg | ORAL_TABLET | Freq: Every day | ORAL | 2 refills | Status: DC

## 2016-02-02 MED ORDER — ASPIRIN-DIPYRIDAMOLE ER 25-200 MG PO CP12: ORAL_CAPSULE | ORAL | 2 refills | Status: DC

## 2016-02-02 MED ORDER — LABETALOL HCL 300 MG PO TABS: 300.0000 mg | ORAL_TABLET | Freq: Two times a day (BID) | ORAL | 2 refills | Status: DC

## 2016-02-02 NOTE — Progress Notes (Signed)
   Subjective:    Patient ID: [name], female    DOB: 24-Sep-1930, 82 y.o.   MRN: [id number]  Patient presents for F/U (is not fasting)  Pt here for F/U on chronic medical problems, last seen in March 2017 for routine visit, seen in May after UTI treated by Urgent Care  At last visit, creatinine was rising, concern for worsening CKD, she was referred to nephrology  ( I dont have last note), she also had mildly elevated LFT but had recent viral illness. She is on bicarb from nephrology   Hypertension, she is taking the norvasc and labetalol as prescribed  History of stroke on aggrenox and lipitor   History of anxiety and insomnia- takes ativan as needed   She continues to have problems with her knees the left is worse than the right she is willing to go see orthopedics. She uses a knee brace uses tramadol   Flu shot due    Review Of Systems:  GEN- denies fatigue, fever, weight loss,weakness, recent illness HEENT- denies eye drainage, change in vision, nasal discharge, CVS- denies chest pain, palpitations RESP- denies SOB, cough, wheeze ABD- denies N/V, change in stools, abd pain GU- denies dysuria, hematuria, dribbling, incontinence MSK- + joint pain, muscle aches, injury Neuro- denies headache, dizziness, syncope, seizure activity       Objective:    BP 138/88 (BP Location: Left Arm, Patient Position: Sitting, Cuff Size: Large)   Pulse 60   Temp 98.8 F (37.1 C) (Oral)   Resp 16   Ht 4\' 11"  (1.499 m)   Wt 155 lb (70.3 kg)   SpO2 98%   BMI 31.31 kg/m  GEN- NAD, alert and oriented x3 HEENT- PERRL, EOMI, non injected sclera, pink conjunctiva, MMM, oropharynx clear CVS- RRR, no murmur RESP-CTAB MSK- Decreased ROM bilat knee, small effusion left knee, +crepitus  EXT- ankle edema bilat  Pulses- Radial, DP- 2+        Assessment & Plan:      Shingles vaccine sent to pharmacy  Problem List Items Addressed This Visit    Essential hypertension    Blood  pressure controlled for her age no changes Fasting labs to be done today.  Regards and also arthritis of her knees think she'll benefit from orthopedics. She may need to have some fluid drawn off of the left knee. She just does not one any surgical intervention. She will continue the tramadol no anti-inflammatories because of her chronic kidney disease  Diabetes is diet controlled we'll recheck A1c today  Flu Shot given      Relevant Orders   Lipid panel   Diabetes mellitus, type II ([place]) - Primary   Relevant Orders   CBC with Differential/Platelet   Comprehensive metabolic panel   Hemoglobin A1c   Lipid panel   Chronic kidney disease   Anxiety and depression    Continue ativan at bedtime, no difficulty with medication, no falls          Note: This dictation was prepared with Dragon dictation along with smaller phrase technology. Any transcriptional errors that result from this process are unintentional.

## 2016-02-02 NOTE — Patient Instructions (Signed)
Referral to orthopedics in [facility] Flu shot done Shingles vaccine sent to pharmacy We will call with lab results F/U 4 months

## 2016-02-02 NOTE — Assessment & Plan Note (Signed)
Continue ativan at bedtime, no difficulty with medication, no falls

## 2016-02-02 NOTE — Addendum Note (Signed)
Addended by: Sheral Flow on: 02/02/2016 12:56 PM   Modules accepted: Orders

## 2016-02-02 NOTE — Assessment & Plan Note (Signed)
Blood pressure controlled for her age no changes Fasting labs to be done today.  Regards and also arthritis of her knees think she'll benefit from orthopedics. She may need to have some fluid drawn off of the left knee. She just does not one any surgical intervention. She will continue the tramadol no anti-inflammatories because of her chronic kidney disease  Diabetes is diet controlled we'll recheck A1c today  Flu Shot given

## 2016-02-03 ENCOUNTER — Encounter: Payer: Self-pay | Admitting: *Deleted

## 2016-02-21 ENCOUNTER — Other Ambulatory Visit: Payer: Self-pay | Admitting: Family Medicine

## 2016-02-22 NOTE — Telephone Encounter (Signed)
Okay, give 3

## 2016-02-22 NOTE — Telephone Encounter (Signed)
Ok to refill Tramadol??  Last office visit 02/02/2016.  Last refill 01/18/2016.

## 2016-02-23 NOTE — Telephone Encounter (Signed)
Rx filled Tramadol called in  

## 2016-03-02 ENCOUNTER — Ambulatory Visit (INDEPENDENT_AMBULATORY_CARE_PROVIDER_SITE_OTHER): Payer: Medicare HMO | Admitting: Orthopaedic Surgery

## 2016-03-10 ENCOUNTER — Ambulatory Visit (INDEPENDENT_AMBULATORY_CARE_PROVIDER_SITE_OTHER): Payer: Medicare HMO | Admitting: Orthopaedic Surgery

## 2016-03-10 ENCOUNTER — Encounter (INDEPENDENT_AMBULATORY_CARE_PROVIDER_SITE_OTHER): Payer: Self-pay | Admitting: Orthopaedic Surgery

## 2016-03-10 ENCOUNTER — Ambulatory Visit (INDEPENDENT_AMBULATORY_CARE_PROVIDER_SITE_OTHER): Payer: Medicare HMO

## 2016-03-10 DIAGNOSIS — M1711 Unilateral primary osteoarthritis, right knee: Secondary | ICD-10-CM | POA: Diagnosis not present

## 2016-03-10 DIAGNOSIS — M1712 Unilateral primary osteoarthritis, left knee: Secondary | ICD-10-CM

## 2016-03-10 MED ORDER — METHYLPREDNISOLONE ACETATE 40 MG/ML IJ SUSP
40.0000 mg | INTRAMUSCULAR | Status: AC | PRN
  Administered 2016-03-10: 40 mg via INTRA_ARTICULAR

## 2016-03-10 MED ORDER — BUPIVACAINE HCL 0.5 % IJ SOLN
2.0000 mL | INTRAMUSCULAR | Status: AC | PRN
  Administered 2016-03-10: 2 mL via INTRA_ARTICULAR

## 2016-03-10 MED ORDER — LIDOCAINE HCL 1 % IJ SOLN
2.0000 mL | INTRAMUSCULAR | Status: AC | PRN
  Administered 2016-03-10: 2 mL

## 2016-03-10 NOTE — Progress Notes (Signed)
Office Visit Note   Patient: [name]           Date of Birth: [birth year]           MRN: [id number] Visit Date: 03/10/2016              Requested by: Alycia Rossetti, MD [address] [place], [place] [address] PCP: Vic Blackbird, MD   Assessment & Plan: Visit Diagnoses:  1. Primary osteoarthritis of right knee   2. Unilateral primary osteoarthritis, left knee     Plan: We discussed surgical versus nonsurgical treatment. Patient does not want a knee replacement. Did provide her with bilateral cortisone injections today. I gave her information handout on monovisc injection to think about. Follow-up as needed.  Follow-Up Instructions: Return if symptoms worsen or fail to improve.   Orders:  Orders Placed This Encounter  Procedures  . XR KNEE 3 VIEW RIGHT  . XR KNEE 3 VIEW LEFT   No orders of the defined types were placed in this encounter.     Procedures: Large Joint Inj Date/Time: 03/10/2016 4:45 PM Performed by: Leandrew Koyanagi Authorized by: Leandrew Koyanagi   Consent Given by:  Patient Timeout: prior to procedure the correct patient, procedure, and site was verified   Indications:  Pain Location:  Knee Site:  R knee Prep: patient was prepped and draped in usual sterile fashion   Needle Size:  22 G Ultrasound Guidance: No   Fluoroscopic Guidance: No   Arthrogram: No   Patient tolerance:  Patient tolerated the procedure well with no immediate complications Large Joint Inj Date/Time: 03/10/2016 4:45 PM Performed by: Leandrew Koyanagi Authorized by: Leandrew Koyanagi   Consent Given by:  Patient Timeout: prior to procedure the correct patient, procedure, and site was verified   Indications:  Pain Location:  Knee Site:  R knee Prep: patient was prepped and draped in usual sterile fashion   Needle Size:  22 G Ultrasound Guidance: No   Fluoroscopic Guidance: No   Arthrogram: No   Medications:  2 mL lidocaine 1 %; 2 mL bupivacaine 0.5 %; 40 mg  methylPREDNISolone acetate 40 MG/ML Patient tolerance:  Patient tolerated the procedure well with no immediate complications     Clinical Data: No additional findings.   Subjective: Chief Complaint  Patient presents with  . Right Knee - Pain    [name] is a 82 year old female comes in with bilateral knee pain worse on the left side is been bothering her for many years with recent worsening. She endorses pain cracking and popping numbness and giving way. She has had previous injections with partial relief. She does wear her knee brace and ambulates with a cane. The pain does not radiate.    Review of Systems  Constitutional: Negative.   HENT: Negative.   Eyes: Negative.   Respiratory: Negative.   Cardiovascular: Negative.   Endocrine: Negative.   Musculoskeletal: Negative.   Neurological: Negative.   Hematological: Negative.   Psychiatric/Behavioral: Negative.   All other systems reviewed and are negative.    Objective: Vital Signs: There were no vitals taken for this visit.  Physical Exam  Constitutional: She is oriented to person, place, and time. She appears well-developed and well-nourished.  HENT:  Head: Atraumatic.  Eyes: EOM are normal.  Neck: Neck supple.  Cardiovascular: Intact distal pulses.   Pulmonary/Chest: Effort normal.  Abdominal: Soft.  Neurological: She is alert and oriented to person, place, and time.  Skin: Skin is warm. Capillary refill takes less than 2 seconds.  Psychiatric: She has a normal mood and affect. Her behavior is normal. Judgment and thought content normal.  Nursing note and vitals reviewed.   Ortho Exam Exam of bilateral knee shows valgus deformity. No joint effusion. Collaterals and cruciates are stable. Appropriate range of motion. Specialty Comments:  No specialty comments available.  Imaging: Xr Knee 3 View Left  Result Date: 03/10/2016 Advanced degenerative joint disease with valgus deformity  Xr Knee 3 View  Right  Result Date: 03/10/2016 Advanced degenerative joint disease with valgus deformity    PMFS History: Patient Active Problem List   Diagnosis Date Noted  . Irritation of oral cavity 10/14/2014  . Osteoarthritis 03/25/2014  . Mouth ulceration 03/25/2014  . OA (osteoarthritis) of knee 07/31/2012  . Ingrown nail 04/12/2012  . Anxiety and depression 02/20/2012  . Diabetes mellitus, type II ([place]) 02/20/2012  . Chronic headache 02/20/2012  . Peripheral edema 02/19/2012  . Hyperparathyroidism ([place]) 10/23/2008  . ANEMIA 10/23/2008  . Essential hypertension 10/23/2008  . GALLSTONE PANCREATITIS 10/23/2008  . Chronic kidney disease 10/23/2008  . DEGENERATIVE JOINT DISEASE 10/23/2008  . PROTEINURIA 10/23/2008   Past Medical History:  Diagnosis Date  . Anxiety   . Arthritis   . CKD (chronic kidney disease)    Dr. Hinda Lenis  . Depression   . Diabetes mellitus   . GERD (gastroesophageal reflux disease)   . Hyperlipidemia   . Hypertension   . Osteoporosis   . Stroke ([place])   . UTI (lower urinary tract infection)     Family History  Problem Relation Age of Onset  . Cancer Mother   . Cancer Father     Past Surgical History:  Procedure Laterality Date  . CHOLECYSTECTOMY     Social History   Occupational History  . Not on file.   Social History Main Topics  . Smoking status: Never Smoker  . Smokeless tobacco: Never Used  . Alcohol use No  . Drug use: No  . Sexual activity: No

## 2016-04-26 ENCOUNTER — Other Ambulatory Visit: Payer: Self-pay | Admitting: Family Medicine

## 2016-05-23 ENCOUNTER — Other Ambulatory Visit: Payer: Self-pay | Admitting: Family Medicine

## 2016-06-02 ENCOUNTER — Encounter: Payer: Self-pay | Admitting: Family Medicine

## 2016-06-02 ENCOUNTER — Ambulatory Visit (INDEPENDENT_AMBULATORY_CARE_PROVIDER_SITE_OTHER): Payer: Medicare HMO | Admitting: Family Medicine

## 2016-06-02 VITALS — BP 132/78 | HR 82 | Temp 98.1°F | Resp 18 | Ht 59.0 in | Wt 157.0 lb

## 2016-06-02 DIAGNOSIS — M8949 Other hypertrophic osteoarthropathy, multiple sites: Secondary | ICD-10-CM

## 2016-06-02 DIAGNOSIS — R609 Edema, unspecified: Secondary | ICD-10-CM | POA: Diagnosis not present

## 2016-06-02 DIAGNOSIS — Z8673 Personal history of transient ischemic attack (TIA), and cerebral infarction without residual deficits: Secondary | ICD-10-CM | POA: Insufficient documentation

## 2016-06-02 DIAGNOSIS — N183 Chronic kidney disease, stage 3 unspecified: Secondary | ICD-10-CM

## 2016-06-02 DIAGNOSIS — M159 Polyosteoarthritis, unspecified: Secondary | ICD-10-CM

## 2016-06-02 DIAGNOSIS — I10 Essential (primary) hypertension: Secondary | ICD-10-CM

## 2016-06-02 DIAGNOSIS — E119 Type 2 diabetes mellitus without complications: Secondary | ICD-10-CM

## 2016-06-02 DIAGNOSIS — M150 Primary generalized (osteo)arthritis: Secondary | ICD-10-CM

## 2016-06-02 MED ORDER — DICLOFENAC SODIUM 1 % TD GEL: TRANSDERMAL | 6 refills | Status: DC

## 2016-06-02 MED ORDER — FLUTICASONE PROPIONATE 50 MCG/ACT NA SUSP: 2.0000 | Freq: Every day | NASAL | 2 refills | Status: DC | PRN

## 2016-06-02 MED ORDER — ASPIRIN-DIPYRIDAMOLE ER 25-200 MG PO CP12: ORAL_CAPSULE | ORAL | 2 refills | Status: DC

## 2016-06-02 MED ORDER — ATORVASTATIN CALCIUM 20 MG PO TABS: 20.0000 mg | ORAL_TABLET | Freq: Every day | ORAL | 2 refills | Status: DC

## 2016-06-02 NOTE — Patient Instructions (Addendum)
F/U 4 months for Physical  

## 2016-06-02 NOTE — Progress Notes (Signed)
   Subjective:    Patient ID: [name], female    DOB: 02/15/1931, 83 y.o.   MRN: [id number]  Patient presents for F/U (is not fasting)   Pt here to f/u chronic medical problems.She continues to have difficulties with her knees. She was seen by orthopedic she had 2 cortisone injections in both knees in December they lasted until now. She has not was surgical intervention therefore they're considering gel injections. She does take tramadol uses Voltaren gel for her knees.  Hypertension she is taking her medicines as prescribed but did review all of her meds she is out of her Aggrenox. She had 3 different bottles of Lipitor however only one was-atorvastatin 1 Caryl Pina a baby aspirin and in another had broken up pieces of blood pressure medication.  She does continue to use lorazepam as needed for sleep she has not had any falls.   Review Of Systems:  GEN- denies fatigue, fever, weight loss,weakness, recent illness HEENT- denies eye drainage, change in vision, nasal discharge, CVS- denies chest pain, palpitations RESP- denies SOB, cough, wheeze ABD- denies N/V, change in stools, abd pain GU- denies dysuria, hematuria, dribbling, incontinence MSK- +oint pain, muscle aches, injury Neuro- denies headache, dizziness, syncope, seizure activity       Objective:    BP 132/78   Pulse 82   Temp 98.1 F (36.7 C) (Oral)   Resp 18   Ht 4\' 11"  (1.499 m)   Wt 157 lb (71.2 kg)   SpO2 99%   BMI 31.71 kg/m  GEN- NAD, alert and oriented x3 HEENT- PERRL, EOMI, non injected sclera, pink conjunctiva, MMM, oropharynx clear CVS- RRR, no murmur RESP-CTAB ABD-NABS, soft,NT,ND  EXT- chronic ankle edema bilat  Pulses- Radial, DP- 2+        Assessment & Plan:      Problem List Items Addressed This Visit    Peripheral edema    Chronic ankle edema      Osteoarthritis   History of stroke   Essential hypertension - Primary    Blood pressures well controlled. We'll restart her Aggrenox.  Her blood sugars also diet-controlled A1c was last at 5.7%. Recheck her fasting labs at the next visit in 4 months. Regards to her osteoarthritis she will continue the tramadol and the Voltaren gel. Discussed with her and her son that she can call orthopedics at any time if she wants to try the gel injections. In general I think she is doing well at home.      Relevant Medications   atorvastatin (LIPITOR) 20 MG tablet   Diabetes mellitus, type II (HCC)   Relevant Medications   atorvastatin (LIPITOR) 20 MG tablet   Chronic kidney disease    Maintained on bicarb Follows with nephrology         Note: This dictation was prepared with Dragon dictation along with smaller phrase technology. Any transcriptional errors that result from this process are unintentional.

## 2016-06-02 NOTE — Assessment & Plan Note (Signed)
Blood pressures well controlled. We'll restart her Aggrenox. Her blood sugars also diet-controlled A1c was last at 5.7%. Recheck her fasting labs at the next visit in 4 months. Regards to her osteoarthritis she will continue the tramadol and the Voltaren gel. Discussed with her and her son that she can call orthopedics at any time if she wants to try the gel injections. In general I think she is doing well at home.

## 2016-06-02 NOTE — Assessment & Plan Note (Signed)
Maintained on bicarb Follows with nephrology

## 2016-06-02 NOTE — Assessment & Plan Note (Signed)
Chronic ankle edema

## 2016-06-10 DIAGNOSIS — M13161 Monoarthritis, not elsewhere classified, right knee: Secondary | ICD-10-CM | POA: Diagnosis not present

## 2016-06-10 DIAGNOSIS — K219 Gastro-esophageal reflux disease without esophagitis: Secondary | ICD-10-CM | POA: Diagnosis not present

## 2016-06-10 DIAGNOSIS — R292 Abnormal reflex: Secondary | ICD-10-CM | POA: Diagnosis not present

## 2016-06-10 DIAGNOSIS — I10 Essential (primary) hypertension: Secondary | ICD-10-CM | POA: Diagnosis not present

## 2016-06-10 DIAGNOSIS — M13162 Monoarthritis, not elsewhere classified, left knee: Secondary | ICD-10-CM | POA: Diagnosis not present

## 2016-06-10 DIAGNOSIS — Z6828 Body mass index (BMI) 28.0-28.9, adult: Secondary | ICD-10-CM | POA: Diagnosis not present

## 2016-06-10 DIAGNOSIS — R69 Illness, unspecified: Secondary | ICD-10-CM | POA: Diagnosis not present

## 2016-06-10 DIAGNOSIS — R29898 Other symptoms and signs involving the musculoskeletal system: Secondary | ICD-10-CM | POA: Diagnosis not present

## 2016-06-10 DIAGNOSIS — Z0000 Encounter for general adult medical examination without abnormal findings: Secondary | ICD-10-CM | POA: Diagnosis not present

## 2016-06-10 DIAGNOSIS — E785 Hyperlipidemia, unspecified: Secondary | ICD-10-CM | POA: Diagnosis not present

## 2016-07-16 ENCOUNTER — Other Ambulatory Visit: Payer: Self-pay | Admitting: Family Medicine

## 2016-07-17 NOTE — Telephone Encounter (Signed)
Medication called to pharmacy. 

## 2016-07-17 NOTE — Telephone Encounter (Signed)
okay

## 2016-07-17 NOTE — Telephone Encounter (Signed)
Ok to refill Tramadol??  Last office visit 06/02/2016.  Last refill 02/23/2016, #3 refills.

## 2016-08-09 ENCOUNTER — Other Ambulatory Visit: Payer: Self-pay | Admitting: Family Medicine

## 2016-09-10 ENCOUNTER — Other Ambulatory Visit: Payer: Self-pay | Admitting: Family Medicine

## 2016-10-16 ENCOUNTER — Other Ambulatory Visit: Payer: Self-pay | Admitting: Family Medicine

## 2016-10-16 ENCOUNTER — Encounter: Payer: Medicare HMO | Admitting: Family Medicine

## 2016-10-16 NOTE — Telephone Encounter (Signed)
Call family she missed her appt today, this needs to be rescheduled Okay to refill

## 2016-10-16 NOTE — Telephone Encounter (Signed)
Ok to refill??  Last office visit 06/02/2016.  Last refill 08/24/2015, #3 refills.

## 2016-10-16 NOTE — Progress Notes (Deleted)
Subjective:   Patient presents for Medicare Annual/Subsequent preventive examination.  Patient here for annual wellness medications reviewed. She is still being followed by nephrology for her chronic kidney disease.  Diabetes mellitus diet controlled last A1c 5.7% she is due for recheck today as well as a lipid panel. She is on Aggrenox secondary to history of stroke Review Past Medical/Family/Social: Per EMR   Risk Factors  Current exercise habits: None Dietary issues discussed: yes, discussed limiting sweets  Cardiac risk factors: Obesity (BMI >= 30 kg/m2). History of Stroke   Depression Screen  (Note: if answer to either of the following is "Yes", a more complete depression screening is indicated)  Over the past two weeks, have you felt down, depressed or hopeless? No Over the past two weeks, have you felt little interest or pleasure in doing things? No Have you lost interest or pleasure in daily life? No Do you often feel hopeless? No Do you cry easily over simple problems? No   Activities of Daily Living  In your present state of health, do you have any difficulty performing the following activities?:  Driving? No  Managing money? No  Feeding yourself? No  Getting from bed to chair? No  Climbing a flight of stairs? No  Preparing food and eating?: No  Bathing or showering? No  Getting dressed: No  Getting to the toilet? No  Using the toilet:No  Moving around from place to place: No  In the past year have you fallen or had a near fall?:No  Are you sexually active? No  Do you have more than one partner? No   Hearing Difficulties: No  Do you often ask people to speak up or repeat themselves? No  Do you experience ringing or noises in your ears? No Do you have difficulty understanding soft or whispered voices? No  Do you feel that you have a problem with memory? No Do you often misplace items? No  Do you feel safe at home? Yes  Cognitive Testing  Alert? Yes Normal  Appearance?Yes  Oriented to person? Yes Place? Yes  Time? Yes  Recall of three objects? Yes  Can perform simple calculations? Yes  Displays appropriate judgment?Yes  Can read the correct time from a watch face?Yes   List the Names of Other Physician/Practitioners you currently use:  Nephrology, Orthopedics     Screening Tests / Date Colonoscopy      Over max age              Zostavax Discussed  Mammogram  Over recommended age  Influenza Vaccine  UTD Pneumonia- UTD  ROS: GEN- denies fatigue, fever, weight loss,weakness, recent illness HEENT- denies eye drainage, change in vision, nasal discharge, CVS- denies chest pain, palpitations RESP- denies SOB, cough, wheeze ABD- denies N/V, change in stools, abd pain GU- denies dysuria, hematuria, dribbling, incontinence MSK- denies joint pain, muscle aches, injury Neuro- denies headache, dizziness, syncope, seizure activity  Physical: GEN- NAD, alert and oriented x3 HEENT- PERRL, EOMI, non injected sclera, pink conjunctiva, MMM, oropharynx clear Neck- Supple, no thryomegaly CVS- RRR, no murmur RESP-CTAB ABD-NABS,soft,,NT,ND EXT- chronic ankle edema bilat  Pulses- Radial, DP- 2+     Assessment:    Annual wellness medicare exam   Plan:    During the course of the visit the patient was educated and counseled about appropriate screening and preventive services including:  Shingles vaccine. Prescription given to that she can get the vaccine at the pharmacy or Medicare part D.  Screen Neg  for depression.   Discussed end of Life care/advanced directives   DM- diet controlled, A1C and lipid obtained, known CKD followed by nephrology so urine micro not done today   HTN- controlled no changes   Discussed eye exam     Patient Instructions (the written plan) was given to the patient.  Medicare Attestation  I have personally reviewed:  The patient's medical and social history  Their use of alcohol, tobacco or illicit drugs   Their current medications and supplements  The patient's functional ability including ADLs,fall risks, home safety risks, cognitive, and hearing and visual impairment  Diet and physical activities  Evidence for depression or mood disorders  The patient's weight, height, BMI, and visual acuity have been recorded in the chart. I have made referrals, counseling, and provided education to the patient based on review of the above and I have provided the patient with a written personalized care plan for preventive services.

## 2016-10-17 NOTE — Telephone Encounter (Signed)
Medication called to pharmacy.  Call placed to patient daughter. Appointment re- scheduled.

## 2016-10-23 ENCOUNTER — Encounter: Payer: Self-pay | Admitting: Family Medicine

## 2016-10-25 ENCOUNTER — Ambulatory Visit (INDEPENDENT_AMBULATORY_CARE_PROVIDER_SITE_OTHER): Payer: Medicare HMO | Admitting: Family Medicine

## 2016-10-25 ENCOUNTER — Other Ambulatory Visit: Payer: Self-pay | Admitting: Family Medicine

## 2016-10-25 ENCOUNTER — Encounter: Payer: Self-pay | Admitting: Family Medicine

## 2016-10-25 VITALS — BP 130/76 | HR 56 | Temp 98.3°F | Resp 18 | Ht <= 58 in | Wt 153.0 lb

## 2016-10-25 DIAGNOSIS — I10 Essential (primary) hypertension: Secondary | ICD-10-CM | POA: Diagnosis not present

## 2016-10-25 DIAGNOSIS — Z0000 Encounter for general adult medical examination without abnormal findings: Secondary | ICD-10-CM | POA: Diagnosis not present

## 2016-10-25 DIAGNOSIS — N183 Chronic kidney disease, stage 3 unspecified: Secondary | ICD-10-CM

## 2016-10-25 DIAGNOSIS — E119 Type 2 diabetes mellitus without complications: Secondary | ICD-10-CM

## 2016-10-25 LAB — CBC WITH DIFFERENTIAL/PLATELET
Basophils Absolute: 62 cells/uL (ref 0–200)
Basophils Relative: 1 %
Eosinophils Absolute: 186 cells/uL (ref 15–500)
Eosinophils Relative: 3 %
HCT: 37.1 % (ref 35.0–45.0)
Hemoglobin: 11.8 g/dL — ABNORMAL LOW (ref 12.0–15.0)
Lymphocytes Relative: 38 %
Lymphs Abs: 2356 cells/uL (ref 850–3900)
MCH: 31.3 pg (ref 27.0–33.0)
MCHC: 31.8 g/dL — ABNORMAL LOW (ref 32.0–36.0)
MCV: 98.4 fL (ref 80.0–100.0)
MPV: 11.2 fL (ref 7.5–12.5)
Monocytes Absolute: 682 cells/uL (ref 200–950)
Monocytes Relative: 11 %
Neutro Abs: 2914 cells/uL (ref 1500–7800)
Neutrophils Relative %: 47 %
Platelets: 242 10*3/uL (ref 140–400)
RBC: 3.77 MIL/uL — ABNORMAL LOW (ref 3.80–5.10)
RDW: 14.2 % (ref 11.0–15.0)
WBC: 6.2 10*3/uL (ref 3.8–10.8)

## 2016-10-25 MED ORDER — DICLOFENAC SODIUM 1 % TD GEL: TRANSDERMAL | 6 refills | Status: DC

## 2016-10-25 MED ORDER — LABETALOL HCL 300 MG PO TABS: 300.0000 mg | ORAL_TABLET | Freq: Two times a day (BID) | ORAL | 2 refills | Status: DC

## 2016-10-25 MED ORDER — SODIUM BICARBONATE 650 MG PO TABS: 1300.0000 mg | ORAL_TABLET | Freq: Three times a day (TID) | ORAL | 2 refills | Status: DC

## 2016-10-25 MED ORDER — OMEPRAZOLE 20 MG PO CPDR: 20.0000 mg | DELAYED_RELEASE_CAPSULE | Freq: Every day | ORAL | 2 refills | Status: DC

## 2016-10-25 MED ORDER — AMLODIPINE BESYLATE 10 MG PO TABS: 10.0000 mg | ORAL_TABLET | Freq: Every day | ORAL | 2 refills | Status: DC

## 2016-10-25 MED ORDER — TRAMADOL HCL 50 MG PO TABS: 50.0000 mg | ORAL_TABLET | Freq: Two times a day (BID) | ORAL | 3 refills | Status: DC | PRN

## 2016-10-25 NOTE — Patient Instructions (Addendum)
Call [facility] orthopedics Dr. Erlinda Hong for a shot in your knees [phone] We will check on kidney doctor appointment  We will call with labs Find out about the eye doctor F/U 4 months

## 2016-10-25 NOTE — Progress Notes (Signed)
Subjective:   Patient presents for Medicare Annual/Subsequent preventive examination.  Patient here for annual wellness exam. Medications reviewed  Due for fasting labs  DM- Diet controlled, last A1C 5.7 in Nov 2017 , LDL 80  Knee has been bothering her, wants another shot from orthopedics  Not sure when she was last at kidney doctor   Feels good, gets tired at times and needs to rest   Review Past Medical/Family/Social: Per EMR    Risk Factors  Current exercise habits: none  Dietary issues discussed: YES  Cardiac risk factors: Obesity (BMI >= 30 kg/m2). HTN, CVA   Depression Screen  (Note: if answer to either of the following is "Yes", a more complete depression screening is indicated)  Over the past two weeks, have you felt down, depressed or hopeless? No Over the past two weeks, have you felt little interest or pleasure in doing things? No Have you lost interest or pleasure in daily life? No Do you often feel hopeless? No Do you cry easily over simple problems? No   Activities of Daily Living  In your present state of health, do you have any difficulty performing the following activities?:  Driving? yes Managing money? No  Feeding yourself? No  Getting from bed to chair? No  Climbing a flight of stairs? No  Preparing food and eating?: No  Bathing or showering? No  Getting dressed: No  Getting to the toilet? No  Using the toilet:No  Moving around from place to place: yes In the past year have you fallen or had a near fall?:No  Are you sexually active? No  Do you have more than one partner? No   Hearing Difficulties:  Do you often ask people to speak up or repeat themselves? No  Do you experience ringing or noises in your ears? No Do you have difficulty understanding soft or whispered voices? yes Do you feel that you have a problem with memory? No Do you often misplace items? No  Do you feel safe at home? Yes  Cognitive Testing  Alert? Yes Normal Appearance?Yes   Oriented to person? Yes Place? Yes  Time? Yes  Recall of three objects? Yes  Can perform simple calculations? Yes  Displays appropriate judgment?Yes  Can read the correct time from a watch face?Yes   List the Names of Other Physician/Practitioners you currently use: Nephrology, orthopedics    Screening Tests / Date             Zostavax  Mammogram over age  Pneumonia- UTD  Influenza Vaccine  UTD Tetanus/tdap not covered   GEN- denies fatigue, fever, weight loss,weakness, recent illness HEENT- denies eye drainage, change in vision, nasal discharge, CVS- denies chest pain, palpitations RESP- denies SOB, cough, wheeze ABD- denies N/V, change in stools, abd pain GU- denies dysuria, hematuria, dribbling, incontinence MSK- + joint pain, muscle aches, injury Neuro- denies headache, dizziness, syncope, seizure activity  GEN- NAD, alert and oriented x3 HEENT- PERRL, EOMI, non injected sclera, pink conjunctiva, MMM, oropharynx clear Neck- Supple, no thryomegaly, no bruit CVS- RRR, no murmur RESP-CTAB ABD-NABS,soft,NT,ND Pscyh- normal affect and mood EXT- pedal edema Pulses- Radial, DP- 2+     Assessment:    Annual wellness medicare exam   Plan:    During the course of the visit the patient was educated and counseled about appropriate screening and preventive services including:  Declines shingles vaccine  ScreenNEG for depression.  Fasting labs to be done CKD- We will call and arrange f/u with kidney  doctor and send them labs Pt son/daughter to call Dr. Erlinda Hong for another injection, number provided, last seen in December  Copy of advanced directives given to patient and explained  Medications refilled, Ultram for her OA of knee until seen by ortho  HTN- blood pressure controlled, recommend she wear her compression hose for chronic edema  DM- diet controlled, check labs    Diet review for nutrition referral? Yes ____ Not Indicated __x__  Patient Instructions (the  written plan) was given to the patient.  Medicare Attestation  I have personally reviewed:  The patient's medical and social history  Their use of alcohol, tobacco or illicit drugs  Their current medications and supplements  The patient's functional ability including ADLs,fall risks, home safety risks, cognitive, and hearing and visual impairment  Diet and physical activities  Evidence for depression or mood disorders  The patient's weight, height, BMI, and visual acuity have been recorded in the chart. I have made referrals, counseling, and provided education to the patient based on review of the above and I have provided the patient with a written personalized care plan for preventive services.

## 2016-10-26 LAB — LIPID PANEL
Cholesterol: 148 mg/dL (ref <200)
HDL: 57 mg/dL (ref >50)
LDL Cholesterol: 66 mg/dL (ref <100)
Total CHOL/HDL Ratio: 2.6 Ratio (ref <5.0)
Triglycerides: 127 mg/dL (ref <150)
VLDL: 25 mg/dL (ref <30)

## 2016-10-28 LAB — COMPLETE METABOLIC PANEL WITH GFR
ALT: 17 U/L (ref 6–29)
AST: 24 U/L (ref 10–35)
Albumin: 3.8 g/dL (ref 3.6–5.1)
Alkaline Phosphatase: 71 U/L (ref 33–130)
BUN: 30 mg/dL — ABNORMAL HIGH (ref 7–25)
CO2: 22 mmol/L (ref 20–31)
Calcium: 9.5 mg/dL (ref 8.6–10.4)
Chloride: 108 mmol/L (ref 98–110)
Creat: 1.83 mg/dL — ABNORMAL HIGH (ref 0.60–0.88)
GFR, Est African American: 29 mL/min — ABNORMAL LOW (ref >=60)
GFR, Est Non African American: 25 mL/min — ABNORMAL LOW (ref >=60)
Glucose, Bld: 93 mg/dL (ref 70–99)
Potassium: 4.6 mmol/L (ref 3.5–5.3)
Sodium: 145 mmol/L (ref 135–146)
Total Bilirubin: 0.3 mg/dL (ref 0.2–1.2)
Total Protein: 6.4 g/dL (ref 6.1–8.1)

## 2016-11-09 ENCOUNTER — Encounter (INDEPENDENT_AMBULATORY_CARE_PROVIDER_SITE_OTHER): Payer: Self-pay | Admitting: Orthopedic Surgery

## 2016-11-09 ENCOUNTER — Ambulatory Visit (INDEPENDENT_AMBULATORY_CARE_PROVIDER_SITE_OTHER): Payer: Medicare HMO | Admitting: Orthopedic Surgery

## 2016-11-09 DIAGNOSIS — M1712 Unilateral primary osteoarthritis, left knee: Secondary | ICD-10-CM | POA: Diagnosis not present

## 2016-11-09 DIAGNOSIS — M1711 Unilateral primary osteoarthritis, right knee: Secondary | ICD-10-CM | POA: Diagnosis not present

## 2016-11-09 DIAGNOSIS — M170 Bilateral primary osteoarthritis of knee: Secondary | ICD-10-CM | POA: Insufficient documentation

## 2016-11-09 MED ORDER — LIDOCAINE HCL 1 % IJ SOLN
5.0000 mL | INTRAMUSCULAR | Status: AC | PRN
  Administered 2016-11-09: 5 mL

## 2016-11-09 MED ORDER — METHYLPREDNISOLONE ACETATE 40 MG/ML IJ SUSP
40.0000 mg | INTRAMUSCULAR | Status: AC | PRN
  Administered 2016-11-09: 40 mg via INTRA_ARTICULAR

## 2016-11-09 MED ORDER — METHYLPREDNISOLONE ACETATE 40 MG/ML IJ SUSP
40.0000 mg | INTRAMUSCULAR | Status: AC | PRN
Start: 2016-11-09 — End: 2016-11-09
  Administered 2016-11-09: 40 mg via INTRA_ARTICULAR

## 2016-11-09 NOTE — Progress Notes (Signed)
Office Visit Note   Patient: [name]           Date of Birth: 03-11-1931           MRN: [id number] Visit Date: 11/09/2016              Requested by: Alycia Rossetti, MD [address] [place], [place] [address] PCP: Alycia Rossetti, MD  Chief Complaint  Patient presents with  . Right Knee - Pain, Follow-up  . Left Knee - Pain, Follow-up      HPI: Patient is an 83 year old woman with tricompartmental osteoarthritis both knees with valgus alignment with bone-on-bone contact. Patient states that the previous injection in December 2017 helped a lot. Patient complains of increasing pain in both knees left worse than right she complains of popping. She states the pain is worse with weightbearing she is questioning with best treatment for her knees.  Assessment & Plan: Visit Diagnoses:  1. Bilateral primary osteoarthritis of knee     Plan: Both knees were injected with Depo-Medrol. She tolerated this well discussed options from total knee arthroplasty to hyaluronic acid injection. Discussed postoperative course. Patient states she is not ready for surgery at this time. Discussed that she can follow up every 3 months for a steroid injection.  Follow-Up Instructions: Return if symptoms worsen or fail to improve.   Ortho Exam  Patient is alert, oriented, no adenopathy, well-dressed, normal affect, normal respiratory effort. Examination patient has an antalgic gait. She has valgus alignment worse in the left knee than the right knee. She has crepitation with range of motion of both knees Clausen cruciate are stable bilaterally. She is tender to palpation patellofemoral joint as well as medial and lateral joint lines. Her radiographs were reviewed which shows a severe tricompartmental osteoarthritis of the left greater than right knee with valgus alignment worse on the left than the right.  Imaging: No results found. No images are attached to the encounter.  Labs: Lab  Results  Component Value Date   HGBA1C 5.7 (H) 02/02/2016   HGBA1C 6.1 (H) 06/01/2015   HGBA1C 6.1 (H) 12/22/2014   ESRSEDRATE 78 (H) 10/04/2007   REPTSTATUS 08/15/2015 FINAL 08/13/2015   CULT MULTIPLE SPECIES PRESENT, SUGGEST RECOLLECTION (A) 08/13/2015   LABORGA KLEBSIELLA PNEUMONIAE 06/16/2013    Orders:  No orders of the defined types were placed in this encounter.  No orders of the defined types were placed in this encounter.    Procedures: Large Joint Inj Date/Time: 11/09/2016 3:20 PM Performed by: Derin Matthes V Authorized by: Newt Minion   Consent Given by:  Patient Site marked: the procedure site was marked   Timeout: prior to procedure the correct patient, procedure, and site was verified   Indications:  Pain and diagnostic evaluation Location:  Knee Site:  R knee Prep: patient was prepped and draped in usual sterile fashion   Needle Size:  22 G Needle Length:  1.5 inches Approach:  Anteromedial Ultrasound Guidance: No   Fluoroscopic Guidance: No   Arthrogram: No   Medications:  5 mL lidocaine 1 %; 40 mg methylPREDNISolone acetate 40 MG/ML Aspiration Attempted: No   Patient tolerance:  Patient tolerated the procedure well with no immediate complications Large Joint Inj Date/Time: 11/09/2016 3:20 PM Performed by: Cyncere Ruhe V Authorized by: Meridee Score V   Consent Given by:  Patient Site marked: the procedure site was marked   Timeout: prior to procedure the correct patient, procedure, and site  was verified   Indications:  Pain and diagnostic evaluation Location:  Knee Site:  L knee Prep: patient was prepped and draped in usual sterile fashion   Needle Size:  22 G Needle Length:  1.5 inches Approach:  Anteromedial Ultrasound Guidance: No   Fluoroscopic Guidance: No   Arthrogram: No   Medications:  5 mL lidocaine 1 %; 40 mg methylPREDNISolone acetate 40 MG/ML Aspiration Attempted: No   Patient tolerance:  Patient tolerated the procedure well with  no immediate complications    Clinical Data: No additional findings.  ROS:  All other systems negative, except as noted in the HPI. Review of Systems  Objective: Vital Signs: There were no vitals taken for this visit.  Specialty Comments:  No specialty comments available.  PMFS History: Patient Active Problem List   Diagnosis Date Noted  . Bilateral primary osteoarthritis of knee 11/09/2016  . History of stroke 06/02/2016  . Osteoarthritis 03/25/2014  . Anxiety and depression 02/20/2012  . Diabetes mellitus, type II ([place]) 02/20/2012  . Chronic headache 02/20/2012  . Peripheral edema 02/19/2012  . Hyperparathyroidism ([place]) 10/23/2008  . ANEMIA 10/23/2008  . Essential hypertension 10/23/2008  . GALLSTONE PANCREATITIS 10/23/2008  . Chronic kidney disease 10/23/2008  . DEGENERATIVE JOINT DISEASE 10/23/2008  . PROTEINURIA 10/23/2008   Past Medical History:  Diagnosis Date  . Anxiety   . Arthritis   . CKD (chronic kidney disease)    Dr. Hinda Lenis  . Depression   . Diabetes mellitus   . GERD (gastroesophageal reflux disease)   . Hyperlipidemia   . Hypertension   . Osteoporosis   . Stroke ([place])   . UTI (lower urinary tract infection)     Family History  Problem Relation Age of Onset  . Cancer Mother   . Cancer Father     Past Surgical History:  Procedure Laterality Date  . CHOLECYSTECTOMY     Social History   Occupational History  . Not on file.   Social History Main Topics  . Smoking status: Never Smoker  . Smokeless tobacco: Never Used  . Alcohol use No  . Drug use: No  . Sexual activity: No

## 2016-12-04 DIAGNOSIS — N183 Chronic kidney disease, stage 3 (moderate): Secondary | ICD-10-CM | POA: Diagnosis not present

## 2016-12-04 DIAGNOSIS — E559 Vitamin D deficiency, unspecified: Secondary | ICD-10-CM | POA: Diagnosis not present

## 2016-12-04 DIAGNOSIS — D509 Iron deficiency anemia, unspecified: Secondary | ICD-10-CM | POA: Diagnosis not present

## 2016-12-04 DIAGNOSIS — R809 Proteinuria, unspecified: Secondary | ICD-10-CM | POA: Diagnosis not present

## 2016-12-04 DIAGNOSIS — Z79899 Other long term (current) drug therapy: Secondary | ICD-10-CM | POA: Diagnosis not present

## 2016-12-04 DIAGNOSIS — I10 Essential (primary) hypertension: Secondary | ICD-10-CM | POA: Diagnosis not present

## 2016-12-06 DIAGNOSIS — I10 Essential (primary) hypertension: Secondary | ICD-10-CM | POA: Diagnosis not present

## 2016-12-06 DIAGNOSIS — E1129 Type 2 diabetes mellitus with other diabetic kidney complication: Secondary | ICD-10-CM | POA: Diagnosis not present

## 2016-12-06 DIAGNOSIS — D649 Anemia, unspecified: Secondary | ICD-10-CM | POA: Diagnosis not present

## 2016-12-06 DIAGNOSIS — N184 Chronic kidney disease, stage 4 (severe): Secondary | ICD-10-CM | POA: Diagnosis not present

## 2017-01-17 ENCOUNTER — Ambulatory Visit (INDEPENDENT_AMBULATORY_CARE_PROVIDER_SITE_OTHER): Payer: Medicare HMO | Admitting: Family Medicine

## 2017-01-17 DIAGNOSIS — Z23 Encounter for immunization: Secondary | ICD-10-CM

## 2017-03-09 ENCOUNTER — Ambulatory Visit (INDEPENDENT_AMBULATORY_CARE_PROVIDER_SITE_OTHER): Payer: Medicare HMO | Admitting: Family Medicine

## 2017-03-09 ENCOUNTER — Other Ambulatory Visit: Payer: Self-pay

## 2017-03-09 ENCOUNTER — Encounter: Payer: Self-pay | Admitting: Family Medicine

## 2017-03-09 VITALS — BP 118/68 | HR 70 | Temp 98.2°F | Resp 16 | Ht <= 58 in | Wt 157.0 lb

## 2017-03-09 DIAGNOSIS — R42 Dizziness and giddiness: Secondary | ICD-10-CM

## 2017-03-09 DIAGNOSIS — N183 Chronic kidney disease, stage 3 unspecified: Secondary | ICD-10-CM

## 2017-03-09 DIAGNOSIS — D631 Anemia in chronic kidney disease: Secondary | ICD-10-CM | POA: Diagnosis not present

## 2017-03-09 LAB — CBC
HCT: 38.6 % (ref 35.0–45.0)
Hemoglobin: 12.3 g/dL (ref 11.7–15.5)
MCH: 32 pg (ref 27.0–33.0)
MCHC: 31.9 g/dL — ABNORMAL LOW (ref 32.0–36.0)
MCV: 100.5 fL — ABNORMAL HIGH (ref 80.0–100.0)
Platelets: 214 10*3/uL (ref 140–400)
RBC: 3.84 10*6/uL (ref 3.80–5.10)
RDW: 13.9 % (ref 11.0–15.0)
WBC: 6.2 10*3/uL (ref 3.8–10.8)

## 2017-03-09 LAB — BASIC METABOLIC PANEL
BUN/Creatinine Ratio: 16 (calc) (ref 6–22)
BUN: 29 mg/dL — ABNORMAL HIGH (ref 7–25)
CO2: 28 mmol/L (ref 20–32)
Calcium: 9 mg/dL (ref 8.6–10.4)
Chloride: 107 mmol/L (ref 98–110)
Creat: 1.81 mg/dL — ABNORMAL HIGH (ref 0.60–0.88)
Glucose, Bld: 96 mg/dL (ref 65–99)
Potassium: 4.5 mmol/L (ref 3.5–5.3)
Sodium: 142 mmol/L (ref 135–146)

## 2017-03-09 MED ORDER — MECLIZINE HCL 12.5 MG PO TABS: 12.5000 mg | ORAL_TABLET | Freq: Three times a day (TID) | ORAL | 0 refills | Status: DC | PRN

## 2017-03-09 NOTE — Progress Notes (Signed)
   Subjective:    Patient ID: [name], female    DOB: November 05, 1930, 83 y.o.   MRN: [id number]  Patient presents for Dizziness (x1 week- weakness and dizziness- also reports loss of appetite)   Pt here with dizziness for past week, head feels swimmy when she stands up or bends over.   Has not had any falls . Symptoms started last this Thursday, had a little congestion. She has had some headache with it. No current cough, no fever.  No falls  States appetite has been down a little but weight up 4lbs   No chest pain or SOB   Newest medication from nephrology Jan 23, 2018- iron tablets once a day, and told to stop the vitamin D    Review Of Systems:  GEN- denies fatigue, fever, weight loss,weakness, recent illness HEENT- denies eye drainage, change in vision, nasal discharge, CVS- denies chest pain, palpitations RESP- denies SOB, cough, wheeze ABD- denies N/V, change in stools, abd pain GU- denies dysuria, hematuria, dribbling, incontinence MSK- denies joint pain, muscle aches, injury Neuro- denies headache, +dizziness, syncope, seizure activity       Objective:    BP 118/68   Pulse 70   Temp 98.2 F (36.8 C) (Oral)   Resp 16   Ht 4\' 9"  (1.448 m)   Wt 157 lb (71.2 kg)   SpO2 98%   BMI 33.97 kg/m  GEN- NAD, alert and oriented x3 HEENT- PERRL, EOMI, non injected sclera, pink conjunctiva, MMM, oropharynx clear, nares clear TM clear bilat no effusion  Neck- Supple, no thyromegaly, no bruit  CVS- RRR, no murmur RESP-CTAB ABD-NABS,soft,NT,ND Neuro- no focal deficits, no nystagmus, neg rhomberg, normal speech , no ataxia EXT- No edema Pulses- Radial  2+   Orthostatics 118/82 lying Sitting 118/68 s tanding 122/62      Assessment & Plan:      Problem List Items Addressed This Visit      Unprioritized   ANEMIA - Primary   Relevant Orders   CBC (Completed)    Other Visit Diagnoses    Vertigo       Will treat for Vertigo, no orthostatis on BP but looking back  BP lower than her typical, so will hold the norvasc. Start meclizine, family member with her today.  We will give her meclizine this afternoon and watch her for any side effects.  With regards to the anemia treated by her nephrologist white blood cells were normal and the CBC in the office hemoglobin was stable at 12.3.  Her metabolic was sent out stat will follow this up this afternoon.  Advised him to call for any change in symptoms such as signs of infection vomiting falls.  Follow-up with her on Monday and see how she is doing.  And see when we need to restart the amlodipine   Relevant Orders   Basic metabolic panel      Note: This dictation was prepared with Dragon dictation along with smaller phrase technology. Any transcriptional errors that result from this process are unintentional.

## 2017-03-09 NOTE — Patient Instructions (Addendum)
Blood counts were normal No sign of infection Dont take the norvasc ( Amlodipine 10mg  )  Continue the other medications Take meclizine as needed for the dizziness We will check on her Monday  F/U as previous

## 2017-03-13 ENCOUNTER — Telehealth: Payer: Self-pay | Admitting: *Deleted

## 2017-03-13 NOTE — Telephone Encounter (Signed)
Multiple calls placed to patient on Monday with no answer.   Call placed to patient today. States that she is much improved. Reports that she does still have intermittent episodes where she feels like her head is spinning, but it is no where near as often as before. Reports that her ear also feels much better.   MD to be made aware.

## 2017-03-13 NOTE — Telephone Encounter (Signed)
noted 

## 2017-03-13 NOTE — Telephone Encounter (Signed)
-----   Message from Alycia Rossetti, MD sent at 03/12/2017  7:34 AM EST ----- Regarding: FW: Call Monday to check on dizziness  See how she is doing?  If feeling better, continue to stay off the norvasc for now They can check her BP at home a few times a week, call if > 140/90    ----- Message ----- From: Alycia Rossetti, MD Sent: 03/12/2017 To: Alycia Rossetti, MD Subject: Call Monday to check on dizziness

## 2017-03-22 DIAGNOSIS — N390 Urinary tract infection, site not specified: Secondary | ICD-10-CM | POA: Diagnosis not present

## 2017-03-26 ENCOUNTER — Encounter: Payer: Self-pay | Admitting: Family Medicine

## 2017-03-26 ENCOUNTER — Ambulatory Visit (INDEPENDENT_AMBULATORY_CARE_PROVIDER_SITE_OTHER): Payer: Medicare HMO | Admitting: Family Medicine

## 2017-03-26 ENCOUNTER — Other Ambulatory Visit: Payer: Self-pay

## 2017-03-26 VITALS — BP 128/70 | HR 64 | Temp 98.6°F | Resp 16 | Ht <= 58 in | Wt 153.0 lb

## 2017-03-26 DIAGNOSIS — R109 Unspecified abdominal pain: Secondary | ICD-10-CM | POA: Diagnosis not present

## 2017-03-26 DIAGNOSIS — N390 Urinary tract infection, site not specified: Secondary | ICD-10-CM | POA: Diagnosis not present

## 2017-03-26 DIAGNOSIS — R319 Hematuria, unspecified: Secondary | ICD-10-CM | POA: Diagnosis not present

## 2017-03-26 LAB — URINALYSIS, ROUTINE W REFLEX MICROSCOPIC
Bilirubin Urine: NEGATIVE
Ketones, ur: NEGATIVE
Nitrite: POSITIVE — AB
Specific Gravity, Urine: 1.02 (ref 1.001–1.03)
pH: 7 (ref 5.0–8.0)

## 2017-03-26 LAB — MICROSCOPIC MESSAGE

## 2017-03-26 NOTE — Patient Instructions (Addendum)
Complete antibiotics  Continue current medications We will call with culture  Cancel appointment for Friday  F/U 3 months

## 2017-03-26 NOTE — Progress Notes (Signed)
   Subjective:    Patient ID: [name], female    DOB: [birth year], 83 y.o.   MRN: [id number]  Patient presents for R Flank Pain (x1 week- right sided flank pain radiating down to R lower ABD- has been seen at [facility] and given Macrobid and Pyridium)  Patient here with her daughter from [place].  She was seen at the urgent care last Thursday after having episodes of lower abdominal pain and dysuria.  She was diagnosed with urinary tract infection and prescribed Macrobid as well as Pyridium.  She states that she still gets a little upset stomach but does not have any pain no pain with urinating no blood in the urine.  He denies any change in her bowels no vomiting think that she had a little fever before she went to see their urgent care.  She feels much better today.  She still has 3 days left of Macrobid. Eating fair, not drinking a lot of fluids  No her previous vertigo symptoms have resolved.   Review Of Systems:  GEN- denies fatigue, fever, weight loss,weakness, recent illness HEENT- denies eye drainage, change in vision, nasal discharge, CVS- denies chest pain, palpitations RESP- denies SOB, cough, wheeze ABD- denies N/V, change in stools, abd pain GU- denies dysuria, hematuria, dribbling, incontinence MSK- denies joint pain, muscle aches, injury Neuro- denies headache, dizziness, syncope, seizure activity       Objective:    BP 128/70   Pulse 64   Temp 98.6 F (37 C) (Oral)   Resp 16   Ht 4\' 9"  (1.448 m)   Wt 153 lb (69.4 kg)   SpO2 97%   BMI 33.11 kg/m  GEN- NAD, alert and oriented x3 HEENT- PERRL, EOMI, non injected sclera, pink conjunctiva, MMM, oropharynx clear Neck- Supple,  CVS- RRR, no murmur RESP-CTAB ABD-NABS,soft,NT,ND, no CVA tenderness  EXT-trace chronic ankle edema Pulses- Radial 2+        Assessment & Plan:      Problem List Items Addressed This Visit    None    Visit Diagnoses    Urinary tract infection with hematuria, site unspecified     -  Primary   UA consistent with resolving UTI, symptoms also improved, discussed drinking more fluids during day, can also have cranberry, culture sent, will change antibiotic if needed Reviewed UC notes   Relevant Medications   nitrofurantoin, macrocrystal-monohydrate, (MACROBID) 100 MG capsule   phenazopyridine (PYRIDIUM) 200 MG tablet   Other Relevant Orders   Urinalysis, Routine w reflex microscopic (Completed)   Urine Culture      Note: This dictation was prepared with Dragon dictation along with smaller phrase technology. Any transcriptional errors that result from this process are unintentional.

## 2017-03-27 LAB — URINE CULTURE
MICRO NUMBER:: 81463530
SPECIMEN QUALITY:: ADEQUATE

## 2017-03-30 ENCOUNTER — Ambulatory Visit: Payer: Medicare HMO | Admitting: Family Medicine

## 2017-04-04 ENCOUNTER — Other Ambulatory Visit: Payer: Self-pay | Admitting: *Deleted

## 2017-04-04 MED ORDER — TRAMADOL HCL 50 MG PO TABS: 50.0000 mg | ORAL_TABLET | Freq: Two times a day (BID) | ORAL | 3 refills | Status: DC | PRN

## 2017-04-04 NOTE — Telephone Encounter (Signed)
Received fax requesting refill on Tramadol.   Ok to refill??  Last office visit 03/26/2017.  Last refill 10/25/2016, #3 refills.

## 2017-04-22 ENCOUNTER — Other Ambulatory Visit: Payer: Self-pay | Admitting: Family Medicine

## 2017-05-28 ENCOUNTER — Other Ambulatory Visit: Payer: Self-pay | Admitting: Family Medicine

## 2017-06-11 DIAGNOSIS — R809 Proteinuria, unspecified: Secondary | ICD-10-CM | POA: Diagnosis not present

## 2017-06-11 DIAGNOSIS — E559 Vitamin D deficiency, unspecified: Secondary | ICD-10-CM | POA: Diagnosis not present

## 2017-06-11 DIAGNOSIS — D509 Iron deficiency anemia, unspecified: Secondary | ICD-10-CM | POA: Diagnosis not present

## 2017-06-11 DIAGNOSIS — Z79899 Other long term (current) drug therapy: Secondary | ICD-10-CM | POA: Diagnosis not present

## 2017-06-11 DIAGNOSIS — N183 Chronic kidney disease, stage 3 (moderate): Secondary | ICD-10-CM | POA: Diagnosis not present

## 2017-06-11 DIAGNOSIS — I10 Essential (primary) hypertension: Secondary | ICD-10-CM | POA: Diagnosis not present

## 2017-06-13 DIAGNOSIS — E1129 Type 2 diabetes mellitus with other diabetic kidney complication: Secondary | ICD-10-CM | POA: Diagnosis not present

## 2017-06-13 DIAGNOSIS — I10 Essential (primary) hypertension: Secondary | ICD-10-CM | POA: Diagnosis not present

## 2017-06-13 DIAGNOSIS — M9080 Osteopathy in diseases classified elsewhere, unspecified site: Secondary | ICD-10-CM | POA: Diagnosis not present

## 2017-06-13 DIAGNOSIS — E872 Acidosis: Secondary | ICD-10-CM | POA: Diagnosis not present

## 2017-06-13 DIAGNOSIS — R809 Proteinuria, unspecified: Secondary | ICD-10-CM | POA: Diagnosis not present

## 2017-06-13 DIAGNOSIS — E889 Metabolic disorder, unspecified: Secondary | ICD-10-CM | POA: Diagnosis not present

## 2017-06-13 DIAGNOSIS — N183 Chronic kidney disease, stage 3 (moderate): Secondary | ICD-10-CM | POA: Diagnosis not present

## 2017-06-13 DIAGNOSIS — D509 Iron deficiency anemia, unspecified: Secondary | ICD-10-CM | POA: Diagnosis not present

## 2017-06-25 ENCOUNTER — Telehealth: Payer: Self-pay | Admitting: *Deleted

## 2017-06-25 ENCOUNTER — Other Ambulatory Visit: Payer: Self-pay

## 2017-06-25 ENCOUNTER — Ambulatory Visit (INDEPENDENT_AMBULATORY_CARE_PROVIDER_SITE_OTHER): Payer: Medicare HMO | Admitting: Family Medicine

## 2017-06-25 ENCOUNTER — Encounter: Payer: Self-pay | Admitting: Family Medicine

## 2017-06-25 VITALS — BP 130/72 | HR 70 | Temp 98.3°F | Resp 16 | Ht <= 58 in | Wt 152.0 lb

## 2017-06-25 DIAGNOSIS — F419 Anxiety disorder, unspecified: Secondary | ICD-10-CM

## 2017-06-25 DIAGNOSIS — Z8673 Personal history of transient ischemic attack (TIA), and cerebral infarction without residual deficits: Secondary | ICD-10-CM

## 2017-06-25 DIAGNOSIS — R69 Illness, unspecified: Secondary | ICD-10-CM | POA: Diagnosis not present

## 2017-06-25 DIAGNOSIS — D631 Anemia in chronic kidney disease: Secondary | ICD-10-CM | POA: Diagnosis not present

## 2017-06-25 DIAGNOSIS — F329 Major depressive disorder, single episode, unspecified: Secondary | ICD-10-CM

## 2017-06-25 DIAGNOSIS — M170 Bilateral primary osteoarthritis of knee: Secondary | ICD-10-CM

## 2017-06-25 DIAGNOSIS — N183 Chronic kidney disease, stage 3 unspecified: Secondary | ICD-10-CM

## 2017-06-25 DIAGNOSIS — I10 Essential (primary) hypertension: Secondary | ICD-10-CM | POA: Diagnosis not present

## 2017-06-25 DIAGNOSIS — F32A Depression, unspecified: Secondary | ICD-10-CM

## 2017-06-25 MED ORDER — LORAZEPAM 1 MG PO TABS: 1.0000 mg | ORAL_TABLET | Freq: Every day | ORAL | 2 refills | Status: DC

## 2017-06-25 MED ORDER — ATORVASTATIN CALCIUM 20 MG PO TABS
ORAL_TABLET | ORAL | 2 refills | Status: DC
Start: 2017-06-25 — End: 2018-03-29

## 2017-06-25 MED ORDER — ASPIRIN-DIPYRIDAMOLE ER 25-200 MG PO CP12: ORAL_CAPSULE | ORAL | 2 refills | Status: DC

## 2017-06-25 MED ORDER — OMEPRAZOLE 20 MG PO CPDR: 20.0000 mg | DELAYED_RELEASE_CAPSULE | Freq: Every day | ORAL | 2 refills | Status: DC

## 2017-06-25 MED ORDER — LABETALOL HCL 300 MG PO TABS: ORAL_TABLET | ORAL | 2 refills | Status: DC

## 2017-06-25 MED ORDER — MECLIZINE HCL 12.5 MG PO TABS: 12.5000 mg | ORAL_TABLET | Freq: Three times a day (TID) | ORAL | 1 refills | Status: DC | PRN

## 2017-06-25 NOTE — Assessment & Plan Note (Signed)
Well controlled no changes 

## 2017-06-25 NOTE — Assessment & Plan Note (Signed)
Per nephrology on iron supplement

## 2017-06-25 NOTE — Telephone Encounter (Signed)
-----   Message from Alycia Rossetti, MD sent at 06/25/2017  1:55 PM EDT ----- Regarding: Call pt daughter,  She states she ran out of eye drop, none at her pharmacy Does she know who prescribed or where she gets her drops from

## 2017-06-25 NOTE — Progress Notes (Signed)
   Subjective:    Patient ID: [name], female    DOB: [birth year], 84 y.o.   MRN: [id number]  Patient presents for Follow-up (is fasting)  Pt here to f/u chronic medical problems. Appetite is good,. She maintaining her weight She is still followed by Nephrology for CKD, states things have been good   OA knees- uses Ultarm as needed, wants another injection by ortho  CKD- following with nephrology, reviewed note, holding vitamin D, taking iron for anemia as well    HTN- taking BP meds as prescribed, no side effects    Medications reviewed  Review Of Systems:  GEN- denies fatigue, fever, weight loss,weakness, recent illness HEENT- denies eye drainage, change in vision, nasal discharge, CVS- denies chest pain, palpitations RESP- denies SOB, cough, wheeze ABD- denies N/V, change in stools, abd pain GU- denies dysuria, hematuria, dribbling, incontinence MSK- + joint pain, muscle aches, injury Neuro- denies headache, dizziness, syncope, seizure activity       Objective:    BP 130/72   Pulse 70   Temp 98.3 F (36.8 C) (Oral)   Resp 16   Ht 4\' 9"  (1.448 m)   Wt 152 lb (68.9 kg)   SpO2 98%   BMI 32.89 kg/m  GEN- NAD, alert and oriented x3 HEENT- PERRL, EOMI, non injected sclera, pink conjunctiva, MMM, oropharynx clear Neck- Supple, no thyromegaly CVS- RRR, no murmur RESP-CTAB ABD-NABS,soft,NT,ND EXT- trace ankle edema Pulses- Radial, DP- 2+        Assessment & Plan:      Problem List Items Addressed This Visit      Unprioritized   Chronic kidney disease   History of stroke    Continue aggrenox, doing well at home, has supportive family/children, maintaining weight      Essential hypertension - Primary    Well controlled no changes      Bilateral primary osteoarthritis of knee    Continue ultram Given number to call her ortho for another steroid injection      Anxiety and depression    Prn ativan, also helps sleep, uses very sparingly      ANEMIA    Per nephrology on iron supplement      Relevant Medications   iron polysaccharides (NIFEREX) 150 MG capsule      Note: This dictation was prepared with Dragon dictation along with smaller phrase technology. Any transcriptional errors that result from this process are unintentional.

## 2017-06-25 NOTE — Telephone Encounter (Signed)
Call placed to patient daughter, Jarrett Ables. Was advised that patient was last seen at [place] with [facility]. [phone]~ telephone.   Call placed to [facility]. [place].

## 2017-06-25 NOTE — Assessment & Plan Note (Signed)
Continue aggrenox, doing well at home, has supportive family/children, maintaining weight

## 2017-06-25 NOTE — Assessment & Plan Note (Signed)
Continue ultram Given number to call her ortho for another steroid injection

## 2017-06-25 NOTE — Patient Instructions (Addendum)
Call Dr. Sharol Given for knee injections [phone] We will check on eye drops  F/U 4 months for Physical

## 2017-06-25 NOTE — Assessment & Plan Note (Signed)
Prn ativan, also helps sleep, uses very sparingly

## 2017-06-26 NOTE — Telephone Encounter (Signed)
Call placed to [facility].

## 2017-06-28 NOTE — Telephone Encounter (Signed)
I cant refill an eye drop if we dont know what it is.  Have them schedule  Her an eye appointment then and get updated medications for her eyes

## 2017-06-28 NOTE — Telephone Encounter (Signed)
Call placed to [facility]. Was advised that that patient is seen at another office.   Call placed to [facility] [phone]~ telephone.   Was advised that patient has not been seen since 2017.

## 2017-06-28 NOTE — Telephone Encounter (Signed)
Call placed to patient and patient son made aware.   Reports that patient is currently using OTC medication and is not having any further issue.

## 2017-07-02 ENCOUNTER — Ambulatory Visit (INDEPENDENT_AMBULATORY_CARE_PROVIDER_SITE_OTHER): Payer: Medicare HMO

## 2017-07-02 ENCOUNTER — Encounter (INDEPENDENT_AMBULATORY_CARE_PROVIDER_SITE_OTHER): Payer: Self-pay | Admitting: Orthopaedic Surgery

## 2017-07-02 ENCOUNTER — Ambulatory Visit (INDEPENDENT_AMBULATORY_CARE_PROVIDER_SITE_OTHER): Payer: Medicare HMO | Admitting: Orthopaedic Surgery

## 2017-07-02 DIAGNOSIS — M170 Bilateral primary osteoarthritis of knee: Secondary | ICD-10-CM

## 2017-07-02 MED ORDER — LIDOCAINE HCL 1 % IJ SOLN
2.0000 mL | INTRAMUSCULAR | Status: AC | PRN
  Administered 2017-07-02: 2 mL

## 2017-07-02 MED ORDER — BUPIVACAINE HCL 0.5 % IJ SOLN
2.0000 mL | INTRAMUSCULAR | Status: AC | PRN
  Administered 2017-07-02: 2 mL via INTRA_ARTICULAR

## 2017-07-02 MED ORDER — METHYLPREDNISOLONE ACETATE 40 MG/ML IJ SUSP
40.0000 mg | INTRAMUSCULAR | Status: AC | PRN
  Administered 2017-07-02: 40 mg via INTRA_ARTICULAR

## 2017-07-02 NOTE — Progress Notes (Signed)
   Office Visit Note   Patient: [name]           Date of Birth: 1930-06-07           MRN: [id number] Visit Date: 07/02/2017              Requested by: Alycia Rossetti, MD [address] [place], [place] [address] PCP: Alycia Rossetti, MD   Assessment & Plan: Visit Diagnoses:  1. Bilateral primary osteoarthritis of knee     Plan: bilateral knee injections performed.  F/u prn.  Follow-Up Instructions: Return if symptoms worsen or fail to improve.   Orders:  No orders of the defined types were placed in this encounter.  No orders of the defined types were placed in this encounter.     Procedures: Large Joint Inj: bilateral knee on 07/02/2017 1:23 PM Indications: pain Details: 22 G needle  Arthrogram: No  Medications (Right): 2 mL lidocaine 1 %; 2 mL bupivacaine 0.5 %; 40 mg methylPREDNISolone acetate 40 MG/ML Medications (Left): 2 mL lidocaine 1 %; 2 mL bupivacaine 0.5 %; 40 mg methylPREDNISolone acetate 40 MG/ML Outcome: tolerated well, no immediate complications Patient was prepped and draped in the usual sterile fashion.       Clinical Data: No additional findings.   Subjective: Chief Complaint  Patient presents with  . Right Knee - Pain  . Left Knee - Pain    Patient comes in today for f/u bilateral knee OA.  Requesting coritsone injections.  Previous ones gave her 8 months of relief.   Review of Systems   Objective: Vital Signs: There were no vitals taken for this visit.  Physical Exam  Ortho Exam Exam stable Specialty Comments:  No specialty comments available.  Imaging: No results found.   PMFS History: Patient Active Problem List   Diagnosis Date Noted  . Bilateral primary osteoarthritis of knee 11/09/2016  . History of stroke 06/02/2016  . Osteoarthritis 03/25/2014  . Anxiety and depression 02/20/2012  . Chronic headache 02/20/2012  . Peripheral edema 02/19/2012  . Hyperparathyroidism ([place]) 10/23/2008  . ANEMIA  10/23/2008  . Essential hypertension 10/23/2008  . GALLSTONE PANCREATITIS 10/23/2008  . Chronic kidney disease 10/23/2008  . DEGENERATIVE JOINT DISEASE 10/23/2008  . PROTEINURIA 10/23/2008   Past Medical History:  Diagnosis Date  . Anxiety   . Arthritis   . CKD (chronic kidney disease)    Dr. Hinda Lenis  . Depression   . GERD (gastroesophageal reflux disease)   . Hyperlipidemia   . Hypertension   . Osteoporosis   . Stroke ([place])   . UTI (lower urinary tract infection)     Family History  Problem Relation Age of Onset  . Cancer Mother   . Cancer Father     Past Surgical History:  Procedure Laterality Date  . CHOLECYSTECTOMY     Social History   Occupational History  . Not on file  Tobacco Use  . Smoking status: Never Smoker  . Smokeless tobacco: Never Used  Substance and Sexual Activity  . Alcohol use: No  . Drug use: No  . Sexual activity: Never

## 2017-08-07 ENCOUNTER — Other Ambulatory Visit: Payer: Self-pay | Admitting: Family Medicine

## 2017-08-07 NOTE — Telephone Encounter (Signed)
Ok to refill??  Last office visit 06/25/2017.  Last refill 04/04/2017, #3 refills.

## 2017-09-08 DIAGNOSIS — J309 Allergic rhinitis, unspecified: Secondary | ICD-10-CM | POA: Diagnosis not present

## 2017-09-08 DIAGNOSIS — K219 Gastro-esophageal reflux disease without esophagitis: Secondary | ICD-10-CM | POA: Diagnosis not present

## 2017-09-08 DIAGNOSIS — R69 Illness, unspecified: Secondary | ICD-10-CM | POA: Diagnosis not present

## 2017-09-08 DIAGNOSIS — E785 Hyperlipidemia, unspecified: Secondary | ICD-10-CM | POA: Diagnosis not present

## 2017-09-08 DIAGNOSIS — Z7982 Long term (current) use of aspirin: Secondary | ICD-10-CM | POA: Diagnosis not present

## 2017-09-08 DIAGNOSIS — M170 Bilateral primary osteoarthritis of knee: Secondary | ICD-10-CM | POA: Diagnosis not present

## 2017-09-08 DIAGNOSIS — G3184 Mild cognitive impairment, so stated: Secondary | ICD-10-CM | POA: Diagnosis not present

## 2017-09-08 DIAGNOSIS — I951 Orthostatic hypotension: Secondary | ICD-10-CM | POA: Diagnosis not present

## 2017-09-08 DIAGNOSIS — R32 Unspecified urinary incontinence: Secondary | ICD-10-CM | POA: Diagnosis not present

## 2017-09-08 DIAGNOSIS — I10 Essential (primary) hypertension: Secondary | ICD-10-CM | POA: Diagnosis not present

## 2017-09-10 ENCOUNTER — Other Ambulatory Visit: Payer: Self-pay | Admitting: Family Medicine

## 2017-09-10 NOTE — Telephone Encounter (Signed)
Ok to refill??  Last office visit 06/25/2017.  Last refill 08/07/2017.

## 2017-10-16 DIAGNOSIS — E559 Vitamin D deficiency, unspecified: Secondary | ICD-10-CM | POA: Diagnosis not present

## 2017-10-16 DIAGNOSIS — N183 Chronic kidney disease, stage 3 (moderate): Secondary | ICD-10-CM | POA: Diagnosis not present

## 2017-10-16 DIAGNOSIS — Z79899 Other long term (current) drug therapy: Secondary | ICD-10-CM | POA: Diagnosis not present

## 2017-10-16 DIAGNOSIS — D509 Iron deficiency anemia, unspecified: Secondary | ICD-10-CM | POA: Diagnosis not present

## 2017-10-16 DIAGNOSIS — R809 Proteinuria, unspecified: Secondary | ICD-10-CM | POA: Diagnosis not present

## 2017-10-16 DIAGNOSIS — I10 Essential (primary) hypertension: Secondary | ICD-10-CM | POA: Diagnosis not present

## 2017-10-17 ENCOUNTER — Other Ambulatory Visit: Payer: Self-pay | Admitting: Family Medicine

## 2017-10-17 DIAGNOSIS — R809 Proteinuria, unspecified: Secondary | ICD-10-CM | POA: Diagnosis not present

## 2017-10-17 DIAGNOSIS — E870 Hyperosmolality and hypernatremia: Secondary | ICD-10-CM | POA: Diagnosis not present

## 2017-10-17 DIAGNOSIS — N184 Chronic kidney disease, stage 4 (severe): Secondary | ICD-10-CM | POA: Diagnosis not present

## 2017-10-17 DIAGNOSIS — E872 Acidosis: Secondary | ICD-10-CM | POA: Diagnosis not present

## 2017-10-17 NOTE — Telephone Encounter (Signed)
Refill tramadol to [facility].

## 2017-10-18 NOTE — Telephone Encounter (Signed)
Ok to refill??  Last office visit 06/25/2017.  Last refill 09/10/2017.

## 2017-10-19 MED ORDER — TRAMADOL HCL 50 MG PO TABS: ORAL_TABLET | ORAL | 2 refills | Status: DC

## 2017-10-26 ENCOUNTER — Ambulatory Visit (INDEPENDENT_AMBULATORY_CARE_PROVIDER_SITE_OTHER): Payer: Medicare HMO | Admitting: Family Medicine

## 2017-10-26 ENCOUNTER — Encounter: Payer: Self-pay | Admitting: Family Medicine

## 2017-10-26 ENCOUNTER — Other Ambulatory Visit: Payer: Self-pay

## 2017-10-26 VITALS — BP 122/64 | HR 62 | Temp 98.5°F | Resp 14 | Ht <= 58 in | Wt 151.0 lb

## 2017-10-26 DIAGNOSIS — I10 Essential (primary) hypertension: Secondary | ICD-10-CM | POA: Diagnosis not present

## 2017-10-26 DIAGNOSIS — N183 Chronic kidney disease, stage 3 unspecified: Secondary | ICD-10-CM

## 2017-10-26 DIAGNOSIS — Z0000 Encounter for general adult medical examination without abnormal findings: Secondary | ICD-10-CM | POA: Diagnosis not present

## 2017-10-26 DIAGNOSIS — H539 Unspecified visual disturbance: Secondary | ICD-10-CM | POA: Diagnosis not present

## 2017-10-26 DIAGNOSIS — Z8673 Personal history of transient ischemic attack (TIA), and cerebral infarction without residual deficits: Secondary | ICD-10-CM

## 2017-10-26 LAB — LIPID PANEL
Cholesterol: 145 mg/dL (ref <200)
HDL: 59 mg/dL (ref >50)
LDL Cholesterol (Calc): 69 mg/dL (calc)
Non-HDL Cholesterol (Calc): 86 mg/dL (calc) (ref <130)
Total CHOL/HDL Ratio: 2.5 (calc) (ref <5.0)
Triglycerides: 91 mg/dL (ref <150)

## 2017-10-26 MED ORDER — DICLOFENAC SODIUM 1 % TD GEL: TRANSDERMAL | 6 refills | Status: DC

## 2017-10-26 MED ORDER — SODIUM BICARBONATE 650 MG PO TABS: 1300.0000 mg | ORAL_TABLET | Freq: Three times a day (TID) | ORAL | 2 refills | Status: DC

## 2017-10-26 MED ORDER — TETANUS-DIPHTH-ACELL PERTUSSIS 5-2.5-18.5 LF-MCG/0.5 IM SUSP: 0.5000 mL | Freq: Once | INTRAMUSCULAR | 0 refills | Status: AC

## 2017-10-26 MED ORDER — ZOSTER VAC RECOMB ADJUVANTED 50 MCG/0.5ML IM SUSR: 0.5000 mL | Freq: Once | INTRAMUSCULAR | 1 refills | Status: AC

## 2017-10-26 NOTE — Patient Instructions (Addendum)
Call My eye doctor Call the orthopedics- Dr. Erlinda Hong for injections, see if GEL injections can be given  Ask at pharmacy cost of TDAP/Shingles shot  F/U 4-5 months

## 2017-10-26 NOTE — Progress Notes (Signed)
Subjective:   Patient presents for Medicare Annual/Subsequent preventive examination.   Pt here for wellness exam  Seen by Dr. Shary Decamp- last week, given good report , still taking iron pills a few times a a week, takes her other meds per him Had labs done   Appetite has been good  Eyes watering a lot, has some " drops" at home but has not seen eye doctor in some time  Hyperlipidemia- due for cholesterol check on statin drug   Knee pain- OA is main complaint, thinking of getting shots again   Review Past Medical/Family/Social: Per EMR    Risk Factors  Current exercise habits: none Dietary issues discussed: No major concerns  Cardiac risk factors: Obesity (BMI >= 30 kg/m2). HTN  Depression Screen  (Note: if answer to either of the following is "Yes", a more complete depression screening is indicated)  Over the past two weeks, have you felt down, depressed or hopeless? No Over the past two weeks, have you felt little interest or pleasure in doing things? No Have you lost interest or pleasure in daily life? No Do you often feel hopeless? No Do you cry easily over simple problems? No   Activities of Daily Living  In your present state of health, do you have any difficulty performing the following activities?:  Driving? Yes  Managing money? No  Feeding yourself? No  Getting from bed to chair? No  Climbing a flight of stairs? Yes  Preparing food and eating?: No  Bathing or showering? No  Getting dressed: No  Getting to the toilet? No  Using the toilet:No  Moving around from place to place: No  In the past year have you fallen or had a near fall?:No  Are you sexually active? No  Do you have more than one partner? No   Hearing Difficulties: No  Do you often ask people to speak up or repeat themselves? No  Do you experience ringing or noises in your ears? No Do you have difficulty understanding soft or whispered voices? No  Do you feel that you have a problem with  memory? Yes Do you often misplace items? Yes sometimes  Do you feel safe at home? Yes  Cognitive Testing  Alert? Yes Normal Appearance?Yes  Oriented to person? Yes Place? Yes  Time? Yes  Recall of three objects? Yes  Can perform simple calculations? Yes  Displays appropriate judgment?Yes  Can read the correct time from a watch face?Yes   List the Names of Other Physician/Practitioners you currently use:   Nephrology, Orthopedics  Screening Tests / Date Colonoscopy  - passed age                   Zostavax  Due Pneumonia- UTD  Influenza Vaccine  Tetanus/tdap Due  ROS: GEN- denies fatigue, fever, weight loss,weakness, recent illness HEENT- denies eye drainage, change in vision, nasal discharge, CVS- denies chest pain, palpitations RESP- denies SOB, cough, wheeze ABD- denies N/V, change in stools, abd pain GU- denies dysuria, hematuria, dribbling, incontinence MSK- + joint pain, muscle aches, injury Neuro- denies headache, dizziness, syncope, seizure activity   Physical: GEN- NAD, alert and oriented x3 HEENT- PERRL, EOMI, non injected sclera, pink conjunctiva, MMM, oropharynx clear Neck- Supple, no thryomegaly, no bruit CVS- RRR, no murmur RESP-CTAB ABD-NABS,soft,NT,ND EXT-trace ankle edema Pulses- Radial, DP- 2+    Assessment:    Annual wellness medicare exam   Plan:    During the course of the visit the patient was educated and  counseled about appropriate screening and preventive services including:  CAGE/DEPRESSION/FALL SCREEN NEGATIVE  Shingles vaccine/ TDAP. Prescription given to that she can get the vaccine at the pharmacy or Medicare part D.  H/O STROKE- BP controlled, continued on aggrenox, no abnormal bleeding.   Vision changes, eyes watering- needs f/u eye doctor, glucoma check, daughter to schedule  CKD- obtain note from nephrology  Daughter - POA /has living will  Diet review for nutrition referral? Yes ____ Not Indicated __x__  Patient  Instructions (the written plan) was given to the patient.  Medicare Attestation  I have personally reviewed:  The patient's medical and social history  Their use of alcohol, tobacco or illicit drugs  Their current medications and supplements  The patient's functional ability including ADLs,fall risks, home safety risks, cognitive, and hearing and visual impairment  Diet and physical activities  Evidence for depression or mood disorders  The patient's weight, height, BMI, and visual acuity have been recorded in the chart. I have made referrals, counseling, and provided education to the patient based on review of the above and I have provided the patient with a written personalized care plan for preventive services.

## 2017-10-28 ENCOUNTER — Encounter: Payer: Self-pay | Admitting: Family Medicine

## 2017-10-29 ENCOUNTER — Encounter: Payer: Self-pay | Admitting: *Deleted

## 2017-10-29 ENCOUNTER — Telehealth: Payer: Self-pay | Admitting: *Deleted

## 2017-10-29 MED ORDER — DICLOFENAC SODIUM 1 % TD GEL: TRANSDERMAL | 11 refills | Status: DC

## 2017-10-29 NOTE — Telephone Encounter (Signed)
Your information has been sent to [facility].

## 2017-10-29 NOTE — Telephone Encounter (Signed)
Received PA determination.   PA [id number] approved 03/25/2017- 03/26/2018.  Pharmacy made aware.

## 2017-10-29 NOTE — Telephone Encounter (Signed)
Received request from pharmacy for PA on Voltaren Gel.  PA submitted.   Dx: M15.0- OA

## 2017-12-02 NOTE — Progress Notes (Signed)
error 

## 2017-12-18 ENCOUNTER — Other Ambulatory Visit: Payer: Self-pay

## 2017-12-18 ENCOUNTER — Ambulatory Visit (INDEPENDENT_AMBULATORY_CARE_PROVIDER_SITE_OTHER): Payer: Medicare HMO | Admitting: Family Medicine

## 2017-12-18 ENCOUNTER — Encounter: Payer: Self-pay | Admitting: Family Medicine

## 2017-12-18 VITALS — BP 128/66 | HR 60 | Temp 98.7°F | Resp 16 | Ht <= 58 in | Wt 151.0 lb

## 2017-12-18 DIAGNOSIS — M170 Bilateral primary osteoarthritis of knee: Secondary | ICD-10-CM

## 2017-12-18 DIAGNOSIS — Z23 Encounter for immunization: Secondary | ICD-10-CM

## 2017-12-18 NOTE — Patient Instructions (Addendum)
Follow up with your knee Dr. (orthopedist)  Please let me know if you cannot see them or for whatever reason need a new referral.    Last referral I see was for [facility]  [address], [place], [place] [address] Phone: [phone]   Pain meds will be sent through when this prescription is up.  Request other meds refill through pharmacy and we will make sure to have 90 day supply.     Osteoarthritis Osteoarthritis is a type of arthritis that affects tissue that covers the ends of bones in joints (cartilage). Cartilage acts as a cushion between the bones and helps them move smoothly. Osteoarthritis results when cartilage in the joints gets worn down. Osteoarthritis is sometimes called "wear and tear" arthritis. Osteoarthritis is the most common form of arthritis. It often occurs in older people. It is a condition that gets worse over time (a progressive condition). Joints that are most often affected by this condition are in:  Fingers.  Toes.  Hips.  Knees.  Spine, including neck and lower back.  What are the causes? This condition is caused by age-related wearing down of cartilage that covers the ends of bones. What increases the risk? The following factors may make you more likely to develop this condition:  Older age.  Being overweight or obese.  Overuse of joints, such as in athletes.  Past injury of a joint.  Past surgery on a joint.  Family history of osteoarthritis.  What are the signs or symptoms? The main symptoms of this condition are pain, swelling, and stiffness in the joint. The joint may lose its shape over time. Small pieces of bone or cartilage may break off and float inside of the joint, which may cause more pain and damage to the joint. Small deposits of bone (osteophytes) may grow on the edges of the joint. Other symptoms may include:  A grating or scraping feeling inside the joint when you move it.  Popping or creaking sounds when you  move.  Symptoms may affect one or more joints. Osteoarthritis in a major joint, such as your knee or hip, can make it painful to walk or exercise. If you have osteoarthritis in your hands, you might not be able to grip items, twist your hand, or control small movements of your hands and fingers (fine motor skills). How is this diagnosed? This condition may be diagnosed based on:  Your medical history.  A physical exam.  Your symptoms.  X-rays of the affected joint(s).  Blood tests to rule out other types of arthritis.  How is this treated? There is no cure for this condition, but treatment can help to control pain and improve joint function. Treatment plans may include:  A prescribed exercise program that allows for rest and joint relief. You may work with a physical therapist.  A weight control plan.  Pain relief techniques, such as: ? Applying heat and cold to the joint. ? Electric pulses delivered to nerve endings under the skin (transcutaneous electrical nerve stimulation, or TENS). ? Massage. ? Certain nutritional supplements.  NSAIDs or prescription medicines to help relieve pain.  Medicine to help relieve pain and inflammation (corticosteroids). This can be given by mouth (orally) or as an injection.  Assistive devices, such as a brace, wrap, splint, specialized glove, or cane.  Surgery, such as: ? An osteotomy. This is done to reposition the bones and relieve pain or to remove loose pieces of bone and cartilage. ? Joint replacement  surgery. You may need this surgery if you have very bad (advanced) osteoarthritis.  Follow these instructions at home: Activity  Rest your affected joints as directed by your health care provider.  Do not drive or use heavy machinery while taking prescription pain medicine.  Exercise as directed. Your health care provider or physical therapist may recommend specific types of exercise, such as: ? Strengthening exercises. These are done  to strengthen the muscles that support joints that are affected by arthritis. They can be performed with weights or with exercise bands to add resistance. ? Aerobic activities. These are exercises, such as brisk walking or water aerobics, that get your heart pumping. ? Range-of-motion activities. These keep your joints easy to move. ? Balance and agility exercises. Managing pain, stiffness, and swelling  If directed, apply heat to the affected area as often as told by your health care provider. Use the heat source that your health care provider recommends, such as a moist heat pack or a heating pad. ? If you have a removable assistive device, remove it as told by your health care provider. ? Place a towel between your skin and the heat source. If your health care provider tells you to keep the assistive device on while you apply heat, place a towel between the assistive device and the heat source. ? Leave the heat on for 20-30 minutes. ? Remove the heat if your skin turns bright red. This is especially important if you are unable to feel pain, heat, or cold. You may have a greater risk of getting burned.  If directed, put ice on the affected joint: ? If you have a removable assistive device, remove it as told by your health care provider. ? Put ice in a plastic bag. ? Place a towel between your skin and the bag. If your health care provider tells you to keep the assistive device on during icing, place a towel between the assistive device and the bag. ? Leave the ice on for 20 minutes, 2-3 times a day. General instructions  Take over-the-counter and prescription medicines only as told by your health care provider.  Maintain a healthy weight. Follow instructions from your health care provider for weight control. These may include dietary restrictions.  Do not use any products that contain nicotine or tobacco, such as cigarettes and e-cigarettes. These can delay bone healing. If you need help  quitting, ask your health care provider.  Use assistive devices as directed by your health care provider.  Keep all follow-up visits as told by your health care provider. This is important. Where to find more information:  [employer] of Arthritis and Musculoskeletal and Skin Diseases: www.niams.SouthExposed.es  [employer] on Aging: http://kim-miller.com/  American College of Rheumatology: www.rheumatology.org Contact a health care provider if:  Your skin turns red.  You develop a rash.  You have pain that gets worse.  You have a fever along with joint or muscle aches. Get help right away if:  You lose a lot of weight.  You suddenly lose your appetite.  You have night sweats. Summary  Osteoarthritis is a type of arthritis that affects tissue covering the ends of bones in joints (cartilage).  This condition is caused by age-related wearing down of cartilage that covers the ends of bones.  The main symptom of this condition is pain, swelling, and stiffness in the joint.  There is no cure for this condition, but treatment can help to control pain and improve joint function. This information  is not intended to replace advice given to you by your health care provider. Make sure you discuss any questions you have with your health care provider. Document Released: 03/13/2005 Document Revised: 11/15/2015 Document Reviewed: 11/15/2015 Elsevier Interactive Patient Education  [employer].

## 2017-12-18 NOTE — Progress Notes (Signed)
Patient ID: [name], female    DOB: 1930-04-28, 84 y.o.   MRN: [id number]  PCP: Alycia Rossetti, MD  Chief Complaint  Patient presents with  . B Leg Pain    reports increased pain and weakness in B legs    Subjective:   [name] is a 84 y.o. female, presents to clinic with CC of worsening b/l knee pain, chronic, gradually worsening, states last shot did not help much, it was over three months ago, she is here with her son and neither know what orthopedist she's been to.  Pain is located all over to both knees, achy, sore and stiff.  She cannot point to any area of worse pain.  She is on pain meds and using them as prescribed for chronic pain.  The help temporarily with knee pain, also uses voltaren gel and that helps as well.   There is no new swelling, redness, weakness or numbness  Her son is with her and she is carrying a bag of her medicines.  They are requesting 90 d supplies of all meds.  Most meds are already 90 d.  Do not see any that are due for refills except pain meds will be due soon, and explained 30 d supply on controlled substances.   Patient Active Problem List   Diagnosis Date Noted  . Bilateral primary osteoarthritis of knee 11/09/2016  . History of stroke 06/02/2016  . Osteoarthritis 03/25/2014  . Anxiety and depression 02/20/2012  . Chronic headache 02/20/2012  . Peripheral edema 02/19/2012  . Hyperparathyroidism ([place]) 10/23/2008  . ANEMIA 10/23/2008  . Essential hypertension 10/23/2008  . GALLSTONE PANCREATITIS 10/23/2008  . Chronic kidney disease 10/23/2008  . DEGENERATIVE JOINT DISEASE 10/23/2008  . PROTEINURIA 10/23/2008     Prior to Admission medications   Medication Sig Start Date End Date Taking? Authorizing Provider  albuterol (PROVENTIL HFA;VENTOLIN HFA) 108 (90 BASE) MCG/ACT inhaler Inhale 2 puffs into the lungs every 6 (six) hours as needed (wheezing/cough). 02/07/13  Yes [place], Tyler Pita, MD  amLODipine (NORVASC) 10 MG  tablet Take 1 tablet (10 mg total) by mouth daily. 10/25/16  Yes [place], Modena Nunnery, MD  aspirin EC 81 MG tablet Take 81 mg by mouth daily.   Yes [provider]  atorvastatin (LIPITOR) 20 MG tablet TAKE 1 TABLET(20 MG) BY MOUTH DAILY 06/25/17  Yes [place], Modena Nunnery, MD  cetirizine (ZYRTEC) 10 MG tablet Take 10 mg by mouth daily.   Yes [provider]  [place]-Cholecalciferol 4200-500 MG-UNIT CAPS Take 1 tablet by mouth daily.   Yes [provider]  diclofenac sodium (VOLTAREN) 1 % GEL Apply dime size to bilateral knees three times a day as needed 10/29/17  Yes [place], Modena Nunnery, MD  dipyridamole-aspirin (AGGRENOX) 200-25 MG 12hr capsule TAKE 1 CAPSULE BY MOUTH TWICE DAILY( EVERY TWELVE HOURS) 06/25/17  Yes [place], Modena Nunnery, MD  fluticasone [facility]) 50 MCG/ACT nasal spray Place 2 sprays into both nostrils daily as needed for allergies or rhinitis. 06/02/16 09/29/18 Yes [place], Modena Nunnery, MD  iron polysaccharides (NIFEREX) 150 MG capsule Take 150 mg by mouth daily.   Yes [provider]  labetalol (NORMODYNE) 300 MG tablet Take 1 tablet (300 mg total) by mouth 2 (two) times daily. 10/25/16  Yes [place], Modena Nunnery, MD  LORazepam (ATIVAN) 1 MG tablet Take 1 tablet (1 mg total) by mouth at bedtime. 06/25/17  Yes [place], Modena Nunnery, MD  meclizine (ANTIVERT) 12.5 MG tablet Take 1  tablet (12.5 mg total) by mouth 3 (three) times daily as needed for dizziness. 06/25/17  Yes [place], Modena Nunnery, MD  Omega-3 Fatty Acids (OMEGA-3 FISH OIL PO) Take by mouth.   Yes [provider]  omeprazole (PRILOSEC) 20 MG capsule Take 1 capsule (20 mg total) by mouth daily. 06/25/17  Yes [place], Modena Nunnery, MD  sodium bicarbonate 650 MG tablet Take 2 tablets (1,300 mg total) by mouth 3 (three) times daily. 10/26/17  Yes [place], Modena Nunnery, MD  traMADol (ULTRAM) 50 MG tablet TAKE 1 TABLET(50 MG) BY MOUTH TWICE DAILY AS NEEDED FOR PAIN 10/19/17  Yes [place], Modena Nunnery, MD  triamcinolone ointment (KENALOG) 0.1 %  Apply to mouth sore twice a day as needed for 1 week 03/25/14  Yes [place], Modena Nunnery, MD  Vitamin D, Cholecalciferol, 1000 UNITS TABS Take 1 tablet by mouth daily.   Yes [provider]     Allergies  Allergen Reactions  . Ciprofloxacin     Acute renal failure  . Diphenhydramine Hcl     REACTION: UNKNOWN REACTION  . Sulfonamide Derivatives Rash     Family History  Problem Relation Age of Onset  . Cancer Mother   . Cancer Father         Review of Systems  Constitutional: Negative.  Negative for activity change and fatigue.  HENT: Negative.   Eyes: Negative.   Respiratory: Negative.   Cardiovascular: Negative.   Gastrointestinal: Negative.   Endocrine: Negative.   Genitourinary: Negative.   Musculoskeletal: Positive for arthralgias. Negative for joint swelling.  Skin: Negative.  Negative for color change and pallor.  Allergic/Immunologic: Negative.   Neurological: Negative.  Negative for tremors and weakness.  Hematological: Negative.   Psychiatric/Behavioral: Negative.   All other systems reviewed and are negative.  10 Systems reviewed and are negative for acute change except as noted in the HPI.     Objective:    Vitals:   12/18/17 0902  BP: 128/66  Pulse: 60  Resp: 16  Temp: 98.7 F (37.1 C)  TempSrc: Oral  SpO2: 96%  Weight: 151 lb (68.5 kg)  Height: 4\' 9"  (1.448 m)      Physical Exam  Constitutional: She appears well-developed.  Elderly obese female, well appearing  HENT:  Head: Normocephalic and atraumatic.  Nose: Nose normal.  Eyes: Conjunctivae are normal. Right eye exhibits no discharge. Left eye exhibits no discharge.  Neck: No tracheal deviation present.  Cardiovascular: Normal rate and regular rhythm.  Pulmonary/Chest: Effort normal. No stridor. No respiratory distress.  Musculoskeletal: She exhibits no edema, tenderness or deformity.       Right knee: She exhibits decreased range of motion. She exhibits no swelling, no  effusion, no ecchymosis, no deformity and no erythema. No tenderness found.       Left knee: She exhibits decreased range of motion. She exhibits no swelling, no effusion, no ecchymosis, no deformity, no laceration and no erythema. No tenderness found.  Neurological: She is alert. She has normal strength. No sensory deficit. She exhibits normal muscle tone. Coordination normal.  Antalgic gait  Skin: Skin is warm and dry. No rash noted. No erythema.  Psychiatric: She has a normal mood and affect. Her behavior is normal.  Nursing note and vitals reviewed.         Assessment & Plan:      ICD-10-CM   1. Bilateral primary osteoarthritis of knee M17.0   2. Need for immunization against influenza Z23 Flu Vaccine QUAD 36+ mos  IM    Chronic b/l knee pain, pain meds helping, needs refill when up, has ortho, will f/up for eval / repeated joint injections.   Delsa Grana, PA-C 12/18/17 9:15 AM

## 2018-01-14 ENCOUNTER — Other Ambulatory Visit: Payer: Self-pay | Admitting: Family Medicine

## 2018-01-17 ENCOUNTER — Telehealth: Payer: Self-pay | Admitting: *Deleted

## 2018-01-17 MED ORDER — TRAMADOL HCL 50 MG PO TABS: 50.0000 mg | ORAL_TABLET | Freq: Three times a day (TID) | ORAL | 1 refills | Status: DC | PRN

## 2018-01-17 NOTE — Telephone Encounter (Signed)
Received call from patient daughter Jarrett Ables.   Reports that patient is having more pain in knees and is taking Tramadol for pain. States that she is only allowed 2 pills per day for her pain, but this is not effective. Requested to increase medication to a qty of 90 tabs with TID PRN dosing.   Please advise.

## 2018-01-17 NOTE — Telephone Encounter (Signed)
I will increase to TID dosing, she also has voltaren gel, tylenol to take and can call orthopedics for injection  Please monitor her if she takes 3 tramadol in a day, this medication may make her sedated the higher the doses.

## 2018-01-18 NOTE — Telephone Encounter (Signed)
Call placed to patient and patient caregivers made aware.   Will call ortho to discuss injection.

## 2018-03-29 ENCOUNTER — Encounter: Payer: Self-pay | Admitting: Family Medicine

## 2018-03-29 ENCOUNTER — Other Ambulatory Visit: Payer: Self-pay

## 2018-03-29 ENCOUNTER — Telehealth: Payer: Self-pay | Admitting: *Deleted

## 2018-03-29 ENCOUNTER — Ambulatory Visit (INDEPENDENT_AMBULATORY_CARE_PROVIDER_SITE_OTHER): Payer: Medicare HMO | Admitting: Family Medicine

## 2018-03-29 VITALS — BP 138/84 | HR 70 | Temp 98.6°F | Resp 14 | Ht <= 58 in | Wt 150.0 lb

## 2018-03-29 DIAGNOSIS — M170 Bilateral primary osteoarthritis of knee: Secondary | ICD-10-CM | POA: Diagnosis not present

## 2018-03-29 DIAGNOSIS — N183 Chronic kidney disease, stage 3 unspecified: Secondary | ICD-10-CM

## 2018-03-29 DIAGNOSIS — R609 Edema, unspecified: Secondary | ICD-10-CM

## 2018-03-29 DIAGNOSIS — I10 Essential (primary) hypertension: Secondary | ICD-10-CM

## 2018-03-29 DIAGNOSIS — E213 Hyperparathyroidism, unspecified: Secondary | ICD-10-CM

## 2018-03-29 MED ORDER — TRAMADOL HCL 50 MG PO TABS: 50.0000 mg | ORAL_TABLET | Freq: Three times a day (TID) | ORAL | 1 refills | Status: DC | PRN

## 2018-03-29 MED ORDER — OMEPRAZOLE 20 MG PO CPDR: 20.0000 mg | DELAYED_RELEASE_CAPSULE | Freq: Every day | ORAL | 2 refills | Status: DC

## 2018-03-29 MED ORDER — ZOSTER VAC RECOMB ADJUVANTED 50 MCG/0.5ML IM SUSR: 0.5000 mL | Freq: Once | INTRAMUSCULAR | 1 refills | Status: AC

## 2018-03-29 MED ORDER — ATORVASTATIN CALCIUM 20 MG PO TABS: ORAL_TABLET | ORAL | 2 refills | Status: DC

## 2018-03-29 MED ORDER — SODIUM BICARBONATE 650 MG PO TABS: 1300.0000 mg | ORAL_TABLET | Freq: Three times a day (TID) | ORAL | 2 refills | Status: DC

## 2018-03-29 MED ORDER — LABETALOL HCL 300 MG PO TABS: 300.0000 mg | ORAL_TABLET | Freq: Two times a day (BID) | ORAL | 2 refills | Status: DC

## 2018-03-29 NOTE — Progress Notes (Signed)
   Subjective:    Patient ID: [name], female    DOB: 04/24/1930, 84 y.o.   MRN: [id number]  Patient presents for Follow-up (is fasting)  Pt here to  f/u chronic medical problems  Medications reviewed No new concerns  She has been out of her eye drop, does not have with her, it kept her eyes from watering, occ matting, this has been worse past 2 months   OA- needs refill on ultram and voltaren gel, last steroid injection by orthopedics did not help and she does not want surgical intervention at her age which I agree with  HTN- takming BP meds as prescribed  CKD- has not seen nephrology recently , has hyperparthyroidism as well, needs repeat labs   Son at bedside    Review Of Systems:  GEN- denies fatigue, fever, weight loss,weakness, recent illness HEENT- denies eye drainage, change in vision, nasal discharge, CVS- denies chest pain, palpitations RESP- denies SOB, cough, wheeze ABD- denies N/V, change in stools, abd pain GU- denies dysuria, hematuria, dribbling, incontinence MSK-+joint pain, muscle aches, injury Neuro- denies headache, dizziness, syncope, seizure activity       Objective:    BP 138/84   Pulse 70   Temp 98.6 F (37 C) (Oral)   Resp 14   Ht 4\' 9"  (1.448 m)   Wt 150 lb (68 kg)   SpO2 97%   BMI 32.46 kg/m  GEN- NAD, alert and oriented x3 HEENT- PERRL, EOMI, non injected sclera, pink conjunctiva, MMM, oropharynx clear Neck- Supple, no thyromegaly, no JVD CVS- RRR, no murmur RESP-CTAB ABD-NABS,soft,NT,ND EXT- chronic ankle edema Pulses- Radial 2+, DP- 1+        Assessment & Plan:      Problem List Items Addressed This Visit      Unprioritized   Bilateral primary osteoarthritis of knee    Refilled ultram and voltaren gel  I agree high risk for surgery      Chronic kidney disease   Relevant Orders   Comprehensive metabolic panel (Completed)   Parathyroid hormone, intact (no Ca)   Essential hypertension - Primary    Controlled  for age, no changes For her CKD recheck metabolic panel Check PTH and calcium will send labs to her nephrologist in [place]       Relevant Medications   atorvastatin (LIPITOR) 20 MG tablet   labetalol (NORMODYNE) 300 MG tablet   Other Relevant Orders   CBC with Differential/Platelet (Completed)   Comprehensive metabolic panel (Completed)   Hyperparathyroidism ([place])   Relevant Orders   Parathyroid hormone, intact (no Ca)   Phosphorus (Completed)   Peripheral edema      Note: This dictation was prepared with Dragon dictation along with smaller phrase technology. Any transcriptional errors that result from this process are unintentional.

## 2018-03-29 NOTE — Telephone Encounter (Signed)
Received call from [place] in regards to tramadol prescription.   Prescription has 2 different instructions.   Take 1 tablet (50 mg total) by mouth 3 (three) times daily as needed. TAKE 1 TABLET(50 MG) BY MOUTH TWICE DAILY AS NEEDED FOR PAIN  MD please advise.

## 2018-03-29 NOTE — Patient Instructions (Addendum)
Reschedule your kidney appointment  Pain medications refilled for arthritis  Shingles vaccine sent to pharmacy  We will call with lab results  Call me with her eye drop  F/U 4 months

## 2018-03-30 ENCOUNTER — Encounter: Payer: Self-pay | Admitting: Family Medicine

## 2018-03-30 NOTE — Assessment & Plan Note (Signed)
Refilled ultram and voltaren gel  I agree high risk for surgery

## 2018-03-30 NOTE — Assessment & Plan Note (Signed)
Controlled for age, no changes For her CKD recheck metabolic panel Check PTH and calcium will send labs to her nephrologist in [place]

## 2018-04-01 ENCOUNTER — Telehealth: Payer: Self-pay | Admitting: *Deleted

## 2018-04-01 ENCOUNTER — Other Ambulatory Visit: Payer: Self-pay | Admitting: Family Medicine

## 2018-04-01 LAB — COMPREHENSIVE METABOLIC PANEL
AG Ratio: 1.6 (calc) (ref 1.0–2.5)
ALT: 17 U/L (ref 6–29)
AST: 22 U/L (ref 10–35)
Albumin: 3.9 g/dL (ref 3.6–5.1)
Alkaline phosphatase (APISO): 71 U/L (ref 33–130)
BUN/Creatinine Ratio: 15 (calc) (ref 6–22)
BUN: 24 mg/dL (ref 7–25)
CO2: 30 mmol/L (ref 20–32)
Calcium: 9.2 mg/dL (ref 8.6–10.4)
Chloride: 107 mmol/L (ref 98–110)
Creat: 1.59 mg/dL — ABNORMAL HIGH (ref 0.60–0.88)
Globulin: 2.4 g/dL (calc) (ref 1.9–3.7)
Glucose, Bld: 89 mg/dL (ref 65–99)
Potassium: 5 mmol/L (ref 3.5–5.3)
Sodium: 145 mmol/L (ref 135–146)
Total Bilirubin: 0.5 mg/dL (ref 0.2–1.2)
Total Protein: 6.3 g/dL (ref 6.1–8.1)

## 2018-04-01 LAB — CBC WITH DIFFERENTIAL/PLATELET
Absolute Monocytes: 644 cells/uL (ref 200–950)
Basophils Absolute: 52 cells/uL (ref 0–200)
Basophils Relative: 0.9 %
Eosinophils Absolute: 133 cells/uL (ref 15–500)
Eosinophils Relative: 2.3 %
HCT: 37.5 % (ref 35.0–45.0)
Hemoglobin: 12.2 g/dL (ref 11.7–15.5)
Lymphs Abs: 1804 cells/uL (ref 850–3900)
MCH: 31.4 pg (ref 27.0–33.0)
MCHC: 32.5 g/dL (ref 32.0–36.0)
MCV: 96.6 fL (ref 80.0–100.0)
MPV: 12.2 fL (ref 7.5–12.5)
Monocytes Relative: 11.1 %
Neutro Abs: 3167 cells/uL (ref 1500–7800)
Neutrophils Relative %: 54.6 %
Platelets: 208 10*3/uL (ref 140–400)
RBC: 3.88 10*6/uL (ref 3.80–5.10)
RDW: 12.6 % (ref 11.0–15.0)
Total Lymphocyte: 31.1 %
WBC: 5.8 10*3/uL (ref 3.8–10.8)

## 2018-04-01 LAB — PHOSPHORUS: Phosphorus: 2.9 mg/dL (ref 2.1–4.3)

## 2018-04-01 LAB — PARATHYROID HORMONE, INTACT (NO CA): PTH: 66 pg/mL — ABNORMAL HIGH (ref 14–64)

## 2018-04-01 NOTE — Telephone Encounter (Signed)
-----   Message from Alycia Rossetti, MD sent at 03/30/2018  7:12 AM EST ----- Regarding: Need name of her eye drop at home to refill

## 2018-04-01 NOTE — Telephone Encounter (Signed)
Call placed to patient daughter [name] to inquire. [place].

## 2018-04-01 NOTE — Telephone Encounter (Signed)
Noted, pt can get over the counter

## 2018-04-01 NOTE — Telephone Encounter (Signed)
Call placed to patient son.   Reports that patient eye drops were OTC Eye Itch Relief.

## 2018-07-17 ENCOUNTER — Other Ambulatory Visit: Payer: Self-pay | Admitting: Family Medicine

## 2018-07-25 ENCOUNTER — Ambulatory Visit (INDEPENDENT_AMBULATORY_CARE_PROVIDER_SITE_OTHER): Payer: Medicare HMO

## 2018-07-25 ENCOUNTER — Other Ambulatory Visit: Payer: Self-pay

## 2018-07-25 ENCOUNTER — Encounter (INDEPENDENT_AMBULATORY_CARE_PROVIDER_SITE_OTHER): Payer: Self-pay | Admitting: Orthopaedic Surgery

## 2018-07-25 ENCOUNTER — Ambulatory Visit (INDEPENDENT_AMBULATORY_CARE_PROVIDER_SITE_OTHER): Payer: Self-pay

## 2018-07-25 ENCOUNTER — Telehealth (INDEPENDENT_AMBULATORY_CARE_PROVIDER_SITE_OTHER): Payer: Self-pay | Admitting: Radiology

## 2018-07-25 ENCOUNTER — Ambulatory Visit (INDEPENDENT_AMBULATORY_CARE_PROVIDER_SITE_OTHER): Payer: Medicare HMO | Admitting: Orthopaedic Surgery

## 2018-07-25 DIAGNOSIS — M25561 Pain in right knee: Secondary | ICD-10-CM

## 2018-07-25 DIAGNOSIS — M25562 Pain in left knee: Secondary | ICD-10-CM

## 2018-07-25 DIAGNOSIS — G8929 Other chronic pain: Secondary | ICD-10-CM

## 2018-07-25 DIAGNOSIS — M25552 Pain in left hip: Secondary | ICD-10-CM

## 2018-07-25 MED ORDER — ACETAMINOPHEN-CODEINE #3 300-30 MG PO TABS: 1.0000 | ORAL_TABLET | Freq: Four times a day (QID) | ORAL | 2 refills | Status: DC | PRN

## 2018-07-25 NOTE — Telephone Encounter (Signed)
Noted  

## 2018-07-25 NOTE — Telephone Encounter (Signed)
Request approval for bilateral knee get injections.

## 2018-07-25 NOTE — Progress Notes (Signed)
Office Visit Note   Patient: [name]           Date of Birth: 1930-04-12           MRN: [id number] Visit Date: 07/25/2018              Requested by: Alycia Rossetti, MD [address] [place], [place] [address] PCP: Alycia Rossetti, MD   Assessment & Plan: Visit Diagnoses:  1. Chronic pain of right knee   2. Chronic pain of left knee     Plan:This patient is diagnosed with osteoarthritis of both knees.    Radiographs show evidence of joint space narrowing, osteophytes and subchondral sclerosis.  This patient has knee pain which interferes with functional and activities of daily living.    This patient has experienced inadequate response, adverse effects and/or intolerance with conservative treatments such as acetaminophen, NSAIDS, topical creams, physical therapy or regular exercise, knee bracing and/or weight loss.   This patient has experienced inadequate response or has a contraindication to intra articular steroid injections for at least 3 months.   This patient is not scheduled to have a total knee replacement within 6 months of starting treatment with viscosupplementation.  Follow-Up Instructions: Return for once approved for visco inj.   Orders:  Orders Placed This Encounter  Procedures  . XR KNEE 3 VIEW LEFT  . XR Knee 1-2 Views Right   Meds ordered this encounter  Medications  . acetaminophen-codeine (TYLENOL #3) 300-30 MG tablet    Sig: Take 1 tablet by mouth every 6 (six) hours as needed for moderate pain.    Dispense:  30 tablet    Refill:  2      Procedures: No procedures performed   Clinical Data: No additional findings.   Subjective: Chief Complaint  Patient presents with  . Right Knee - Pain  . Left Knee - Pain    HPI patient is a pleasant 85 year old female who presents our clinic today with bilateral knee pain left greater than right.  This is been ongoing for the past several years.  She has had cortisone injections in the  past with the last one being a few weeks ago.  She noticed 3 days of improvement.  Her pain has returned.  The pain she has is to the lateral aspect of the right knee and the medial aspect of the left knee.  She describes this as constant in nature worse with ambulation and activity.  She has pain at night that wakes her from sleep as well as pain that is affecting her ADLs.  She has been using topical and oral anti-inflammatories with very minimal relief of symptoms.  No previous viscosupplementation injection.  Of note, she is on Aggrenox for a stroke that she had approximately 10 years ago.  Review of Systems as detailed in HPI.  All others reviewed and are negative.   Objective: Vital Signs: There were no vitals taken for this visit.  Physical Exam well-developed and well-nourished female in no acute distress.  Alert and oriented x3.  Ortho Exam examination of both knees reveals valgus deformity.  Trace effusion both sides.  Medial and lateral joint line tenderness.  Moderate patellofemoral crepitus.  She is neurovascularly intact distally.  Specialty Comments:  No specialty comments available.  Imaging: Xr Knee 1-2 Views Right  Result Date: 07/25/2018 X-rays show severe tricompartmental degenerative changes  Xr Knee 3 View Left  Result Date: 07/25/2018 X-rays show severe tricompartmental  degenerative changes    PMFS History: Patient Active Problem List   Diagnosis Date Noted  . Chronic pain of left knee 07/25/2018  . Bilateral primary osteoarthritis of knee 11/09/2016  . History of stroke 06/02/2016  . Osteoarthritis 03/25/2014  . Anxiety and depression 02/20/2012  . Chronic headache 02/20/2012  . Peripheral edema 02/19/2012  . Hyperparathyroidism ([place]) 10/23/2008  . ANEMIA 10/23/2008  . Essential hypertension 10/23/2008  . GALLSTONE PANCREATITIS 10/23/2008  . Chronic kidney disease 10/23/2008  . DEGENERATIVE JOINT DISEASE 10/23/2008  . PROTEINURIA 10/23/2008   Past  Medical History:  Diagnosis Date  . Anxiety   . Arthritis   . CKD (chronic kidney disease)    Dr. Hinda Lenis  . Depression   . GERD (gastroesophageal reflux disease)   . Hyperlipidemia   . Hypertension   . Osteoporosis   . Stroke ([place])   . UTI (lower urinary tract infection)     Family History  Problem Relation Age of Onset  . Cancer Mother   . Cancer Father     Past Surgical History:  Procedure Laterality Date  . CHOLECYSTECTOMY     Social History   Occupational History  . Not on file  Tobacco Use  . Smoking status: Never Smoker  . Smokeless tobacco: Never Used  Substance and Sexual Activity  . Alcohol use: No  . Drug use: No  . Sexual activity: Never

## 2018-07-29 ENCOUNTER — Telehealth: Payer: Self-pay

## 2018-07-29 NOTE — Telephone Encounter (Signed)
Submitted VOB for Gel-One, bilateral knee.  

## 2018-08-02 ENCOUNTER — Ambulatory Visit (INDEPENDENT_AMBULATORY_CARE_PROVIDER_SITE_OTHER): Payer: Medicare HMO | Admitting: Family Medicine

## 2018-08-02 ENCOUNTER — Other Ambulatory Visit: Payer: Self-pay

## 2018-08-02 ENCOUNTER — Encounter: Payer: Self-pay | Admitting: Family Medicine

## 2018-08-02 DIAGNOSIS — F419 Anxiety disorder, unspecified: Secondary | ICD-10-CM | POA: Diagnosis not present

## 2018-08-02 DIAGNOSIS — I10 Essential (primary) hypertension: Secondary | ICD-10-CM | POA: Diagnosis not present

## 2018-08-02 DIAGNOSIS — F32A Depression, unspecified: Secondary | ICD-10-CM

## 2018-08-02 DIAGNOSIS — N183 Chronic kidney disease, stage 3 unspecified: Secondary | ICD-10-CM

## 2018-08-02 DIAGNOSIS — M8949 Other hypertrophic osteoarthropathy, multiple sites: Secondary | ICD-10-CM

## 2018-08-02 DIAGNOSIS — M150 Primary generalized (osteo)arthritis: Secondary | ICD-10-CM | POA: Diagnosis not present

## 2018-08-02 DIAGNOSIS — F329 Major depressive disorder, single episode, unspecified: Secondary | ICD-10-CM

## 2018-08-02 DIAGNOSIS — M159 Polyosteoarthritis, unspecified: Secondary | ICD-10-CM

## 2018-08-02 DIAGNOSIS — R69 Illness, unspecified: Secondary | ICD-10-CM | POA: Diagnosis not present

## 2018-08-02 MED ORDER — LORAZEPAM 1 MG PO TABS: 1.0000 mg | ORAL_TABLET | Freq: Every day | ORAL | 2 refills | Status: DC

## 2018-08-02 NOTE — Progress Notes (Signed)
Virtual Visit via Telephone Note  I connected with [name] on 08/02/18 at 12:40pm  by telephone and verified that I am speaking with the correct person using two identifiers.     Pt location: at home   Physician location: at home, [place], Vic Blackbird MD     On call: patient , daughter [name] and physician   I discussed the limitations, risks, security and privacy concerns of performing an evaluation and management service by telephone and the availability of in person appointments. I also discussed with the patient that there may be a patient responsible charge related to this service. The patient expressed understanding and agreed to proceed.   History of Present Illness: HTN- Bp has been well controlled   Seasonal allergies- taking zyrtec allergies   Hyperlipidemia- tolerating lipitor without difficulty  OA- taking ultram, was seen by orthopedist planning for Visco injection / Given tylenol #3 for severe pain- taking 1-2 a day   Iron def anemia one a day , seen recently by nephrology told her labs  Still taking bicarb and Vitamin D    GAD- overall doing well, doesn't sleep some times, she often takes 1/2 tablet at night, she does rest during the day   - refill [facility]  History of CVA- doing well on aggrenox, no blood stool or urine     Observations/Objective: Telephone visit, NAD   Assessment and Plan:  OA- ultram and tylenol #3, no extra tylenol to be taken with meds, does not use ultram and tylenol #3 together   HTN- BP has been well controlled  GAD/insomnia- prn ativan, family monitor this use   CKD with anemia- following with nephrology  Follow Up Instructions: Wed May 5th 10am for AWV   I discussed the assessment and treatment plan with the patient. The patient was provided an opportunity to ask questions and all were answered. The patient agreed with the plan and demonstrated an understanding of the  instructions.   The patient was advised to call back or seek an in-person evaluation if the symptoms worsen or if the condition fails to improve as anticipated.  I provided  17 minutes of non-face-to-face time during this encounter. End time 12:57pm  Vic Blackbird, MD

## 2018-08-06 ENCOUNTER — Telehealth: Payer: Self-pay

## 2018-08-06 NOTE — Telephone Encounter (Signed)
PA required for Gel-One, bilateral knee. Faxed completed PA form to [place] [facility] at [phone].

## 2018-08-07 ENCOUNTER — Telehealth: Payer: Self-pay

## 2018-08-07 NOTE — Telephone Encounter (Signed)
Talked with patient's daughter and advised her that patient is approved for gel injection.  Approved for Gel-One, bilateral knee. [place] Patient will be responsible for 20% OOP. Co-pay of $25.00 (required) PA required PA Approval# [id number] Valid 08/06/2018- 11/06/2018  Appt. 08/12/2018

## 2018-08-12 ENCOUNTER — Other Ambulatory Visit: Payer: Self-pay

## 2018-08-12 ENCOUNTER — Ambulatory Visit (INDEPENDENT_AMBULATORY_CARE_PROVIDER_SITE_OTHER): Payer: Medicare HMO | Admitting: Orthopaedic Surgery

## 2018-08-12 ENCOUNTER — Encounter: Payer: Self-pay | Admitting: Orthopaedic Surgery

## 2018-08-12 DIAGNOSIS — M170 Bilateral primary osteoarthritis of knee: Secondary | ICD-10-CM | POA: Diagnosis not present

## 2018-08-12 MED ORDER — CROSS-LINK HYAL ACID (VISC) 30 MG/3ML IX PRSY
30.0000 mg | PREFILLED_SYRINGE | INTRA_ARTICULAR | Status: AC | PRN
  Administered 2018-08-12: 30 mg via INTRA_ARTICULAR

## 2018-08-12 NOTE — Progress Notes (Signed)
   Procedure Note  Patient: [name]             Date of Birth: 1930/08/26           MRN: [id number]             Visit Date: 08/12/2018  Procedures: Visit Diagnoses: Bilateral primary osteoarthritis of knee  Large Joint Inj: bilateral knee on 08/12/2018 10:19 AM Indications: pain Details: 22 G needle  Arthrogram: No  Medications (Right): 30 mg Cross-Linked Hyaluronate 30 MG/3ML Medications (Left): 30 mg Cross-Linked Hyaluronate 30 MG/3ML Outcome: tolerated well, no immediate complications Patient was prepped and draped in the usual sterile fashion.

## 2018-08-23 DIAGNOSIS — R69 Illness, unspecified: Secondary | ICD-10-CM | POA: Diagnosis not present

## 2018-09-13 ENCOUNTER — Other Ambulatory Visit: Payer: Self-pay | Admitting: Family Medicine

## 2018-09-13 NOTE — Telephone Encounter (Signed)
Ok to refill??  Last office visit 08/02/2018.  Last refill 03/29/2018, #1 refill.

## 2018-11-27 ENCOUNTER — Other Ambulatory Visit: Payer: Self-pay | Admitting: Family Medicine

## 2018-11-27 ENCOUNTER — Other Ambulatory Visit (INDEPENDENT_AMBULATORY_CARE_PROVIDER_SITE_OTHER): Payer: Self-pay | Admitting: Physician Assistant

## 2018-12-05 ENCOUNTER — Telehealth: Payer: Self-pay | Admitting: Orthopaedic Surgery

## 2018-12-05 NOTE — Telephone Encounter (Signed)
Please advise 

## 2018-12-05 NOTE — Telephone Encounter (Signed)
Patient's daughter called requesting an RX refill on the patient's Tylenol #3.  Patient uses [facility] on [address].  CB#[phone].  Thank you.

## 2018-12-06 ENCOUNTER — Telehealth: Payer: Self-pay

## 2018-12-06 MED ORDER — ACETAMINOPHEN-CODEINE #3 300-30 MG PO TABS: 1.0000 | ORAL_TABLET | Freq: Four times a day (QID) | ORAL | 2 refills | Status: DC | PRN

## 2018-12-06 NOTE — Telephone Encounter (Signed)
Called to pharmacy. I called patient's daughter and advised.

## 2018-12-06 NOTE — Telephone Encounter (Signed)
Received fax from [facility] for rxrf for T#3  Ok to rf?

## 2018-12-06 NOTE — Telephone Encounter (Signed)
Ok to call in with same instructions as previous rx

## 2018-12-09 MED ORDER — ACETAMINOPHEN-CODEINE #3 300-30 MG PO TABS: 1.0000 | ORAL_TABLET | Freq: Four times a day (QID) | ORAL | 2 refills | Status: DC | PRN

## 2018-12-09 NOTE — Addendum Note (Signed)
Addended byLaurann [place] on: 12/09/2018 08:40 AM   Modules accepted: Orders

## 2018-12-09 NOTE — Telephone Encounter (Signed)
sure

## 2018-12-23 ENCOUNTER — Telehealth: Payer: Self-pay | Admitting: Family Medicine

## 2018-12-23 ENCOUNTER — Other Ambulatory Visit: Payer: Self-pay | Admitting: Family Medicine

## 2018-12-23 NOTE — Telephone Encounter (Signed)
Patient needs refill on tramadol [facility]

## 2018-12-23 NOTE — Telephone Encounter (Signed)
Last seen 08/02/2018 Last refilled: 09/13/2018

## 2018-12-24 NOTE — Telephone Encounter (Signed)
Medication sent to pharmacy on 9/29/220.

## 2019-01-28 ENCOUNTER — Ambulatory Visit (INDEPENDENT_AMBULATORY_CARE_PROVIDER_SITE_OTHER): Payer: Medicare HMO

## 2019-01-28 ENCOUNTER — Other Ambulatory Visit: Payer: Self-pay

## 2019-01-28 DIAGNOSIS — Z23 Encounter for immunization: Secondary | ICD-10-CM

## 2019-03-06 ENCOUNTER — Other Ambulatory Visit: Payer: Self-pay | Admitting: *Deleted

## 2019-03-06 MED ORDER — OMEPRAZOLE 20 MG PO CPDR: 20.0000 mg | DELAYED_RELEASE_CAPSULE | Freq: Every day | ORAL | 2 refills | Status: DC

## 2019-03-06 MED ORDER — ATORVASTATIN CALCIUM 20 MG PO TABS: ORAL_TABLET | ORAL | 2 refills | Status: DC

## 2019-03-26 ENCOUNTER — Other Ambulatory Visit (INDEPENDENT_AMBULATORY_CARE_PROVIDER_SITE_OTHER): Payer: Self-pay | Admitting: Physician Assistant

## 2019-04-01 ENCOUNTER — Other Ambulatory Visit: Payer: Self-pay | Admitting: *Deleted

## 2019-04-01 ENCOUNTER — Telehealth: Payer: Self-pay | Admitting: Orthopaedic Surgery

## 2019-04-01 MED ORDER — TRAMADOL HCL 50 MG PO TABS: ORAL_TABLET | ORAL | 1 refills | Status: DC

## 2019-04-01 NOTE — Telephone Encounter (Signed)
Patient's daughter Jarrett Ables called requesting a refill for Tylenol 3. Patient pharmacy is [facility] on [address]. [place] daughter phone number is [phone].

## 2019-04-01 NOTE — Telephone Encounter (Signed)
Received call from patient daughter, Jarrett Ables.   Requested refill on Tramadol.    Ok to refill??  Last office visit 08/02/2018.  Last refill 12/24/2018, #1 refill.

## 2019-04-02 ENCOUNTER — Other Ambulatory Visit: Payer: Self-pay | Admitting: Physician Assistant

## 2019-04-02 MED ORDER — ACETAMINOPHEN-CODEINE #3 300-30 MG PO TABS: 1.0000 | ORAL_TABLET | Freq: Four times a day (QID) | ORAL | 2 refills | Status: DC | PRN

## 2019-04-02 NOTE — Telephone Encounter (Signed)
Sent in

## 2019-04-09 ENCOUNTER — Other Ambulatory Visit: Payer: Self-pay | Admitting: Family Medicine

## 2019-07-08 ENCOUNTER — Other Ambulatory Visit: Payer: Self-pay | Admitting: Family Medicine

## 2019-07-15 ENCOUNTER — Other Ambulatory Visit: Payer: Self-pay | Admitting: Family Medicine

## 2019-07-15 NOTE — Telephone Encounter (Signed)
Last office visit: 08/02/2018 Last refill:  04/01/2019

## 2019-08-06 ENCOUNTER — Telehealth: Payer: Self-pay | Admitting: Orthopaedic Surgery

## 2019-08-06 MED ORDER — ACETAMINOPHEN-CODEINE #3 300-30 MG PO TABS: 1.0000 | ORAL_TABLET | Freq: Every day | ORAL | 0 refills | Status: DC | PRN

## 2019-08-06 NOTE — Telephone Encounter (Signed)
Pt called asking a refill on her tylenol 3; pt would like a call back when it's been sent in.   [phone]

## 2019-08-12 ENCOUNTER — Other Ambulatory Visit: Payer: Self-pay | Admitting: Family Medicine

## 2019-08-12 ENCOUNTER — Other Ambulatory Visit: Payer: Self-pay

## 2019-08-12 ENCOUNTER — Encounter: Payer: Self-pay | Admitting: Family Medicine

## 2019-08-12 ENCOUNTER — Ambulatory Visit (INDEPENDENT_AMBULATORY_CARE_PROVIDER_SITE_OTHER): Payer: Medicare HMO | Admitting: Family Medicine

## 2019-08-12 VITALS — BP 142/78 | HR 66 | Temp 97.9°F | Resp 14 | Ht <= 58 in | Wt 155.0 lb

## 2019-08-12 DIAGNOSIS — Z8673 Personal history of transient ischemic attack (TIA), and cerebral infarction without residual deficits: Secondary | ICD-10-CM

## 2019-08-12 DIAGNOSIS — R7309 Other abnormal glucose: Secondary | ICD-10-CM | POA: Diagnosis not present

## 2019-08-12 DIAGNOSIS — K219 Gastro-esophageal reflux disease without esophagitis: Secondary | ICD-10-CM | POA: Insufficient documentation

## 2019-08-12 DIAGNOSIS — I10 Essential (primary) hypertension: Secondary | ICD-10-CM

## 2019-08-12 DIAGNOSIS — E213 Hyperparathyroidism, unspecified: Secondary | ICD-10-CM | POA: Diagnosis not present

## 2019-08-12 DIAGNOSIS — M170 Bilateral primary osteoarthritis of knee: Secondary | ICD-10-CM

## 2019-08-12 DIAGNOSIS — N1831 Chronic kidney disease, stage 3a: Secondary | ICD-10-CM

## 2019-08-12 DIAGNOSIS — E7439 Other disorders of intestinal carbohydrate absorption: Secondary | ICD-10-CM | POA: Diagnosis not present

## 2019-08-12 DIAGNOSIS — D631 Anemia in chronic kidney disease: Secondary | ICD-10-CM

## 2019-08-12 DIAGNOSIS — R350 Frequency of micturition: Secondary | ICD-10-CM

## 2019-08-12 LAB — URINALYSIS, ROUTINE W REFLEX MICROSCOPIC
Bilirubin Urine: NEGATIVE
Glucose, UA: NEGATIVE
Ketones, ur: NEGATIVE
Nitrite: NEGATIVE
Specific Gravity, Urine: 1.015 (ref 1.001–1.03)
pH: 6 (ref 5.0–8.0)

## 2019-08-12 LAB — MICROSCOPIC MESSAGE

## 2019-08-12 MED ORDER — ATORVASTATIN CALCIUM 20 MG PO TABS: ORAL_TABLET | ORAL | 2 refills | Status: DC

## 2019-08-12 MED ORDER — TRAMADOL HCL 50 MG PO TABS: 50.0000 mg | ORAL_TABLET | Freq: Four times a day (QID) | ORAL | 1 refills | Status: DC | PRN

## 2019-08-12 MED ORDER — AMLODIPINE BESYLATE 10 MG PO TABS: 10.0000 mg | ORAL_TABLET | Freq: Every day | ORAL | 2 refills | Status: DC

## 2019-08-12 MED ORDER — ACETAMINOPHEN-CODEINE #3 300-30 MG PO TABS: 1.0000 | ORAL_TABLET | Freq: Four times a day (QID) | ORAL | 2 refills | Status: DC | PRN

## 2019-08-12 MED ORDER — LABETALOL HCL 300 MG PO TABS: ORAL_TABLET | ORAL | 0 refills | Status: DC

## 2019-08-12 MED ORDER — CEPHALEXIN 500 MG PO CAPS
500.0000 mg | ORAL_CAPSULE | Freq: Two times a day (BID) | ORAL | 0 refills | Status: DC
Start: 2019-08-12 — End: 2019-09-03

## 2019-08-12 NOTE — Assessment & Plan Note (Signed)
She is not recall she is still using her PPI.  Her daughters will check on this when he gets home.  She denies any active symptoms.

## 2019-08-12 NOTE — Telephone Encounter (Signed)
Labetalol has already been filled.   Ok to fill APAP ## and Tramadol?

## 2019-08-12 NOTE — Patient Instructions (Addendum)
Medications refilled Check box to see if she is still taking omeprazole  Urine sent for culture Start the antibiotics  F/U Oct for wellness visit

## 2019-08-12 NOTE — Telephone Encounter (Signed)
CB# [phone] Tonie (Daughter) came back with her mother's medication refill Labetalol,Tramadol, Tylenol#3

## 2019-08-12 NOTE — Progress Notes (Signed)
Subjective:    Patient ID: [name], female    DOB: [birth year], 86 y.o.   MRN: [id number]  Patient presents for B Knee Pain and Urinary Frequency (states that she has no burning or discomfort with urination, but has increased frequency) Pt here with daughter Katharine Look Patient here to follow-up chronic medical problems.  Her last visit was in May secondary to COVID-19 pandemic.  Medications reviewed.  Hypertension- taking bp meds without any SE  Chronic osteoarthritis bilateral knees-she had gel injections last year  With Dr. Erlinda Hong.  She also takes tramadol as needed and she was given Tylenol 3 for severe pain She states gel shots did not help, she takes tylenol #3 in morning if severe pain  Hyperlipidemia she is taking Lipitor without any side effects  Chronic kidney disease with anemia she is followed by nephrology she is on bicarb vitamin D  she is on iron tablets  History of CVA she has not had any abnormal bleeding with Aggrenox   Anxiety with insomnia she takes low-dose of lorazepam as needed but does not take very often   Urinary frequeny for months, feels like she dribbles at times, she has had a few accidents, denies any odor or pressure  Review Of Systems:  GEN- denies fatigue, fever, weight loss,weakness, recent illness HEENT- denies eye drainage, change in vision, nasal discharge, CVS- denies chest pain, palpitations RESP- denies SOB, cough, wheeze ABD- denies N/V, change in stools, abd pain GU- denies dysuria, hematuria, +dribbling, I+ncontinence MSK- +joint pain, muscle aches, injury Neuro- denies headache, dizziness, syncope, seizure activity       Objective:    BP (!) 142/78   Pulse 66   Temp 97.9 F (36.6 C) (Temporal)   Resp 14   Ht 4\' 9"  (1.448 m)   Wt 155 lb (70.3 kg)   SpO2 98%   BMI 33.54 kg/m  GEN- NAD, alert and oriented x3 HEENT- PERRL, EOMI, non injected sclera, pink conjunctiva, MMM, oropharynx clear Neck- Supple, no thyromegaly, no  JVD CVS- RRR, no murmur RESP-CTAB ABD-NABS,soft,NT,ND, no CVA tenderness MSK- bilat knees wearing braces, no effusion, fair ROM, +crepitus  EXT- chronic ankle edema Pulses- Radial 2+, DP- 1+     Assessment & Plan:      Problem List Items Addressed This Visit      Unprioritized   ANEMIA   Relevant Orders   Iron   Bilateral primary osteoarthritis of knee    Refill tramadol and Tylenol 3 her family member assistance of medication.  SHe is not a good surgical candidate.      Chronic kidney disease   Essential hypertension - Primary    Blood pressure looks okay no changes to current medications.  We will check renal function she has not followed up with nephrology in the setting of COVID-19 pandemic.  She does have hyperparathyroidism so we need to check her PTH levels calcium and vitamin D.  Also has underlying iron deficiency anemia we will check an iron level.  History of glucose intolerance she is due for repeat A1c especially with the urinary frequency.   Urinalysis showed some leukocytes will send for culture microscopy there was not enough urine to do both of these.  As she is leaving to go out of town with her daughter to [place] on Thursday morning the culture will not be back so we will go ahead and start her on Keflex 500 mg twice a day we will discontinue if needed.  Relevant Medications   amLODipine (NORVASC) 10 MG tablet   atorvastatin (LIPITOR) 20 MG tablet   labetalol (NORMODYNE) 300 MG tablet   Other Relevant Orders   CBC with Differential/Platelet   Comprehensive metabolic panel   Lipid panel   GERD (gastroesophageal reflux disease)    She is not recall she is still using her PPI.  Her daughters will check on this when he gets home.  She denies any active symptoms.      History of stroke   Relevant Orders   Lipid panel   Hyperparathyroidism ([place])   Relevant Orders   PTH, Intact and Calcium   Vitamin D, 25-hydroxy    Other Visit Diagnoses     Urinary frequency       Other differentials include overactive bladder, diabetes mellitus   Relevant Orders   Urinalysis, Routine w reflex microscopic (Completed)   Urine Culture   Glucose intolerance       Relevant Orders   Hemoglobin A1c      Note: This dictation was prepared with Dragon dictation along with smaller phrase technology. Any transcriptional errors that result from this process are unintentional.

## 2019-08-12 NOTE — Assessment & Plan Note (Signed)
Refill tramadol and Tylenol 3 her family member assistance of medication.  SHe is not a good surgical candidate.

## 2019-08-12 NOTE — Assessment & Plan Note (Signed)
Blood pressure looks okay no changes to current medications.  We will check renal function she has not followed up with nephrology in the setting of COVID-19 pandemic.  She does have hyperparathyroidism so we need to check her PTH levels calcium and vitamin D.  Also has underlying iron deficiency anemia we will check an iron level.  History of glucose intolerance she is due for repeat A1c especially with the urinary frequency.   Urinalysis showed some leukocytes will send for culture microscopy there was not enough urine to do both of these.  As she is leaving to go out of town with her daughter to [place] on Thursday morning the culture will not be back so we will go ahead and start her on Keflex 500 mg twice a day we will discontinue if needed.

## 2019-08-13 LAB — COMPREHENSIVE METABOLIC PANEL
AG Ratio: 1.2 (calc) (ref 1.0–2.5)
ALT: 16 U/L (ref 6–29)
AST: 20 U/L (ref 10–35)
Albumin: 3.7 g/dL (ref 3.6–5.1)
Alkaline phosphatase (APISO): 74 U/L (ref 37–153)
BUN/Creatinine Ratio: 19 (calc) (ref 6–22)
BUN: 34 mg/dL — ABNORMAL HIGH (ref 7–25)
CO2: 26 mmol/L (ref 20–32)
Calcium: 9.4 mg/dL (ref 8.6–10.4)
Chloride: 108 mmol/L (ref 98–110)
Creat: 1.8 mg/dL — ABNORMAL HIGH (ref 0.60–0.88)
Globulin: 3 g/dL (calc) (ref 1.9–3.7)
Glucose, Bld: 105 mg/dL — ABNORMAL HIGH (ref 65–99)
Potassium: 4.6 mmol/L (ref 3.5–5.3)
Sodium: 144 mmol/L (ref 135–146)
Total Bilirubin: 0.7 mg/dL (ref 0.2–1.2)
Total Protein: 6.7 g/dL (ref 6.1–8.1)

## 2019-08-13 LAB — CBC WITH DIFFERENTIAL/PLATELET
Absolute Monocytes: 730 cells/uL (ref 200–950)
Basophils Absolute: 58 cells/uL (ref 0–200)
Basophils Relative: 0.9 %
Eosinophils Absolute: 211 cells/uL (ref 15–500)
Eosinophils Relative: 3.3 %
HCT: 38.4 % (ref 35.0–45.0)
Hemoglobin: 12.5 g/dL (ref 11.7–15.5)
Lymphs Abs: 1600 cells/uL (ref 850–3900)
MCH: 30.8 pg (ref 27.0–33.0)
MCHC: 32.6 g/dL (ref 32.0–36.0)
MCV: 94.6 fL (ref 80.0–100.0)
MPV: 12.4 fL (ref 7.5–12.5)
Monocytes Relative: 11.4 %
Neutro Abs: 3802 cells/uL (ref 1500–7800)
Neutrophils Relative %: 59.4 %
Platelets: 199 10*3/uL (ref 140–400)
RBC: 4.06 10*6/uL (ref 3.80–5.10)
RDW: 12.9 % (ref 11.0–15.0)
Total Lymphocyte: 25 %
WBC: 6.4 10*3/uL (ref 3.8–10.8)

## 2019-08-13 LAB — PTH, INTACT AND CALCIUM
Calcium: 9.4 mg/dL (ref 8.6–10.4)
PTH: 63 pg/mL (ref 14–64)

## 2019-08-13 LAB — VITAMIN D 25 HYDROXY (VIT D DEFICIENCY, FRACTURES): Vit D, 25-Hydroxy: 62 ng/mL (ref 30–100)

## 2019-08-13 LAB — LIPID PANEL
Cholesterol: 141 mg/dL (ref <200)
HDL: 57 mg/dL (ref >=50)
LDL Cholesterol (Calc): 67 mg/dL (calc)
Non-HDL Cholesterol (Calc): 84 mg/dL (calc) (ref <130)
Total CHOL/HDL Ratio: 2.5 (calc) (ref <5.0)
Triglycerides: 88 mg/dL (ref <150)

## 2019-08-13 LAB — IRON: Iron: 68 ug/dL (ref 45–160)

## 2019-08-13 LAB — HEMOGLOBIN A1C
Hgb A1c MFr Bld: 5.7 % of total Hgb — ABNORMAL HIGH (ref <5.7)
Mean Plasma Glucose: 117 (calc)
eAG (mmol/L): 6.5 (calc)

## 2019-08-14 DIAGNOSIS — I69328 Other speech and language deficits following cerebral infarction: Secondary | ICD-10-CM | POA: Diagnosis not present

## 2019-08-14 DIAGNOSIS — I10 Essential (primary) hypertension: Secondary | ICD-10-CM | POA: Diagnosis not present

## 2019-08-14 DIAGNOSIS — M1990 Unspecified osteoarthritis, unspecified site: Secondary | ICD-10-CM | POA: Diagnosis not present

## 2019-08-14 DIAGNOSIS — G8929 Other chronic pain: Secondary | ICD-10-CM | POA: Diagnosis not present

## 2019-08-14 DIAGNOSIS — I739 Peripheral vascular disease, unspecified: Secondary | ICD-10-CM | POA: Diagnosis not present

## 2019-08-14 DIAGNOSIS — K219 Gastro-esophageal reflux disease without esophagitis: Secondary | ICD-10-CM | POA: Diagnosis not present

## 2019-08-14 DIAGNOSIS — R69 Illness, unspecified: Secondary | ICD-10-CM | POA: Diagnosis not present

## 2019-08-14 DIAGNOSIS — K5900 Constipation, unspecified: Secondary | ICD-10-CM | POA: Diagnosis not present

## 2019-08-14 DIAGNOSIS — Z008 Encounter for other general examination: Secondary | ICD-10-CM | POA: Diagnosis not present

## 2019-08-14 DIAGNOSIS — E785 Hyperlipidemia, unspecified: Secondary | ICD-10-CM | POA: Diagnosis not present

## 2019-08-14 DIAGNOSIS — R32 Unspecified urinary incontinence: Secondary | ICD-10-CM | POA: Diagnosis not present

## 2019-08-14 LAB — URINE CULTURE
MICRO NUMBER:: 10490802
SPECIMEN QUALITY:: ADEQUATE

## 2019-09-03 ENCOUNTER — Other Ambulatory Visit: Payer: Self-pay

## 2019-09-03 ENCOUNTER — Ambulatory Visit (INDEPENDENT_AMBULATORY_CARE_PROVIDER_SITE_OTHER): Payer: Medicare HMO | Admitting: Nurse Practitioner

## 2019-09-03 VITALS — BP 130/68 | HR 64 | Temp 97.6°F | Resp 18 | Wt 154.4 lb

## 2019-09-03 DIAGNOSIS — W19XXXA Unspecified fall, initial encounter: Secondary | ICD-10-CM

## 2019-09-03 DIAGNOSIS — S00202A Unspecified superficial injury of left eyelid and periocular area, initial encounter: Secondary | ICD-10-CM

## 2019-09-03 NOTE — Progress Notes (Signed)
Established Patient Office Visit  Subjective:  Patient ID: [name], female    DOB: [birth year]  Age: 86 y.o. MRN: [id number]  CC:  Chief Complaint  Patient presents with  . Fall    06/3, fell going into bathroom on the rock tile, hit L eye on rug, bruising over L eye and forehead pain, pain meds taken    HPI [name] is a 86 year old female accompanied by her daughter for recent fall. The pt feel at home 1 week ago (last Thursday). She lives with her dgtr. The fall was not witnessed. The pt reports wearing socks and the sock got caught on the floor transition pice and caused her to trip. She hit her left eyebrow which caused minimal swelling no bruising or laceration. She denied losing consciousness. The fall was not witnessed. Since the fall the pt denied other known injury, soreness, discomfort, pain, bruising, headache, increased tiredness and the daughter denied noting changes in the pts level of wake/sleep pattern and behavior as well.   Fall precautions/education reviewed with pt and her dgtr.  Red flags to watch for and when to seek medical attention where provided with printed education.  Past Medical History:  Diagnosis Date  . Anxiety   . Arthritis   . CKD (chronic kidney disease)    Dr. Hinda Lenis  . Depression   . GERD (gastroesophageal reflux disease)   . Hyperlipidemia   . Hypertension   . Osteoporosis   . Stroke ([place])   . UTI (lower urinary tract infection)     Past Surgical History:  Procedure Laterality Date  . CHOLECYSTECTOMY      Family History  Problem Relation Age of Onset  . Cancer Mother   . Cancer Father     Social History   Socioeconomic History  . Marital status: Widowed    Spouse name: Not on file  . Number of children: Not on file  . Years of education: Not on file  . Highest education level: Not on file  Occupational History  . Not on file  Tobacco Use  . Smoking status: Never Smoker  . Smokeless tobacco: Never  Used  Substance and Sexual Activity  . Alcohol use: No  . Drug use: No  . Sexual activity: Never  Other Topics Concern  . Not on file  Social History Narrative  . Not on file   Social Determinants of Health   Financial Resource Strain:   . Difficulty of Paying Living Expenses:   Food Insecurity:   . Worried About Charity fundraiser in the Last Year:   . Arboriculturist in the Last Year:   Transportation Needs:   . Film/video editor (Medical):   Marland Kitchen Lack of Transportation (Non-Medical):   Physical Activity:   . Days of Exercise per Week:   . Minutes of Exercise per Session:   Stress:   . Feeling of Stress :   Social Connections:   . Frequency of Communication with Friends and Family:   . Frequency of Social Gatherings with Friends and Family:   . Attends Religious Services:   . Active Member of Clubs or Organizations:   . Attends Archivist Meetings:   Marland Kitchen Marital Status:   Intimate Partner Violence:   . Fear of Current or Ex-Partner:   . Emotionally Abused:   Marland Kitchen Physically Abused:   . Sexually Abused:     Outpatient Medications Prior to Visit  Medication Sig  Dispense Refill  . acetaminophen-codeine (TYLENOL #3) 300-30 MG tablet Take 1 tablet by mouth every 6 (six) hours as needed for moderate pain. 30 tablet 2  . albuterol (PROVENTIL HFA;VENTOLIN HFA) 108 (90 BASE) MCG/ACT inhaler Inhale 2 puffs into the lungs every 6 (six) hours as needed (wheezing/cough). 1 Inhaler 0  . amLODipine (NORVASC) 10 MG tablet Take 1 tablet (10 mg total) by mouth daily. 90 tablet 2  . aspirin EC 81 MG tablet Take 81 mg by mouth daily.    Marland Kitchen atorvastatin (LIPITOR) 20 MG tablet TAKE 1 TABLET(20 MG) BY MOUTH DAILY 90 tablet 2  . cetirizine (ZYRTEC) 10 MG tablet Take 10 mg by mouth daily.    . Cranberry-Cholecalciferol 4200-500 MG-UNIT CAPS Take 1 tablet by mouth daily.    . diclofenac sodium (VOLTAREN) 1 % GEL Apply dime size to bilateral knees three times a day as needed 100 g 11    . dipyridamole-aspirin (AGGRENOX) 200-25 MG 12hr capsule TAKE ONE CAPSULE BY MOUTH TWICE DAILY, EVERY 12 HOURS 180 capsule 2  . fluticasone (FLONASE) 50 MCG/ACT nasal spray Place 2 sprays into both nostrils daily as needed for allergies or rhinitis. 16 g 2  . iron polysaccharides (NIFEREX) 150 MG capsule Take 150 mg by mouth daily.    Marland Kitchen labetalol (NORMODYNE) 300 MG tablet TAKE 1 TABLET(300 MG) BY MOUTH TWICE DAILY 180 tablet 0  . LORazepam (ATIVAN) 1 MG tablet Take 1 tablet (1 mg total) by mouth at bedtime. 30 tablet 2  . Omega-3 Fatty Acids (OMEGA-3 FISH OIL PO) Take by mouth.    Marland Kitchen omeprazole (PRILOSEC) 20 MG capsule Take 1 capsule (20 mg total) by mouth daily. 90 capsule 2  . sodium bicarbonate 650 MG tablet TAKE 2 TABLETS(1300 MG) BY MOUTH THREE TIMES DAILY 540 tablet 1  . traMADol (ULTRAM) 50 MG tablet Take 1 tablet (50 mg total) by mouth every 6 (six) hours as needed. 90 tablet 1  . triamcinolone ointment (KENALOG) 0.1 % Apply to mouth sore twice a day as needed for 1 week 30 g 0  . Vitamin D, Cholecalciferol, 1000 UNITS TABS Take 1 tablet by mouth daily.    . meclizine (ANTIVERT) 12.5 MG tablet Take 1 tablet (12.5 mg total) by mouth 3 (three) times daily as needed for dizziness. (Patient not taking: Reported on 09/03/2019) 20 tablet 1  . cephALEXin (KEFLEX) 500 MG capsule Take 1 capsule (500 mg total) by mouth 2 (two) times daily. 14 capsule 0   No facility-administered medications prior to visit.    Allergies  Allergen Reactions  . Ciprofloxacin     Acute renal failure  . Diphenhydramine Hcl     REACTION: UNKNOWN REACTION  . Sulfonamide Derivatives Rash    ROS Review of Systems  All other systems reviewed and are negative.     Objective:    Physical Exam  Constitutional: She is oriented to person, place, and time. She appears well-developed and well-nourished.  Non-toxic appearance. She does not have a sickly appearance. She does not appear ill. No distress.  HENT:  Head:  Normocephalic.  Right Ear: Hearing normal.  Left Ear: Hearing and ear canal normal.  Nose: Nose normal.  Mouth/Throat: Oropharynx is clear and moist.  Eyes: Pupils are equal, round, and reactive to light. Conjunctivae and EOM are normal.  Neck: No JVD present.  Cardiovascular: Normal rate.  Pulmonary/Chest: Effort normal.  Musculoskeletal:        General: No edema. Normal range of motion.  Cervical back: Normal range of motion.  Neurological: She is alert and oriented to person, place, and time. She has normal strength and normal reflexes. She displays normal reflexes. No cranial nerve deficit. She exhibits normal muscle tone. She displays a negative Romberg sign. Coordination normal. GCS eye subscore is 4. GCS verbal subscore is 5. GCS motor subscore is 6.  Skin: Skin is warm. No rash noted. No erythema. No pallor.  Psychiatric: She has a normal mood and affect. Her speech is normal and behavior is normal. Judgment and thought content normal. Cognition and memory are normal.  Nursing note and vitals reviewed.   BP 130/68 (BP Location: Left Arm, Patient Position: Sitting, Cuff Size: Normal)   Pulse 64   Temp 97.6 F (36.4 C) (Temporal)   Resp 18   Wt 154 lb 6.4 oz (70 kg)   SpO2 95%   BMI 33.41 kg/m  Wt Readings from Last 3 Encounters:  09/03/19 154 lb 6.4 oz (70 kg)  08/12/19 155 lb (70.3 kg)  03/29/18 150 lb (68 kg)     Health Maintenance Due  Topic Date Due  . TETANUS/TDAP  Never done  . URINE MICROALBUMIN  01/10/2015    There are no preventive care reminders to display for this patient.   Lab Results  Component Value Date   WBC 6.4 08/12/2019   HGB 12.5 08/12/2019   HCT 38.4 08/12/2019   MCV 94.6 08/12/2019   PLT 199 08/12/2019   Lab Results  Component Value Date   NA 144 08/12/2019   K 4.6 08/12/2019   CO2 26 08/12/2019   GLUCOSE 105 (H) 08/12/2019   BUN 34 (H) 08/12/2019   CREATININE 1.80 (H) 08/12/2019   BILITOT 0.7 08/12/2019   ALKPHOS 71  10/25/2016   AST 20 08/12/2019   ALT 16 08/12/2019   PROT 6.7 08/12/2019   ALBUMIN 3.8 10/25/2016   CALCIUM 9.4 08/12/2019   CALCIUM 9.4 08/12/2019   ANIONGAP 7 08/09/2015   Lab Results  Component Value Date   CHOL 141 08/12/2019   Lab Results  Component Value Date   HDL 57 08/12/2019   Lab Results  Component Value Date   LDLCALC 67 08/12/2019   Lab Results  Component Value Date   TRIG 88 08/12/2019   Lab Results  Component Value Date   CHOLHDL 2.5 08/12/2019   Lab Results  Component Value Date   HGBA1C 5.7 (H) 08/12/2019      Assessment & Plan:   Problem List Items Addressed This Visit    None    Visit Diagnoses    Fall, initial encounter    -  Primary       Follow-up: Return in about 4 days (around 09/07/2019) for fall follow up.    Annie Main, FNP

## 2019-09-08 ENCOUNTER — Encounter: Payer: Self-pay | Admitting: Family Medicine

## 2019-09-08 ENCOUNTER — Other Ambulatory Visit: Payer: Self-pay

## 2019-09-08 ENCOUNTER — Ambulatory Visit: Payer: Self-pay

## 2019-09-08 ENCOUNTER — Ambulatory Visit: Payer: Medicare HMO | Admitting: Family Medicine

## 2019-09-08 VITALS — Ht <= 58 in | Wt 154.0 lb

## 2019-09-08 DIAGNOSIS — M25561 Pain in right knee: Secondary | ICD-10-CM

## 2019-09-08 DIAGNOSIS — M25562 Pain in left knee: Secondary | ICD-10-CM | POA: Diagnosis not present

## 2019-09-08 DIAGNOSIS — G8929 Other chronic pain: Secondary | ICD-10-CM

## 2019-09-08 NOTE — Progress Notes (Signed)
Office Visit Note   Patient: [name]           Date of Birth: 21-Nov-1930           MRN: [id number] Visit Date: 09/08/2019 Requested by: Alycia Rossetti, MD [address] [place],  [place] [address] PCP: Alycia Rossetti, MD  Subjective: No chief complaint on file.   HPI: She is here with bilateral knee pain.  History of end-stage lateral compartment DJD in both knees.  Over the years she has had cortisone injections and gel injections.  Cortisone seems to help more than the gel injections.  Both knees are bothering her about equally.  Pain is mainly with weightbearing.              ROS:   All other systems were reviewed and are negative.  Objective: Vital Signs: Ht 4\' 9"  (1.448 m)   Wt 154 lb (69.9 kg)   BMI 33.33 kg/m   Physical Exam:  General:  Alert and oriented, in no acute distress. Pulm:  Breathing unlabored. Psy:  Normal mood, congruent affect.  Knees: She has 1+ effusion in both knees with no warmth.  Tenderness over the medial and lateral joint lines, no pain to palpation of the patellofemoral joint.  1+ patellofemoral crepitus.  Imaging: XR Knee 1-2 Views Left  Result Date: 09/08/2019 Left knee x-ray reveals tricompartmental DJD with bone-on-bone lateral compartment narrowing.  XR Knee 1-2 Views Right  Result Date: 09/08/2019 X-rays right knee reveal tricompartmental DJD with moderate to severe lateral compartment narrowing.   Assessment & Plan: 1.  Bilateral knee DJD -Discussed options with patient and her daughter, elected to inject with cortisone today.  In the future she is interested in possibility of PRP injections.  She will follow-up as needed for this.     Procedures: Bilateral knee steroid injections: After sterile prep with Betadine, injected 5 cc 1% lidocaine without epinephrine and 40 mg methylprednisolone from superolateral approach into each knee.  A flash of clear yellow synovial fluid was obtained prior to each  injection.    PMFS History: Patient Active Problem List   Diagnosis Date Noted  . GERD (gastroesophageal reflux disease) 08/12/2019  . Bilateral primary osteoarthritis of knee 11/09/2016  . History of stroke 06/02/2016  . Osteoarthritis 03/25/2014  . Anxiety and depression 02/20/2012  . Chronic headache 02/20/2012  . Peripheral edema 02/19/2012  . Hyperparathyroidism ([place]) 10/23/2008  . ANEMIA 10/23/2008  . Essential hypertension 10/23/2008  . GALLSTONE PANCREATITIS 10/23/2008  . Chronic kidney disease 10/23/2008  . DEGENERATIVE JOINT DISEASE 10/23/2008  . PROTEINURIA 10/23/2008   Past Medical History:  Diagnosis Date  . Anxiety   . Arthritis   . CKD (chronic kidney disease)    Dr. Hinda Lenis  . Depression   . GERD (gastroesophageal reflux disease)   . Hyperlipidemia   . Hypertension   . Osteoporosis   . Stroke ([place])   . UTI (lower urinary tract infection)     Family History  Problem Relation Age of Onset  . Cancer Mother   . Cancer Father     Past Surgical History:  Procedure Laterality Date  . CHOLECYSTECTOMY     Social History   Occupational History  . Not on file  Tobacco Use  . Smoking status: Never Smoker  . Smokeless tobacco: Never Used  Substance and Sexual Activity  . Alcohol use: No  . Drug use: No  . Sexual activity: Never

## 2019-09-08 NOTE — Progress Notes (Signed)
Bilateral knee pain. Had gel injections in the knee 08/12/18. She states the cortisone has helped more in the past than the gel. She states right knee is worse. Daughter states she fell 2 weeks ago and her knees have been bothering her more since. +swelling

## 2019-09-15 ENCOUNTER — Other Ambulatory Visit: Payer: Self-pay | Admitting: Family Medicine

## 2019-09-15 MED ORDER — OMEPRAZOLE 20 MG PO CPDR: 20.0000 mg | DELAYED_RELEASE_CAPSULE | Freq: Every day | ORAL | 2 refills | Status: DC

## 2019-09-22 ENCOUNTER — Other Ambulatory Visit: Payer: Self-pay | Admitting: Family Medicine

## 2019-10-07 ENCOUNTER — Other Ambulatory Visit: Payer: Self-pay | Admitting: Family Medicine

## 2019-11-04 ENCOUNTER — Other Ambulatory Visit: Payer: Self-pay

## 2019-11-04 ENCOUNTER — Ambulatory Visit (HOSPITAL_COMMUNITY)
Admission: RE | Admit: 2019-11-04 | Discharge: 2019-11-04 | Disposition: A | Discharge status: Home / Self Care | Payer: Medicare HMO | Source: Ambulatory Visit | Attending: Family Medicine | Admitting: Family Medicine

## 2019-11-04 ENCOUNTER — Encounter: Payer: Self-pay | Admitting: Family Medicine

## 2019-11-04 ENCOUNTER — Ambulatory Visit (INDEPENDENT_AMBULATORY_CARE_PROVIDER_SITE_OTHER): Payer: Medicare HMO | Admitting: Family Medicine

## 2019-11-04 VITALS — BP 126/70 | HR 64 | Temp 98.8°F | Resp 14 | Ht <= 58 in | Wt 154.0 lb

## 2019-11-04 DIAGNOSIS — M542 Cervicalgia: Secondary | ICD-10-CM

## 2019-11-04 DIAGNOSIS — M170 Bilateral primary osteoarthritis of knee: Secondary | ICD-10-CM

## 2019-11-04 NOTE — Patient Instructions (Addendum)
Use the volataren gel or biofreeze Continue pain meds Get the xray done Call Dr. Junius Roads for the new shots F/U as previous

## 2019-11-04 NOTE — Assessment & Plan Note (Signed)
F/U ortho, continue ultram/tylenol #3

## 2019-11-04 NOTE — Progress Notes (Signed)
   Subjective:    Patient ID: [name], female    DOB: Sep 24, 1930, 86 y.o.   MRN: [id number]  Patient presents for B Knee Pain and Neck Pain (upper back/ lower neck pain)  Pt here with family She has chronic knee pain, reviewed ortho note, had steroid injection, next step PRP injections whenever she wants to set up, discussed this with her  Neck pain for the past 2 weeks, she did have a fall back in June she mistepped but doesn't recall hitting her neck, she did have a bruise over left eyebrow, she didn't seek care at that time. Denies any radiating pain but feels like a sharp pain and catch on right side of neck No new tingling or numbness of upper ext      Review Of Systems:  GEN- denies fatigue, fever, weight loss,weakness, recent illness HEENT- denies eye drainage, change in vision, nasal discharge, CVS- denies chest pain, palpitations RESP- denies SOB, cough, wheeze ABD- denies N/V, change in stools, abd pain GU- denies dysuria, hematuria, dribbling, incontinence MSK- +joint pain, muscle aches, injury Neuro- denies headache, dizziness, syncope, seizure activity       Objective:    BP 126/70   Pulse 64   Temp 98.8 F (37.1 C) (Temporal)   Resp 14   Ht 4\' 9"  (1.448 m)   Wt 154 lb (69.9 kg)   SpO2 95%   BMI 33.33 kg/m  HEENT- PERRL, EOMI, non injected sclera, pink conjunctiva, MMM, oropharynx clear Neck- Supple, no thyromegaly, fair rom, ttp RIGHT Paraspinals and trapezius region, C spine NT  CVS- RRR, no murmur RESP-CTAB MSK- bilat knees wearing braces, no effusion, fair ROM, +crepitus  NEURO- sensation in tact UE, strength grossly in tact, able to grasp bilat  EXT- chronic ankle edema Pulses- Radial 2+, DP- 1+       Assessment & Plan:      Problem List Items Addressed This Visit      Unprioritized   Bilateral primary osteoarthritis of knee - Primary    F/U ortho, continue ultram/tylenol #3        Other Visit Diagnoses    Neck pain        generalized OA, expect DDD in neck, but with fall will xray C spine, use topical Voltaren, continue other meds    Relevant Orders   DG Cervical Spine Complete      Note: This dictation was prepared with Dragon dictation along with smaller phrase technology. Any transcriptional errors that result from this process are unintentional.

## 2019-11-11 ENCOUNTER — Telehealth: Payer: Self-pay | Admitting: Orthopaedic Surgery

## 2019-11-11 NOTE — Telephone Encounter (Signed)
Spoke with patients daughter and she would like a referral to outpatient PT for her. Thanks.

## 2019-11-11 NOTE — Telephone Encounter (Signed)
Please call daughter Katharine Look about question she has for Dr. [name] phone number is [phone].

## 2019-11-11 NOTE — Telephone Encounter (Signed)
Patient's daughter Katharine Look would like a call from Dr. Erlinda Hong. [phone]

## 2019-11-12 ENCOUNTER — Other Ambulatory Visit: Payer: Self-pay

## 2019-11-12 DIAGNOSIS — M25561 Pain in right knee: Secondary | ICD-10-CM

## 2019-11-12 DIAGNOSIS — G8929 Other chronic pain: Secondary | ICD-10-CM

## 2019-11-12 DIAGNOSIS — M25562 Pain in left knee: Secondary | ICD-10-CM

## 2019-11-12 NOTE — Telephone Encounter (Signed)
Order made

## 2019-11-12 NOTE — Telephone Encounter (Signed)
[name] spoke to patients daughter

## 2019-11-18 ENCOUNTER — Other Ambulatory Visit: Payer: Self-pay | Admitting: *Deleted

## 2019-11-18 MED ORDER — POLYSACCHARIDE IRON COMPLEX 150 MG PO CAPS
150.0000 mg | ORAL_CAPSULE | Freq: Every day | ORAL | 0 refills | Status: DC
Start: 2019-11-18 — End: 2020-09-08

## 2019-11-18 NOTE — Telephone Encounter (Signed)
Received call from patient daughter.   Reports that nephrology will not refill iron until appointment at the end of September.   Requested to have PCP fill iron as patient is currently out of medication.   Ok to refill?

## 2019-11-24 ENCOUNTER — Ambulatory Visit: Payer: Medicare HMO | Admitting: Rehabilitative and Restorative Service Providers"

## 2019-11-24 ENCOUNTER — Encounter: Payer: Self-pay | Admitting: Rehabilitative and Restorative Service Providers"

## 2019-11-24 ENCOUNTER — Other Ambulatory Visit: Payer: Self-pay

## 2019-11-24 DIAGNOSIS — M25661 Stiffness of right knee, not elsewhere classified: Secondary | ICD-10-CM

## 2019-11-24 DIAGNOSIS — R262 Difficulty in walking, not elsewhere classified: Secondary | ICD-10-CM

## 2019-11-24 DIAGNOSIS — M25662 Stiffness of left knee, not elsewhere classified: Secondary | ICD-10-CM

## 2019-11-24 DIAGNOSIS — M25562 Pain in left knee: Secondary | ICD-10-CM

## 2019-11-24 DIAGNOSIS — M25561 Pain in right knee: Secondary | ICD-10-CM

## 2019-11-24 DIAGNOSIS — G8929 Other chronic pain: Secondary | ICD-10-CM | POA: Diagnosis not present

## 2019-11-24 NOTE — Patient Instructions (Signed)
Access Code: [email or web address] URL: [email or web address] Date: 11/24/2019 Prepared by: Vista Mink  Exercises Supine Quadricep Sets - 5 x daily - 7 x weekly - 1 sets - 10 reps - 5 hold Small Range Straight Leg Raise - 1 x daily - 7 x weekly - 6 sets - 5 reps - 3 secondsinutes hold

## 2019-11-24 NOTE — Therapy (Signed)
[facility] [place], [place], [address] Phone: [phone]   Fax:  [phone]  Physical Therapy Evaluation  Patient Details  Name: [name] MRN: [id number] Date of Birth: [birth year] Referring Provider (PT): Leandrew Koyanagi MD   Encounter Date: 11/24/2019   PT End of Session - 11/24/19 1718    Visit Number 1    Number of Visits 16    PT Start Time 1300    PT Stop Time 1027    PT Time Calculation (min) 45 min    Activity Tolerance Patient tolerated treatment well;No increased pain;Patient limited by fatigue    Behavior During Therapy [facility] for tasks assessed/performed           Past Medical History:  Diagnosis Date   Anxiety    Arthritis    CKD (chronic kidney disease)    Dr. Hinda Lenis   Depression    GERD (gastroesophageal reflux disease)    Hyperlipidemia    Hypertension    Osteoporosis    Stroke [facility])    UTI (lower urinary tract infection)     Past Surgical History:  Procedure Laterality Date   CHOLECYSTECTOMY      There were no vitals filed for this visit.    Subjective Assessment - 11/24/19 1715    Subjective [name] is looking to stand and walk better with her severely arthritic knees (L worse than R).    Patient is accompained by: Family member    Limitations Standing;Walking    How long can you stand comfortably? Not long (< 5 minutes)    How long can you walk comfortably? Short distances (5 minutes)    Patient Stated Goals Stand and walk better    Currently in Pain? Yes    Pain Score 4     Pain Location Knee    Pain Orientation Other (Comment)   Bilateral   Pain Descriptors / Indicators Aching;Tightness;Sore    Pain Type Chronic pain    Pain Onset More than a month ago    Pain Frequency Constant    Aggravating Factors  Weight-bearing    Pain Relieving Factors Rest    Effect of Pain on Daily Activities Limits standing and walking endurance              [facility] PT Assessment -  11/24/19 0001      Assessment   Medical Diagnosis Chronic B knee pain    Referring Provider (PT) Georga Kaufmann Ephriam Jenkins MD    Onset Date/Surgical Date --   Chronic     Balance Screen   Has the patient fallen in the past 6 months Yes    How many times? 1    Has the patient had a decrease in activity level because of a fear of falling?  No    Is the patient reluctant to leave their home because of a fear of falling?  No      Cognition   Overall Cognitive Status Within Functional Limits for tasks assessed      ROM / Strength   AROM / PROM / Strength AROM;Strength      AROM   Overall AROM  Deficits    AROM Assessment Site Knee    Right/Left Knee Left;Right    Right Knee Extension -3    Right Knee Flexion 119    Left Knee Extension 0    Left Knee Flexion 109      Strength   Overall Strength Deficits  Strength Assessment Site Knee    Right/Left Knee Left;Right    Right Knee Extension 3+/5    Left Knee Flexion 3/5      Functional Gait  Assessment   Gait assessed  Yes    FGA comment: --   [name] is using a walker for short distances                     Objective measurements completed on examination: See above findings.       [facility] Adult PT Treatment/Exercise - 11/24/19 0001      Exercises   Exercises Knee/Hip      Knee/Hip Exercises: Seated   Long Arc Quad Strengthening;Both;3 sets;5 sets   slow eccentrics seated straight leg raises   Other Seated Knee/Hip Exercises Tailgate knee flexion AROM 3 minutes      Knee/Hip Exercises: Supine   Quad Sets Strengthening;Both;1 set;10 reps   5 seconds                 PT Education - 11/24/19 1717    Education Details Prescribed and reviewed an appropriate HEP    Person(s) Educated Patient;Caregiver(s)    Methods Explanation;Demonstration;Tactile cues;Verbal cues;Handout    Comprehension Verbalized understanding;Tactile cues required;Need further instruction;Returned demonstration;Verbal cues required                 PT Long Term Goals - 11/24/19 1722      PT LONG TERM GOAL #1   Title [name] will be able to stand and walk for at least 2X as long as she is currently (10 minutes from 5 minutes)    Time 8    Period Weeks    Status New    Target Date 01/30/20      PT LONG TERM GOAL #2   Title [name] will report B knee pain consistently < 4/10 on the Numeric Pain Rating Scale.    Baseline Can be 5/10    Time 8    Period Weeks    Status New    Target Date 01/30/20      PT LONG TERM GOAL #3   Title [name] will be independent with her long-term maintenence HEP.    Time 8    Period Weeks    Status New    Target Date 01/30/20                  Plan - 11/24/19 1718    Clinical Impression Statement Kalysta has severe B knee OA (L worse than R).  Her L knee is very valgus.  She uses a walker to ambulate and needs assistance with stairs.  She may be a TKA candidate but would like to go the conservative route to see how she does with strength work and possibly injections before TKA.    Personal Factors and Comorbidities Age;Fitness    Examination-Activity Limitations Dressing;Bed Mobility;Bend;Lift;Squat;Locomotion Level;Stairs;Carry;Stand    Examination-Participation Restrictions Cleaning;Community Activity;Driving;Meal Prep;Laundry    Stability/Clinical Decision Making Stable/Uncomplicated    Clinical Decision Making Low    Rehab Potential Fair    PT Frequency 2x / week    PT Duration 8 weeks    PT Treatment/Interventions ADLs/Self Care Home Management;Cryotherapy;Therapeutic activities;Stair training;Gait training;Therapeutic exercise;Balance training;Neuromuscular re-education;Patient/family education;Manual techniques    PT Next Visit Plan Progress strengthening while preserving joint function    PT Home Exercise Plan Access Code: [email or web address]    Consulted and Agree with Plan of Care Patient;Family member/caregiver    Family Member Consulted Son  in law           Patient will benefit  from skilled therapeutic intervention in order to improve the following deficits and impairments:  Abnormal gait, Decreased activity tolerance, Decreased endurance, Decreased range of motion, Decreased strength, Difficulty walking, Increased edema, Pain  Visit Diagnosis: Chronic pain of left knee  Difficulty walking  Chronic pain of right knee  Stiffness of left knee, not elsewhere classified  Stiffness of right knee, not elsewhere classified     Problem List Patient Active Problem List   Diagnosis Date Noted   GERD (gastroesophageal reflux disease) 08/12/2019   Bilateral primary osteoarthritis of knee 11/09/2016   History of stroke 06/02/2016   Osteoarthritis 03/25/2014   Anxiety and depression 02/20/2012   Chronic headache 02/20/2012   Peripheral edema 02/19/2012   Hyperparathyroidism ([place]) 10/23/2008   ANEMIA 10/23/2008   Essential hypertension 10/23/2008   GALLSTONE PANCREATITIS 10/23/2008   Chronic kidney disease 10/23/2008   DEGENERATIVE JOINT DISEASE 10/23/2008   PROTEINURIA 10/23/2008    Farley Ly PT, MPT 11/24/2019, 5:28 PM  [facility] [address] [place], [place], [address] Phone: [phone]   Fax:  [phone]  Name: LASHEIKA ORTLOFF MRN: [id number] Date of Birth: 1930-05-09

## 2019-11-27 ENCOUNTER — Encounter: Payer: Self-pay | Admitting: Physical Therapy

## 2019-11-27 ENCOUNTER — Other Ambulatory Visit: Payer: Self-pay

## 2019-11-27 ENCOUNTER — Ambulatory Visit: Payer: Medicare HMO | Admitting: Physical Therapy

## 2019-11-27 DIAGNOSIS — M25561 Pain in right knee: Secondary | ICD-10-CM

## 2019-11-27 DIAGNOSIS — G8929 Other chronic pain: Secondary | ICD-10-CM | POA: Diagnosis not present

## 2019-11-27 DIAGNOSIS — M25662 Stiffness of left knee, not elsewhere classified: Secondary | ICD-10-CM

## 2019-11-27 DIAGNOSIS — M25661 Stiffness of right knee, not elsewhere classified: Secondary | ICD-10-CM

## 2019-11-27 DIAGNOSIS — R262 Difficulty in walking, not elsewhere classified: Secondary | ICD-10-CM

## 2019-11-27 DIAGNOSIS — M25562 Pain in left knee: Secondary | ICD-10-CM

## 2019-11-27 NOTE — Therapy (Signed)
[place], [place], [address] Phone: [phone]   Fax:  [phone]  Physical Therapy Treatment  Patient Details  Name: [name] MRN: [id number] Date of Birth: June 30, 1930 Referring Provider (PT): Leandrew Koyanagi MD   Encounter Date: 11/27/2019   PT End of Session - 11/27/19 1234    Visit Number 2    Number of Visits 16    PT Start Time 1140    PT Stop Time 4174    PT Time Calculation (min) 44 min    Activity Tolerance Patient tolerated treatment well;No increased pain;Patient limited by fatigue    Behavior During Therapy [facility] for tasks assessed/performed           Past Medical History:  Diagnosis Date   Anxiety    Arthritis    CKD (chronic kidney disease)    Dr. Hinda Lenis   Depression    GERD (gastroesophageal reflux disease)    Hyperlipidemia    Hypertension    Osteoporosis    Stroke [facility])    UTI (lower urinary tract infection)     Past Surgical History:  Procedure Laterality Date   CHOLECYSTECTOMY      There were no vitals filed for this visit.   Subjective Assessment - 11/27/19 1143    Subjective doing well; feels Rt knee is a little stiffer.  doing exercises twice a day.    Patient is accompained by: Family member    Limitations Standing;Walking    How long can you stand comfortably? Not long (< 5 minutes)    How long can you walk comfortably? Short distances (5 minutes)    Patient Stated Goals Stand and walk better    Currently in Pain? Yes    Pain Score 0-No pain                             [facility] Adult PT Treatment/Exercise - 11/27/19 1144      Knee/Hip Exercises: Aerobic   Nustep L5 x 6 min (UE/LE)      Knee/Hip Exercises: Seated   Long Arc Quad Both;3 sets;10 reps    Other Seated Knee/Hip Exercises Tailgate knee flexion AROM 3 minutes    Other Seated Knee/Hip Exercises seated SLR 3x5 reps; 3 sec hold      Knee/Hip Exercises: Supine   Short Arc Quad  Sets Both;20 reps      Knee/Hip Exercises: Sidelying   Hip ABduction Both;20 reps                  PT Education - 11/27/19 1218    Education Details added to HEP    Person(s) Educated Patient    Methods Explanation;Demonstration;Handout    Comprehension Verbalized understanding;Returned demonstration;Need further instruction               PT Long Term Goals - 11/27/19 1235      PT LONG TERM GOAL #1   Title [name] will be able to stand and walk for at least 2X as long as she is currently (10 minutes from 5 minutes)    Time 8    Period Weeks    Status On-going    Target Date 01/30/20      PT LONG TERM GOAL #2   Title [name] will report B knee pain consistently < 4/10 on the Numeric Pain Rating Scale.    Baseline Can be 5/10    Time 8  Period Weeks    Status On-going    Target Date 01/30/20      PT LONG TERM GOAL #3   Title [name] will be independent with her long-term maintenence HEP.    Time 8    Period Weeks    Status On-going    Target Date 01/30/20                 Plan - 11/27/19 1235    Clinical Impression Statement Pt needed min cues for current HEP, and added additional exercises today.  Overall tolerated session well today.  No goals met at this time as only 2nd visit.    Personal Factors and Comorbidities Age;Fitness    Examination-Activity Limitations Dressing;Bed Mobility;Bend;Lift;Squat;Locomotion Level;Stairs;Carry;Stand    Examination-Participation Restrictions Cleaning;Community Activity;Driving;Meal Prep;Laundry    Stability/Clinical Decision Making Stable/Uncomplicated    Rehab Potential Fair    PT Frequency 2x / week    PT Duration 8 weeks    PT Treatment/Interventions ADLs/Self Care Home Management;Cryotherapy;Therapeutic activities;Stair training;Gait training;Therapeutic exercise;Balance training;Neuromuscular re-education;Patient/family education;Manual techniques    PT Next Visit Plan continue with strengthening, standing  tolerance as able    PT Home Exercise Plan Access Code: [email or web address]    Consulted and Agree with Plan of Care Patient;Family member/caregiver    Family Member Consulted Son in law           Patient will benefit from skilled therapeutic intervention in order to improve the following deficits and impairments:  Abnormal gait, Decreased activity tolerance, Decreased endurance, Decreased range of motion, Decreased strength, Difficulty walking, Increased edema, Pain  Visit Diagnosis: Difficulty walking  Chronic pain of left knee  Chronic pain of right knee  Stiffness of left knee, not elsewhere classified  Stiffness of right knee, not elsewhere classified     Problem List Patient Active Problem List   Diagnosis Date Noted   GERD (gastroesophageal reflux disease) 08/12/2019   Bilateral primary osteoarthritis of knee 11/09/2016   History of stroke 06/02/2016   Osteoarthritis 03/25/2014   Anxiety and depression 02/20/2012   Chronic headache 02/20/2012   Peripheral edema 02/19/2012   Hyperparathyroidism ([place]) 10/23/2008   ANEMIA 10/23/2008   Essential hypertension 10/23/2008   GALLSTONE PANCREATITIS 10/23/2008   Chronic kidney disease 10/23/2008   DEGENERATIVE JOINT DISEASE 10/23/2008   PROTEINURIA 10/23/2008      Laureen Abrahams, PT, DPT 11/27/19 12:39 PM    [facility] [address] [place], [place], [address] Phone: [phone]   Fax:  [phone]  Name: [name] MRN: [id number] Date of Birth: September 26, 1930

## 2019-11-27 NOTE — Patient Instructions (Signed)
Access Code: [email or web address] URL: [email or web address] Date: 11/27/2019 Prepared by: Faustino Congress  Exercises Small Range Straight Leg Raise - 1 x daily - 7 x weekly - 6 sets - 5 reps - 3 sec hold Supine Short Arc Quad - 1 x daily - 7 x weekly - 1 sets - 20 reps - 5 sec hold Seated Long Arc Quad - 2 x daily - 7 x weekly - 3 sets - 10 reps - 3 sec hold Sidelying Hip Abduction - 2 x daily - 7 x weekly - 1 sets - 10 reps

## 2019-12-04 ENCOUNTER — Ambulatory Visit: Payer: Medicare HMO | Admitting: Physical Therapy

## 2019-12-04 ENCOUNTER — Encounter: Payer: Self-pay | Admitting: Physical Therapy

## 2019-12-04 ENCOUNTER — Other Ambulatory Visit: Payer: Self-pay

## 2019-12-04 DIAGNOSIS — M25562 Pain in left knee: Secondary | ICD-10-CM

## 2019-12-04 DIAGNOSIS — M25662 Stiffness of left knee, not elsewhere classified: Secondary | ICD-10-CM | POA: Diagnosis not present

## 2019-12-04 DIAGNOSIS — R262 Difficulty in walking, not elsewhere classified: Secondary | ICD-10-CM | POA: Diagnosis not present

## 2019-12-04 DIAGNOSIS — G8929 Other chronic pain: Secondary | ICD-10-CM

## 2019-12-04 DIAGNOSIS — M25561 Pain in right knee: Secondary | ICD-10-CM

## 2019-12-04 DIAGNOSIS — M25661 Stiffness of right knee, not elsewhere classified: Secondary | ICD-10-CM | POA: Diagnosis not present

## 2019-12-04 NOTE — Therapy (Signed)
[facility] [place], [place], [address] Phone: [phone]   Fax:  [phone]  Physical Therapy Treatment  Patient Details  Name: [name] MRN: [id number] Date of Birth: Aug 14, 1930 Referring Provider (PT): Leandrew Koyanagi MD   Encounter Date: 12/04/2019   PT End of Session - 12/04/19 1459    Visit Number 3    Number of Visits 16    PT Start Time 3299    PT Stop Time 2426    PT Time Calculation (min) 44 min    Activity Tolerance Patient tolerated treatment well;No increased pain;Patient limited by fatigue    Behavior During Therapy [facility] for tasks assessed/performed           Past Medical History:  Diagnosis Date  . Anxiety   . Arthritis   . CKD (chronic kidney disease)    Dr. Hinda Lenis  . Depression   . GERD (gastroesophageal reflux disease)   . Hyperlipidemia   . Hypertension   . Osteoporosis   . Stroke ([place])   . UTI (lower urinary tract infection)     Past Surgical History:  Procedure Laterality Date  . CHOLECYSTECTOMY      There were no vitals filed for this visit.   Subjective Assessment - 12/04/19 1413    Subjective per son in law having some severe pain today    Patient is accompained by: Family member    Limitations Standing;Walking    How long can you stand comfortably? Not long (< 5 minutes)    How long can you walk comfortably? Short distances (5 minutes)    Patient Stated Goals Stand and walk better    Currently in Pain? Yes    Pain Score 6     Pain Location Knee    Pain Orientation Right;Left    Pain Descriptors / Indicators Aching;Tightness;Sore    Pain Type Chronic pain    Pain Onset More than a month ago    Pain Frequency Constant    Aggravating Factors  weight-bearing    Pain Relieving Factors rest                             [facility] Adult PT Treatment/Exercise - 12/04/19 1419      Knee/Hip Exercises: Aerobic   Nustep L6 x 10 min      Knee/Hip Exercises: Seated     Long Arc Quad Both;20 reps      Knee/Hip Exercises: Supine   Quad Sets Both;20 reps    Short Arc Quad Sets Both;20 reps    Bridges 20 reps    Bridges Limitations 3 sec    Other Supine Knee/Hip Exercises supine clam L3 band x 20 reps      Modalities   Modalities Moist Heat      Moist Heat Therapy   Number Minutes Moist Heat 15 Minutes    Moist Heat Location Knee   bil -with supine exercises                      PT Long Term Goals - 11/27/19 1235      PT LONG TERM GOAL #1   Title [name] will be able to stand and walk for at least 2X as long as she is currently (10 minutes from 5 minutes)    Time 8    Period Weeks    Status On-going    Target Date 01/30/20  PT LONG TERM GOAL #2   Title [name] will report B knee pain consistently < 4/10 on the Numeric Pain Rating Scale.    Baseline Can be 5/10    Time 8    Period Weeks    Status On-going    Target Date 01/30/20      PT LONG TERM GOAL #3   Title [name] will be independent with her long-term maintenence HEP.    Time 8    Period Weeks    Status On-going    Target Date 01/30/20                 Plan - 12/04/19 1459    Clinical Impression Statement Pt reported increased knee pain today, so added heat with supine exercises for 15 min with improvement in pain.  Recommended rollator RW for days when she is fatigued or pain is elevated to help with mobility and decreasing fall risk.  Will continue to benefit from PT to maximize function.    Personal Factors and Comorbidities Age;Fitness    Examination-Activity Limitations Dressing;Bed Mobility;Bend;Lift;Squat;Locomotion Level;Stairs;Carry;Stand    Examination-Participation Restrictions Cleaning;Community Activity;Driving;Meal Prep;Laundry    Stability/Clinical Decision Making Stable/Uncomplicated    Rehab Potential Fair    PT Frequency 2x / week    PT Duration 8 weeks    PT Treatment/Interventions ADLs/Self Care Home Management;Cryotherapy;Therapeutic  activities;Stair training;Gait training;Therapeutic exercise;Balance training;Neuromuscular re-education;Patient/family education;Manual techniques    PT Next Visit Plan continue with strengthening, standing tolerance as able    PT Home Exercise Plan Access Code: [email or web address]    Consulted and Agree with Plan of Care Patient;Family member/caregiver    Family Member Consulted Son in law           Patient will benefit from skilled therapeutic intervention in order to improve the following deficits and impairments:  Abnormal gait, Decreased activity tolerance, Decreased endurance, Decreased range of motion, Decreased strength, Difficulty walking, Increased edema, Pain  Visit Diagnosis: Difficulty walking  Chronic pain of left knee  Chronic pain of right knee  Stiffness of left knee, not elsewhere classified  Stiffness of right knee, not elsewhere classified     Problem List Patient Active Problem List   Diagnosis Date Noted  . GERD (gastroesophageal reflux disease) 08/12/2019  . Bilateral primary osteoarthritis of knee 11/09/2016  . History of stroke 06/02/2016  . Osteoarthritis 03/25/2014  . Anxiety and depression 02/20/2012  . Chronic headache 02/20/2012  . Peripheral edema 02/19/2012  . Hyperparathyroidism ([place]) 10/23/2008  . ANEMIA 10/23/2008  . Essential hypertension 10/23/2008  . GALLSTONE PANCREATITIS 10/23/2008  . Chronic kidney disease 10/23/2008  . DEGENERATIVE JOINT DISEASE 10/23/2008  . PROTEINURIA 10/23/2008     Laureen Abrahams, PT, DPT 12/04/19 3:01 PM    [facility] [address] [place], [place], [address] Phone: [phone]   Fax:  [phone]  Name: [name] MRN: [id number] Date of Birth: [birth year]

## 2019-12-05 DIAGNOSIS — R809 Proteinuria, unspecified: Secondary | ICD-10-CM | POA: Diagnosis not present

## 2019-12-05 DIAGNOSIS — N1831 Chronic kidney disease, stage 3a: Secondary | ICD-10-CM | POA: Diagnosis not present

## 2019-12-05 DIAGNOSIS — Z79899 Other long term (current) drug therapy: Secondary | ICD-10-CM | POA: Diagnosis not present

## 2019-12-05 DIAGNOSIS — E559 Vitamin D deficiency, unspecified: Secondary | ICD-10-CM | POA: Diagnosis not present

## 2019-12-05 DIAGNOSIS — D631 Anemia in chronic kidney disease: Secondary | ICD-10-CM | POA: Diagnosis not present

## 2019-12-09 ENCOUNTER — Other Ambulatory Visit: Payer: Self-pay

## 2019-12-09 ENCOUNTER — Encounter: Payer: Self-pay | Admitting: Physical Therapy

## 2019-12-09 ENCOUNTER — Ambulatory Visit: Payer: Medicare HMO | Admitting: Physical Therapy

## 2019-12-09 ENCOUNTER — Encounter: Payer: Medicare HMO | Admitting: Physical Therapy

## 2019-12-09 DIAGNOSIS — R262 Difficulty in walking, not elsewhere classified: Secondary | ICD-10-CM

## 2019-12-09 DIAGNOSIS — G8929 Other chronic pain: Secondary | ICD-10-CM | POA: Diagnosis not present

## 2019-12-09 DIAGNOSIS — M25562 Pain in left knee: Secondary | ICD-10-CM | POA: Diagnosis not present

## 2019-12-09 DIAGNOSIS — M25662 Stiffness of left knee, not elsewhere classified: Secondary | ICD-10-CM | POA: Diagnosis not present

## 2019-12-09 DIAGNOSIS — M25561 Pain in right knee: Secondary | ICD-10-CM

## 2019-12-09 DIAGNOSIS — M25661 Stiffness of right knee, not elsewhere classified: Secondary | ICD-10-CM | POA: Diagnosis not present

## 2019-12-09 NOTE — Therapy (Signed)
[place], [place], [address] Phone: [phone]   Fax:  [phone]  Physical Therapy Treatment  Patient Details  Name: [name] MRN: [id number] Date of Birth: Sep 15, 1930 Referring Provider (PT): Leandrew Koyanagi MD   Encounter Date: 12/09/2019   PT End of Session - 12/09/19 1344    Visit Number 4    Number of Visits 16    PT Start Time 8338    PT Stop Time 1340    PT Time Calculation (min) 41 min    Activity Tolerance Patient tolerated treatment well;No increased pain;Patient limited by fatigue    Behavior During Therapy [facility] for tasks assessed/performed           Past Medical History:  Diagnosis Date  . Anxiety   . Arthritis   . CKD (chronic kidney disease)    Dr. Hinda Lenis  . Depression   . GERD (gastroesophageal reflux disease)   . Hyperlipidemia   . Hypertension   . Osteoporosis   . Stroke ([place])   . UTI (lower urinary tract infection)     Past Surgical History:  Procedure Laterality Date  . CHOLECYSTECTOMY      There were no vitals filed for this visit.   Subjective Assessment - 12/09/19 1302    Subjective knees are "sore" today    Patient is accompained by: Family member    Limitations Standing;Walking    How long can you stand comfortably? Not long (< 5 minutes)    How long can you walk comfortably? Short distances (5 minutes)    Patient Stated Goals Stand and walk better    Currently in Pain? Yes    Pain Score 9     Pain Location Knee    Pain Orientation Right;Left   Rt > Lt   Pain Descriptors / Indicators Aching;Tightness;Sore    Pain Type Chronic pain    Pain Onset More than a month ago    Pain Frequency Constant    Aggravating Factors  walking, weightbearing    Pain Relieving Factors sitting, resting                             [facility] Adult PT Treatment/Exercise - 12/09/19 1307      Knee/Hip Exercises: Aerobic   Nustep L6 x 10 min      Knee/Hip Exercises:  Seated   Long Arc Quad Both;20 reps    Long Arc Quad Weight 3 lbs.    Ball Squeeze 20 x 5 sec    Clamshell with TheraBand Red   x 20 reps   Marching Both;20 reps;Weights    Marching Weights 3 lbs.    Hamstring Curl Both;20 reps    Hamstring Limitations L2 band                  PT Education - 12/09/19 1343    Education Details benefits of aquatic therapy and possible bracing for pain management    Person(s) Educated Patient    Methods Explanation;Demonstration;Handout    Comprehension Verbalized understanding;Returned demonstration;Need further instruction               PT Long Term Goals - 11/27/19 1235      PT LONG TERM GOAL #1   Title [name] will be able to stand and walk for at least 2X as long as she is currently (10 minutes from 5 minutes)    Time 8  Period Weeks    Status On-going    Target Date 01/30/20      PT LONG TERM GOAL #2   Title [name] will report B knee pain consistently < 4/10 on the Numeric Pain Rating Scale.    Baseline Can be 5/10    Time 8    Period Weeks    Status On-going    Target Date 01/30/20      PT LONG TERM GOAL #3   Title [name] will be independent with her long-term maintenence HEP.    Time 8    Period Weeks    Status On-going    Target Date 01/30/20                 Plan - 12/09/19 1344    Clinical Impression Statement Pt continues to have elevated pain affecting ability to progress to standing exercises.  Discussed alternative options for management of symptoms including bracing and aquatic therapy.  Will continue to benefit from PT to maximize function.    Personal Factors and Comorbidities Age;Fitness    Examination-Activity Limitations Dressing;Bed Mobility;Bend;Lift;Squat;Locomotion Level;Stairs;Carry;Stand    Examination-Participation Restrictions Cleaning;Community Activity;Driving;Meal Prep;Laundry    Stability/Clinical Decision Making Stable/Uncomplicated    Rehab Potential Fair    PT Frequency 2x / week      PT Duration 8 weeks    PT Treatment/Interventions ADLs/Self Care Home Management;Cryotherapy;Therapeutic activities;Stair training;Gait training;Therapeutic exercise;Balance training;Neuromuscular re-education;Patient/family education;Manual techniques    PT Next Visit Plan continue with strengthening, standing tolerance as able    PT Home Exercise Plan Access Code: [email or web address]    Consulted and Agree with Plan of Care Patient;Family member/caregiver    Family Member Consulted Son in law           Patient will benefit from skilled therapeutic intervention in order to improve the following deficits and impairments:  Abnormal gait, Decreased activity tolerance, Decreased endurance, Decreased range of motion, Decreased strength, Difficulty walking, Increased edema, Pain  Visit Diagnosis: Difficulty walking  Chronic pain of left knee  Chronic pain of right knee  Stiffness of left knee, not elsewhere classified  Stiffness of right knee, not elsewhere classified     Problem List Patient Active Problem List   Diagnosis Date Noted  . GERD (gastroesophageal reflux disease) 08/12/2019  . Bilateral primary osteoarthritis of knee 11/09/2016  . History of stroke 06/02/2016  . Osteoarthritis 03/25/2014  . Anxiety and depression 02/20/2012  . Chronic headache 02/20/2012  . Peripheral edema 02/19/2012  . Hyperparathyroidism ([place]) 10/23/2008  . ANEMIA 10/23/2008  . Essential hypertension 10/23/2008  . GALLSTONE PANCREATITIS 10/23/2008  . Chronic kidney disease 10/23/2008  . DEGENERATIVE JOINT DISEASE 10/23/2008  . PROTEINURIA 10/23/2008      Laureen Abrahams, PT, DPT 12/09/19 1:47 PM     [facility] [address] [place], [place], [address] Phone: [phone]   Fax:  [phone]  Name: [name] MRN: [id number] Date of Birth: Nov 10, 1930

## 2019-12-11 ENCOUNTER — Other Ambulatory Visit: Payer: Self-pay

## 2019-12-11 ENCOUNTER — Ambulatory Visit: Payer: Medicare HMO | Admitting: Physical Therapy

## 2019-12-11 ENCOUNTER — Encounter: Payer: Self-pay | Admitting: Physical Therapy

## 2019-12-11 VITALS — BP 140/75 | HR 74

## 2019-12-11 DIAGNOSIS — M25561 Pain in right knee: Secondary | ICD-10-CM | POA: Diagnosis not present

## 2019-12-11 DIAGNOSIS — R262 Difficulty in walking, not elsewhere classified: Secondary | ICD-10-CM

## 2019-12-11 DIAGNOSIS — M25562 Pain in left knee: Secondary | ICD-10-CM | POA: Diagnosis not present

## 2019-12-11 DIAGNOSIS — E211 Secondary hyperparathyroidism, not elsewhere classified: Secondary | ICD-10-CM | POA: Diagnosis not present

## 2019-12-11 DIAGNOSIS — M25662 Stiffness of left knee, not elsewhere classified: Secondary | ICD-10-CM | POA: Diagnosis not present

## 2019-12-11 DIAGNOSIS — E559 Vitamin D deficiency, unspecified: Secondary | ICD-10-CM | POA: Diagnosis not present

## 2019-12-11 DIAGNOSIS — G8929 Other chronic pain: Secondary | ICD-10-CM

## 2019-12-11 DIAGNOSIS — N184 Chronic kidney disease, stage 4 (severe): Secondary | ICD-10-CM | POA: Diagnosis not present

## 2019-12-11 DIAGNOSIS — M25661 Stiffness of right knee, not elsewhere classified: Secondary | ICD-10-CM

## 2019-12-11 DIAGNOSIS — R809 Proteinuria, unspecified: Secondary | ICD-10-CM | POA: Diagnosis not present

## 2019-12-11 DIAGNOSIS — R600 Localized edema: Secondary | ICD-10-CM | POA: Diagnosis not present

## 2019-12-11 NOTE — Therapy (Signed)
[place], [place], [address] Phone: [phone]   Fax:  [phone]  Physical Therapy Treatment  Patient Details  Name: [name] MRN: [id number] Date of Birth: October 04, 1930 Referring Provider (PT): Leandrew Koyanagi MD   Encounter Date: 12/11/2019   PT End of Session - 12/11/19 1508    Visit Number 5    Number of Visits 16    PT Start Time 6237    PT Stop Time 1507    PT Time Calculation (min) 39 min    Activity Tolerance Patient tolerated treatment well;No increased pain;Patient limited by fatigue    Behavior During Therapy [facility] for tasks assessed/performed           Past Medical History:  Diagnosis Date  . Anxiety   . Arthritis   . CKD (chronic kidney disease)    Dr. Hinda Lenis  . Depression   . GERD (gastroesophageal reflux disease)   . Hyperlipidemia   . Hypertension   . Osteoporosis   . Stroke ([place])   . UTI (lower urinary tract infection)     Past Surgical History:  Procedure Laterality Date  . CHOLECYSTECTOMY      Vitals:   12/11/19 1430  BP: 140/75  Pulse: 74  SpO2: 97%     Subjective Assessment - 12/11/19 1430    Subjective knees feel a little better today - still has episodes of increased pain    Patient is accompained by: Family member    Limitations Standing;Walking    How long can you stand comfortably? Not long (< 5 minutes)    How long can you walk comfortably? Short distances (5 minutes)    Patient Stated Goals Stand and walk better    Currently in Pain? Yes    Pain Score 5     Pain Location Knee    Pain Orientation Right;Left    Pain Descriptors / Indicators Aching;Tightness;Sore    Pain Type Chronic pain    Pain Onset More than a month ago    Pain Frequency Constant    Aggravating Factors  walking, weightbearing    Pain Relieving Factors sitting, resting                             [facility] Adult PT Treatment/Exercise - 12/11/19 1431      Knee/Hip  Exercises: Aerobic   Nustep L6 x 10 min      Knee/Hip Exercises: Machines for Strengthening   Cybex Knee Extension 2x10; 5# - assist for full range      Knee/Hip Exercises: Standing   Heel Raises Both;20 reps    Knee Flexion Both;10 reps    Hip Abduction Both;10 reps    Abduction Limitations lateral taps    Hip Extension Both;10 reps   with toe touch                      PT Long Term Goals - 11/27/19 1235      PT LONG TERM GOAL #1   Title Eiley will be able to stand and walk for at least 2X as long as she is currently (10 minutes from 5 minutes)    Time 8    Period Weeks    Status On-going    Target Date 01/30/20      PT LONG TERM GOAL #2   Title Kensly will report B knee pain consistently < 4/10 on the Numeric  Pain Rating Scale.    Baseline Can be 5/10    Time 8    Period Weeks    Status On-going    Target Date 01/30/20      PT LONG TERM GOAL #3   Title Vinetta will be independent with her long-term maintenence HEP.    Time 8    Period Weeks    Status On-going    Target Date 01/30/20                 Plan - 12/11/19 1508    Clinical Impression Statement Mild increase in SOB today and reported elevated BP at MD today.  Vitals stable in clinic today with increased rest breaks noted today.  Tolerated standing exercises well today.  Will continue to benefit from PT to maximize function.    Personal Factors and Comorbidities Age;Fitness    Examination-Activity Limitations Dressing;Bed Mobility;Bend;Lift;Squat;Locomotion Level;Stairs;Carry;Stand    Examination-Participation Restrictions Cleaning;Community Activity;Driving;Meal Prep;Laundry    Stability/Clinical Decision Making Stable/Uncomplicated    Rehab Potential Fair    PT Frequency 2x / week    PT Duration 8 weeks    PT Treatment/Interventions ADLs/Self Care Home Management;Cryotherapy;Therapeutic activities;Stair training;Gait training;Therapeutic exercise;Balance training;Neuromuscular  re-education;Patient/family education;Manual techniques    PT Next Visit Plan continue with strengthening, standing tolerance as able    PT Home Exercise Plan Access Code: [email or web address]    Consulted and Agree with Plan of Care Patient;Family member/caregiver    Family Member Consulted Son in law           Patient will benefit from skilled therapeutic intervention in order to improve the following deficits and impairments:  Abnormal gait, Decreased activity tolerance, Decreased endurance, Decreased range of motion, Decreased strength, Difficulty walking, Increased edema, Pain  Visit Diagnosis: Difficulty walking  Chronic pain of left knee  Chronic pain of right knee  Stiffness of left knee, not elsewhere classified  Stiffness of right knee, not elsewhere classified     Problem List Patient Active Problem List   Diagnosis Date Noted  . GERD (gastroesophageal reflux disease) 08/12/2019  . Bilateral primary osteoarthritis of knee 11/09/2016  . History of stroke 06/02/2016  . Osteoarthritis 03/25/2014  . Anxiety and depression 02/20/2012  . Chronic headache 02/20/2012  . Peripheral edema 02/19/2012  . Hyperparathyroidism ([place]) 10/23/2008  . ANEMIA 10/23/2008  . Essential hypertension 10/23/2008  . GALLSTONE PANCREATITIS 10/23/2008  . Chronic kidney disease 10/23/2008  . DEGENERATIVE JOINT DISEASE 10/23/2008  . PROTEINURIA 10/23/2008        Laureen Abrahams, PT, DPT 12/11/19 3:11 PM    [facility] [address] [place], [place], [address] Phone: [phone]   Fax:  [phone]  Name: CASMIRA CRAMER MRN: [id number] Date of Birth: Dec 31, 1930

## 2019-12-15 ENCOUNTER — Ambulatory Visit: Payer: Medicare HMO | Admitting: Physical Therapy

## 2019-12-15 ENCOUNTER — Encounter: Payer: Self-pay | Admitting: Physical Therapy

## 2019-12-15 ENCOUNTER — Other Ambulatory Visit: Payer: Self-pay

## 2019-12-15 VITALS — BP 147/75

## 2019-12-15 DIAGNOSIS — M25661 Stiffness of right knee, not elsewhere classified: Secondary | ICD-10-CM | POA: Diagnosis not present

## 2019-12-15 DIAGNOSIS — M25662 Stiffness of left knee, not elsewhere classified: Secondary | ICD-10-CM

## 2019-12-15 DIAGNOSIS — M25561 Pain in right knee: Secondary | ICD-10-CM

## 2019-12-15 DIAGNOSIS — G8929 Other chronic pain: Secondary | ICD-10-CM

## 2019-12-15 DIAGNOSIS — R262 Difficulty in walking, not elsewhere classified: Secondary | ICD-10-CM

## 2019-12-15 DIAGNOSIS — M25562 Pain in left knee: Secondary | ICD-10-CM | POA: Diagnosis not present

## 2019-12-15 NOTE — Patient Instructions (Signed)
Access Code: [email or web address] URL: [email or web address] Date: 12/15/2019 Prepared by: Faustino Congress  Exercises Standing Hip Flexion Extension at [facility] - 1 x daily - 1-2 x weekly - 3 sets - 10 reps Squat - 1 x daily - 1-2 x weekly - 3 sets - 10 reps Side Stepping - 1 x daily - 1-2 x weekly - 3 sets - 10 reps Forward March - 1 x daily - 1-2 x weekly - 3 sets - 10 reps Alternating Forward Lunge - 1 x daily - 1-2 x weekly - 3 sets - 10 reps Jumping Jacks - 1 x daily - 1-2 x weekly - 3 sets - 10 reps Backward March - 1 x daily - 1-2 x weekly - 3 sets - 10 reps

## 2019-12-15 NOTE — Therapy (Signed)
[place], [place], [address] Phone: [phone]   Fax:  [phone]  Physical Therapy Treatment  Patient Details  Name: [name] MRN: [id number] Date of Birth: 09-10-1930 Referring Provider (PT): Leandrew Koyanagi MD   Encounter Date: 12/15/2019   PT End of Session - 12/15/19 1421    Visit Number 6    Number of Visits 16    PT Start Time 1340    PT Stop Time 7628    PT Time Calculation (min) 41 min    Activity Tolerance Patient tolerated treatment well;No increased pain;Patient limited by fatigue    Behavior During Therapy [facility] for tasks assessed/performed           Past Medical History:  Diagnosis Date  . Anxiety   . Arthritis   . CKD (chronic kidney disease)    Dr. Hinda Lenis  . Depression   . GERD (gastroesophageal reflux disease)   . Hyperlipidemia   . Hypertension   . Osteoporosis   . Stroke ([place])   . UTI (lower urinary tract infection)     Past Surgical History:  Procedure Laterality Date  . CHOLECYSTECTOMY      Vitals:   12/15/19 1354  BP: (!) 147/75     Subjective Assessment - 12/15/19 1342    Subjective feels knees are a little better, still having pain in the mornings    Patient is accompained by: Family member    Limitations Standing;Walking    How long can you stand comfortably? Not long (< 5 minutes)    How long can you walk comfortably? Short distances (5 minutes)    Patient Stated Goals Stand and walk better    Currently in Pain? Yes    Pain Score 6     Pain Location Knee    Pain Orientation Right;Left    Pain Descriptors / Indicators Aching;Tightness;Sore    Pain Type Chronic pain    Pain Onset More than a month ago    Pain Frequency Constant    Aggravating Factors  walking, weightbearing    Pain Relieving Factors sitting, resting                             [facility] Adult PT Treatment/Exercise - 12/15/19 1343      Knee/Hip Exercises: Stretches    Passive Hamstring Stretch Right;Left;3 reps;30 seconds      Knee/Hip Exercises: Aerobic   Nustep L6 x 10 min      Knee/Hip Exercises: Machines for Strengthening   Cybex Knee Extension x10; 5# - assist for full range    Cybex Knee Flexion 5# x 10      Knee/Hip Exercises: Seated   Clamshell with TheraBand Red   x 20 reps   Other Seated Knee/Hip Exercises seated SLR 2x10 reps; 3 sec hold, 3#                       PT Long Term Goals - 11/27/19 1235      PT LONG TERM GOAL #1   Title [name] will be able to stand and walk for at least 2X as long as she is currently (10 minutes from 5 minutes)    Time 8    Period Weeks    Status On-going    Target Date 01/30/20      PT LONG TERM GOAL #2   Title [name] will report B knee pain  consistently < 4/10 on the Numeric Pain Rating Scale.    Baseline Can be 5/10    Time 8    Period Weeks    Status On-going    Target Date 01/30/20      PT LONG TERM GOAL #3   Title [name] will be independent with her long-term maintenence HEP.    Time 8    Period Weeks    Status On-going    Target Date 01/30/20                 Plan - 12/15/19 1421    Clinical Impression Statement Pt tolerated session well today reporting decreased pain following knee extension/flexion machines.  Provided aquatic exercises if pt and family wish to pursue.  Will continue to benefit from PT to maximize function.    Personal Factors and Comorbidities Age;Fitness    Examination-Activity Limitations Dressing;Bed Mobility;Bend;Lift;Squat;Locomotion Level;Stairs;Carry;Stand    Examination-Participation Restrictions Cleaning;Community Activity;Driving;Meal Prep;Laundry    Stability/Clinical Decision Making Stable/Uncomplicated    Rehab Potential Fair    PT Frequency 2x / week    PT Duration 8 weeks    PT Treatment/Interventions ADLs/Self Care Home Management;Cryotherapy;Therapeutic activities;Stair training;Gait training;Therapeutic exercise;Balance  training;Neuromuscular re-education;Patient/family education;Manual techniques    PT Next Visit Plan continue with strengthening, standing tolerance as able    PT Home Exercise Plan Access Code: [email or web address]    Consulted and Agree with Plan of Care Patient;Family member/caregiver    Family Member Consulted Son in law           Patient will benefit from skilled therapeutic intervention in order to improve the following deficits and impairments:  Abnormal gait, Decreased activity tolerance, Decreased endurance, Decreased range of motion, Decreased strength, Difficulty walking, Increased edema, Pain  Visit Diagnosis: Difficulty walking  Chronic pain of left knee  Chronic pain of right knee  Stiffness of left knee, not elsewhere classified  Stiffness of right knee, not elsewhere classified     Problem List Patient Active Problem List   Diagnosis Date Noted  . GERD (gastroesophageal reflux disease) 08/12/2019  . Bilateral primary osteoarthritis of knee 11/09/2016  . History of stroke 06/02/2016  . Osteoarthritis 03/25/2014  . Anxiety and depression 02/20/2012  . Chronic headache 02/20/2012  . Peripheral edema 02/19/2012  . Hyperparathyroidism ([place]) 10/23/2008  . ANEMIA 10/23/2008  . Essential hypertension 10/23/2008  . GALLSTONE PANCREATITIS 10/23/2008  . Chronic kidney disease 10/23/2008  . DEGENERATIVE JOINT DISEASE 10/23/2008  . PROTEINURIA 10/23/2008      Laureen Abrahams, PT, DPT 12/15/19 2:27 PM     [facility] [address] [place], [place], [address] Phone: [phone]   Fax:  [phone]  Name: [name] MRN: [id number] Date of Birth: 01/06/1931

## 2019-12-17 ENCOUNTER — Other Ambulatory Visit: Payer: Self-pay

## 2019-12-17 ENCOUNTER — Encounter: Payer: Self-pay | Admitting: Physical Therapy

## 2019-12-17 ENCOUNTER — Ambulatory Visit: Payer: Medicare HMO | Admitting: Physical Therapy

## 2019-12-17 DIAGNOSIS — M25662 Stiffness of left knee, not elsewhere classified: Secondary | ICD-10-CM

## 2019-12-17 DIAGNOSIS — M25562 Pain in left knee: Secondary | ICD-10-CM

## 2019-12-17 DIAGNOSIS — R262 Difficulty in walking, not elsewhere classified: Secondary | ICD-10-CM

## 2019-12-17 DIAGNOSIS — M25561 Pain in right knee: Secondary | ICD-10-CM | POA: Diagnosis not present

## 2019-12-17 DIAGNOSIS — M25661 Stiffness of right knee, not elsewhere classified: Secondary | ICD-10-CM

## 2019-12-17 DIAGNOSIS — G8929 Other chronic pain: Secondary | ICD-10-CM

## 2019-12-17 NOTE — Therapy (Signed)
[place], [place], [address] Phone: [phone]   Fax:  [phone]  Physical Therapy Treatment  Patient Details  Name: [name] MRN: [id number] Date of Birth: 01-21-1931 Referring Provider (PT): Leandrew Koyanagi MD   Encounter Date: 12/17/2019   PT End of Session - 12/17/19 1500    Visit Number 7    Number of Visits 16    PT Start Time 8768    PT Stop Time 1157    PT Time Calculation (min) 40 min    Activity Tolerance Patient tolerated treatment well;No increased pain;Patient limited by fatigue    Behavior During Therapy [facility] for tasks assessed/performed           Past Medical History:  Diagnosis Date   Anxiety    Arthritis    CKD (chronic kidney disease)    Dr. Hinda Lenis   Depression    GERD (gastroesophageal reflux disease)    Hyperlipidemia    Hypertension    Osteoporosis    Stroke [facility])    UTI (lower urinary tract infection)     Past Surgical History:  Procedure Laterality Date   CHOLECYSTECTOMY      There were no vitals filed for this visit.   Subjective Assessment - 12/17/19 1306    Subjective overall doing well, nothing to complain about at this time.    Patient is accompained by: Family member    Limitations Standing;Walking    How long can you stand comfortably? Not long (< 5 minutes)    How long can you walk comfortably? 15-20 min; pain avg 4-5/10    Patient Stated Goals Stand and walk better    Currently in Pain? Yes    Pain Score 4     Pain Location Knee    Pain Orientation Right;Left    Pain Descriptors / Indicators Aching;Tightness;Sore    Pain Type Chronic pain    Pain Onset More than a month ago    Pain Frequency Constant    Aggravating Factors  walking, weightbearing    Pain Relieving Factors sitting, resting                             [facility] Adult PT Treatment/Exercise - 12/17/19 1307      Knee/Hip Exercises: Aerobic   Nustep L6 x 10 min       Knee/Hip Exercises: Standing   Knee Flexion Both;20 reps    Knee Flexion Limitations 3#    Hip Abduction Both;20 reps    Abduction Limitations lateral taps; 3#    Hip Extension Both;20 reps    Extension Limitations with toe touch, 3#    Forward Step Up Both;1 set;10 reps;Hand Hold: 2;Step Height: 4"      Knee/Hip Exercises: Seated   Long Arc Quad Both;20 reps    Long Arc Quad Weight 3 lbs.    Hamstring Curl Both;20 reps    Hamstring Limitations L4 band    Sit to Sand 10 reps;with UE support   UEs on thighs                      PT Long Term Goals - 12/17/19 1500      PT LONG TERM GOAL #1   Title [name] will be able to stand and walk for at least 2X as long as she is currently (10 minutes from 5 minutes)    Baseline 9/22: 15-20 min  with pain ~ 4-5/10    Time 8    Period Weeks    Status Achieved      PT LONG TERM GOAL #2   Title [name] will report B knee pain consistently < 4/10 on the Numeric Pain Rating Scale.    Baseline Can be 5/10    Time 8    Period Weeks    Status On-going    Target Date 01/30/20      PT LONG TERM GOAL #3   Title [name] will be independent with her long-term maintenence HEP.    Time 8    Period Weeks    Status On-going    Target Date 01/30/20                 Plan - 12/17/19 1501    Clinical Impression Statement Pt reports increased standing and walking tolerance today, and overall tolerated session well.  Would like to progress HEP next session to standing exercises as well.  Will continue to benefit from PT to maximize function.    Personal Factors and Comorbidities Age;Fitness    Examination-Activity Limitations Dressing;Bed Mobility;Bend;Lift;Squat;Locomotion Level;Stairs;Carry;Stand    Examination-Participation Restrictions Cleaning;Community Activity;Driving;Meal Prep;Laundry    Stability/Clinical Decision Making Stable/Uncomplicated    Rehab Potential Fair    PT Frequency 2x / week    PT Duration 8 weeks    PT  Treatment/Interventions ADLs/Self Care Home Management;Cryotherapy;Therapeutic activities;Stair training;Gait training;Therapeutic exercise;Balance training;Neuromuscular re-education;Patient/family education;Manual techniques    PT Next Visit Plan continue with strengthening, standing tolerance as able; update HEP to progress exercises    PT Home Exercise Plan Access Code: [email or web address]    Consulted and Agree with Plan of Care Patient;Family member/caregiver    Family Member Consulted Son in law           Patient will benefit from skilled therapeutic intervention in order to improve the following deficits and impairments:  Abnormal gait, Decreased activity tolerance, Decreased endurance, Decreased range of motion, Decreased strength, Difficulty walking, Increased edema, Pain  Visit Diagnosis: Difficulty walking  Chronic pain of left knee  Chronic pain of right knee  Stiffness of left knee, not elsewhere classified  Stiffness of right knee, not elsewhere classified     Problem List Patient Active Problem List   Diagnosis Date Noted   GERD (gastroesophageal reflux disease) 08/12/2019   Bilateral primary osteoarthritis of knee 11/09/2016   History of stroke 06/02/2016   Osteoarthritis 03/25/2014   Anxiety and depression 02/20/2012   Chronic headache 02/20/2012   Peripheral edema 02/19/2012   Hyperparathyroidism ([place]) 10/23/2008   ANEMIA 10/23/2008   Essential hypertension 10/23/2008   GALLSTONE PANCREATITIS 10/23/2008   Chronic kidney disease 10/23/2008   DEGENERATIVE JOINT DISEASE 10/23/2008   PROTEINURIA 10/23/2008      Laureen Abrahams, PT, DPT 12/17/19 3:06 PM    [facility] [address] [place], [place], [address] Phone: [phone]   Fax:  [phone]  Name: [name] MRN: [id number] Date of Birth: 1930/04/04

## 2019-12-22 ENCOUNTER — Encounter: Payer: Medicare HMO | Admitting: Physical Therapy

## 2019-12-24 ENCOUNTER — Encounter: Payer: Medicare HMO | Admitting: Physical Therapy

## 2019-12-29 ENCOUNTER — Other Ambulatory Visit: Payer: Self-pay

## 2019-12-29 ENCOUNTER — Encounter: Payer: Self-pay | Admitting: Rehabilitative and Restorative Service Providers"

## 2019-12-29 ENCOUNTER — Ambulatory Visit (INDEPENDENT_AMBULATORY_CARE_PROVIDER_SITE_OTHER): Payer: Medicare HMO | Admitting: Rehabilitative and Restorative Service Providers"

## 2019-12-29 DIAGNOSIS — M25562 Pain in left knee: Secondary | ICD-10-CM

## 2019-12-29 DIAGNOSIS — R262 Difficulty in walking, not elsewhere classified: Secondary | ICD-10-CM | POA: Diagnosis not present

## 2019-12-29 DIAGNOSIS — M25661 Stiffness of right knee, not elsewhere classified: Secondary | ICD-10-CM | POA: Diagnosis not present

## 2019-12-29 DIAGNOSIS — G8929 Other chronic pain: Secondary | ICD-10-CM | POA: Diagnosis not present

## 2019-12-29 DIAGNOSIS — M25662 Stiffness of left knee, not elsewhere classified: Secondary | ICD-10-CM

## 2019-12-29 DIAGNOSIS — M25561 Pain in right knee: Secondary | ICD-10-CM | POA: Diagnosis not present

## 2019-12-29 NOTE — Therapy (Signed)
[name], [place], [address] Phone: [phone]   Fax:  [phone]  Physical Therapy Treatment-Functional Reassessment  Patient Details  Name: [name] [name] MRN: [id number] Date of Birth: 1930/11/11 Referring Provider (PT): Leandrew Koyanagi MD   Encounter Date: 12/29/2019   PT End of Session - 12/29/19 1516    Visit Number 8    Number of Visits 16    PT Start Time 2831    PT Stop Time 1512    PT Time Calculation (min) 48 min    Activity Tolerance Patient tolerated treatment well;No increased pain;Patient limited by fatigue    Behavior During Therapy [name] for tasks assessed/performed           Past Medical History:  Diagnosis Date  . Anxiety   . Arthritis   . CKD (chronic kidney disease)    Dr. Hinda [name]  . Depression   . GERD (gastroesophageal reflux disease)   . Hyperlipidemia   . Hypertension   . Osteoporosis   . Stroke ([place])   . UTI (lower urinary tract infection)     Past Surgical History:  Procedure Laterality Date  . CHOLECYSTECTOMY      There were no vitals filed for this visit.   Subjective Assessment - 12/29/19 1620    Subjective [name] [name] reports better standing and walking endurance since starting physical therapy.  She would like to continue.    Patient is accompained by: Family member    Limitations Standing;Walking    How long can you stand comfortably? More than before PT (< 5 minutes)    How long can you walk comfortably? 15-20 min; pain avg 4-5/10    Patient Stated Goals Stand and walk better    Currently in Pain? Yes    Pain Score 4     Pain Location Knee    Pain Orientation Left;Right    Pain Descriptors / Indicators Aching;Sore;Tightness    Pain Type Chronic pain    Pain Onset More than a month ago    Pain Frequency Constant    Aggravating Factors  Walking and WB    Pain Relieving Factors Resting    Effect of Pain on Daily Activities Limits standing and walking function                               [facility] Adult PT Treatment/Exercise - 12/29/19 0001      Therapeutic Activites    Therapeutic Activities ADL's    ADL's Stairs to get into house X 8 minutes      Neuro Re-ed    Neuro Re-ed Details  Tandem balance 4X 20 seconds      Exercises   Exercises Knee/Hip      Knee/Hip Exercises: Aerobic   Nustep L6 X 8 minutes      Knee/Hip Exercises: Seated   Long Arc Quad Strengthening;Both;2 sets;5 sets   slow eccentrics Seated straight leg raises   Other Seated Knee/Hip Exercises Tailgate knee flexion AROM 2 minutes    Sit to Sand 10 reps;with UE support   as needed slow eccentrics                 PT Education - 12/29/19 1622    Education Details Reviewed and performed HEP.  Emphasized quadriceps strengthening with [name]'s HEP.    Person(s) Educated Patient;Child(ren)    Methods Explanation;Demonstration;Tactile cues;Verbal cues    Comprehension Verbalized understanding;Returned demonstration;Verbal cues  required;Tactile cues required;Need further instruction               PT Long Term Goals - 12/29/19 1623      PT LONG TERM GOAL #1   Title [name] will be able to stand and walk for at least 2X as long as she is currently (10 minutes from 5 minutes)    Baseline 9/22: 15-20 min with pain ~ 4-5/10    Time 8    Period Weeks    Status Partially Met      PT LONG TERM GOAL #2   Title [name] [name] will report B knee pain consistently < 4/10 on the Numeric Pain Rating Scale.    Baseline Can be 5/10    Time 8    Period Weeks    Status Partially Met      PT LONG TERM GOAL #3   Title [name] [name] will be independent with her long-term maintenence HEP.    Time 8    Period Weeks    Status On-going                 Plan - 12/29/19 1615    Clinical Impression Statement [name] [name] notes better standing and walking endurance since starting PT.  She still notes pain with both activities, she can do both longer before needing to stop.  She would  like to continue B LE (quadriceps emphasis) strength work as recommended at evaluation before transfer into more independent physical therapy.    Personal Factors and Comorbidities Age;Fitness    Examination-Activity Limitations Dressing;Bed Mobility;Bend;Lift;Squat;Locomotion Level;Stairs;[name];Stand    Examination-Participation Restrictions Cleaning;Community Activity;Driving;Meal Prep;Laundry    Stability/Clinical Decision Making Stable/Uncomplicated    Rehab Potential Fair    PT Frequency 2x / week    PT Duration 8 weeks    PT Treatment/Interventions ADLs/Self Care Home Management;Cryotherapy;Therapeutic activities;Stair training;Gait training;Therapeutic exercise;Balance training;Neuromuscular re-education;Patient/family education;Manual techniques    PT Next Visit Plan continue with strengthening, standing tolerance as able; update HEP to progress exercises    PT Home Exercise Plan Access Code: [email or web address]    Consulted and Agree with Plan of Care Patient;Family member/caregiver    Family Member Consulted Son in law           Patient will benefit from skilled therapeutic intervention in order to improve the following deficits and impairments:  Abnormal gait, Decreased activity tolerance, Decreased endurance, Decreased range of motion, Decreased strength, Difficulty walking, Increased edema, Pain  Visit Diagnosis: Difficulty walking  Chronic pain of left knee  Chronic pain of right knee  Stiffness of left knee, not elsewhere classified  Stiffness of right knee, not elsewhere classified     Problem List Patient Active Problem List   Diagnosis Date Noted  . GERD (gastroesophageal reflux disease) 08/12/2019  . Bilateral primary osteoarthritis of knee 11/09/2016  . History of stroke 06/02/2016  . Osteoarthritis 03/25/2014  . Anxiety and depression 02/20/2012  . Chronic headache 02/20/2012  . Peripheral edema 02/19/2012  . Hyperparathyroidism ([place]) 10/23/2008  . ANEMIA  10/23/2008  . Essential hypertension 10/23/2008  . GALLSTONE PANCREATITIS 10/23/2008  . Chronic kidney disease 10/23/2008  . DEGENERATIVE JOINT DISEASE 10/23/2008  . PROTEINURIA 10/23/2008    [name] [name] PT, MPT 12/29/2019, 4:24 PM  [facility] [address] [place], [place], [address] Phone: [phone]   Fax:  [phone]  Name: [name] [name] MRN: [id number] Date of Birth: [birth year]

## 2019-12-31 ENCOUNTER — Other Ambulatory Visit: Payer: Self-pay

## 2019-12-31 ENCOUNTER — Encounter: Payer: Self-pay | Admitting: Physical Therapy

## 2019-12-31 ENCOUNTER — Ambulatory Visit: Payer: Medicare HMO | Admitting: Physical Therapy

## 2019-12-31 DIAGNOSIS — M25562 Pain in left knee: Secondary | ICD-10-CM | POA: Diagnosis not present

## 2019-12-31 DIAGNOSIS — R262 Difficulty in walking, not elsewhere classified: Secondary | ICD-10-CM

## 2019-12-31 DIAGNOSIS — M25661 Stiffness of right knee, not elsewhere classified: Secondary | ICD-10-CM | POA: Diagnosis not present

## 2019-12-31 DIAGNOSIS — M25561 Pain in right knee: Secondary | ICD-10-CM | POA: Diagnosis not present

## 2019-12-31 DIAGNOSIS — M25662 Stiffness of left knee, not elsewhere classified: Secondary | ICD-10-CM | POA: Diagnosis not present

## 2019-12-31 DIAGNOSIS — G8929 Other chronic pain: Secondary | ICD-10-CM | POA: Diagnosis not present

## 2019-12-31 NOTE — Therapy (Signed)
[place], [place], [address] Phone: [phone]   Fax:  [phone]  Physical Therapy Treatment/Discharge Summary  Patient Details  Name: SALIMAH MARTINOVICH MRN: [id number] Date of Birth: [birth year] Referring Provider (PT): Leandrew Koyanagi MD   Encounter Date: 12/31/2019   PT End of Session - 12/31/19 1510    Visit Number 9    Number of Visits 16    PT Start Time 2355    PT Stop Time 1505    PT Time Calculation (min) 40 min    Activity Tolerance Patient tolerated treatment well;No increased pain;Patient limited by fatigue    Behavior During Therapy [facility] for tasks assessed/performed           Past Medical History:  Diagnosis Date  . Anxiety   . Arthritis   . CKD (chronic kidney disease)    Dr. Hinda Lenis  . Depression   . GERD (gastroesophageal reflux disease)   . Hyperlipidemia   . Hypertension   . Osteoporosis   . Stroke ([place])   . UTI (lower urinary tract infection)     Past Surgical History:  Procedure Laterality Date  . CHOLECYSTECTOMY      There were no vitals filed for this visit.   Subjective Assessment - 12/31/19 1428    Subjective feels like she is increasing her activity and endurance    Patient is accompained by: Family member    Limitations Standing;Walking    How long can you stand comfortably? More than before PT (< 5 minutes)    How long can you walk comfortably? 15-20 min; pain avg 4-5/10    Patient Stated Goals Stand and walk better    Currently in Pain? Yes    Pain Score 4     Pain Location Knee    Pain Orientation Right;Left    Pain Descriptors / Indicators Aching;Sore;Tightness    Pain Type Chronic pain    Pain Onset More than a month ago    Pain Frequency Constant    Aggravating Factors  walking and weight bearing    Pain Relieving Factors resting                             [facility] Adult PT Treatment/Exercise - 12/31/19 1429      Knee/Hip Exercises: Aerobic    Nustep L6 x 10 min      Knee/Hip Exercises: Standing   Heel Raises Both;20 reps    Hip Abduction Both;20 reps    Abduction Limitations lateral taps    Hip Extension Both;20 reps    Extension Limitations with toe touch      Knee/Hip Exercises: Seated   Other Seated Knee/Hip Exercises seated SLR 2x10 reps; 3 sec hold    Sit to Sand 10 reps;with UE support      Knee/Hip Exercises: Supine   Short Arc Quad Sets Both;10 reps    Straight Leg Raises Both;10 reps                  PT Education - 12/31/19 1503    Education Details HEP    Person(s) Educated Patient    Methods Explanation;Demonstration;Handout    Comprehension Verbalized understanding;Returned demonstration;Need further instruction               PT Long Term Goals - 12/31/19 1510      PT LONG TERM GOAL #1   Title Laresha will be able to stand  and walk for at least 2X as long as she is currently (10 minutes from 5 minutes)    Time 8    Period Weeks    Status Achieved      PT LONG TERM GOAL #2   Title Texie will report B knee pain consistently < 4/10 on the Numeric Pain Rating Scale.    Baseline still elevated but able to increase activity    Time 8    Period Weeks    Status Not Met      PT LONG TERM GOAL #3   Title Georganna will be independent with her long-term maintenence HEP.    Time 8    Period Weeks    Status Achieved                 Plan - 12/31/19 1511    Clinical Impression Statement Pt as met 2/3 LTGs and at this time her and family are requesting d/c from PT and will request new PT referral PRN.  Will d/c PT today.    Personal Factors and Comorbidities Age;Fitness    Examination-Activity Limitations Dressing;Bed Mobility;Bend;Lift;Squat;Locomotion Level;Stairs;Carry;Stand    Examination-Participation Restrictions Cleaning;Community Activity;Driving;Meal Prep;Laundry    Stability/Clinical Decision Making Stable/Uncomplicated    Rehab Potential Fair    PT Frequency 2x / week    PT  Duration 8 weeks    PT Treatment/Interventions ADLs/Self Care Home Management;Cryotherapy;Therapeutic activities;Stair training;Gait training;Therapeutic exercise;Balance training;Neuromuscular re-education;Patient/family education;Manual techniques    PT Next Visit Plan d/c PT today    PT Home Exercise Plan Access Code: [email or web address]    Consulted and Agree with Plan of Care Patient;Family member/caregiver    Family Member Consulted Son in law           Patient will benefit from skilled therapeutic intervention in order to improve the following deficits and impairments:  Abnormal gait, Decreased activity tolerance, Decreased endurance, Decreased range of motion, Decreased strength, Difficulty walking, Increased edema, Pain  Visit Diagnosis: Difficulty walking  Chronic pain of left knee  Chronic pain of right knee  Stiffness of left knee, not elsewhere classified  Stiffness of right knee, not elsewhere classified     Problem List Patient Active Problem List   Diagnosis Date Noted  . GERD (gastroesophageal reflux disease) 08/12/2019  . Bilateral primary osteoarthritis of knee 11/09/2016  . History of stroke 06/02/2016  . Osteoarthritis 03/25/2014  . Anxiety and depression 02/20/2012  . Chronic headache 02/20/2012  . Peripheral edema 02/19/2012  . Hyperparathyroidism ([place]) 10/23/2008  . ANEMIA 10/23/2008  . Essential hypertension 10/23/2008  . GALLSTONE PANCREATITIS 10/23/2008  . Chronic kidney disease 10/23/2008  . DEGENERATIVE JOINT DISEASE 10/23/2008  . PROTEINURIA 10/23/2008     Laureen Abrahams, PT, DPT 12/31/19 3:12 PM    [facility] [address] [place], [place], [address] Phone: [phone]   Fax:  [phone]  Name: LEDORA DELKER MRN: [id number] Date of Birth: [birth year]     PHYSICAL THERAPY DISCHARGE SUMMARY  Visits from Start of Care: 9  Current functional level related to goals / functional  outcomes: See above   Remaining deficits: See above   Education / Equipment: HEP  Plan: Patient agrees to discharge.  Patient goals were partially met. Patient is being discharged due to being pleased with the current functional level.  ?????    Laureen Abrahams, PT, DPT 12/31/19 3:13 PM  [facility] [address] [place], [place], [address] Phone: [phone]   Fax:  [phone]

## 2019-12-31 NOTE — Patient Instructions (Signed)
Access Code: [email or web address] URL: [email or web address] Date: 12/31/2019 Prepared by: Faustino Congress  Exercises Small Range Straight Leg Raise - 1 x daily - 7 x weekly - 6 sets - 5 reps - 3 sec hold Supine Short Arc Quad - 1 x daily - 7 x weekly - 1 sets - 20 reps - 5 sec hold Seated Long Arc Quad - 1 x daily - 7 x weekly - 3 sets - 10 reps - 3 sec hold Sidelying Hip Abduction - 1 x daily - 7 x weekly - 1 sets - 10 reps Sit to Stand - 1 x daily - 7 x weekly - 1 sets - 10 reps Standing Hip Abduction with Counter Support - 1 x daily - 7 x weekly - 20 reps - 1 sets Standing Hip Extension with Counter Support - 1 x daily - 7 x weekly - 20 reps - 1 sets Heel rises with counter support - 1 x daily - 7 x weekly - 20 reps - 1 sets

## 2020-01-06 ENCOUNTER — Other Ambulatory Visit: Payer: Self-pay | Admitting: Family Medicine

## 2020-01-06 NOTE — Telephone Encounter (Signed)
Ok to refill??  Last office visit 11/04/2019.  Last refill 08/12/2019, #2 refills.

## 2020-01-07 ENCOUNTER — Other Ambulatory Visit: Payer: Self-pay | Admitting: *Deleted

## 2020-01-08 ENCOUNTER — Other Ambulatory Visit: Payer: Self-pay | Admitting: Family Medicine

## 2020-01-09 DIAGNOSIS — E211 Secondary hyperparathyroidism, not elsewhere classified: Secondary | ICD-10-CM | POA: Diagnosis not present

## 2020-01-09 DIAGNOSIS — N184 Chronic kidney disease, stage 4 (severe): Secondary | ICD-10-CM | POA: Diagnosis not present

## 2020-01-09 DIAGNOSIS — E559 Vitamin D deficiency, unspecified: Secondary | ICD-10-CM | POA: Diagnosis not present

## 2020-01-09 DIAGNOSIS — R600 Localized edema: Secondary | ICD-10-CM | POA: Diagnosis not present

## 2020-01-09 DIAGNOSIS — R809 Proteinuria, unspecified: Secondary | ICD-10-CM | POA: Diagnosis not present

## 2020-01-09 MED ORDER — LABETALOL HCL 300 MG PO TABS
ORAL_TABLET | ORAL | 2 refills | Status: DC
Start: 2020-01-09 — End: 2020-01-09

## 2020-01-09 MED ORDER — LABETALOL HCL 300 MG PO TABS
ORAL_TABLET | ORAL | 2 refills | Status: DC
Start: 2020-01-09 — End: 2020-01-12

## 2020-01-09 NOTE — Addendum Note (Signed)
Addended by: Vic Blackbird F on: 01/09/2020 09:14 PM   Modules accepted: Orders

## 2020-01-09 NOTE — Addendum Note (Signed)
Addended by: Vic Blackbird F on: 01/09/2020 09:10 PM   Modules accepted: Orders

## 2020-01-12 ENCOUNTER — Encounter: Payer: Self-pay | Admitting: Family Medicine

## 2020-01-12 ENCOUNTER — Other Ambulatory Visit: Payer: Self-pay

## 2020-01-12 ENCOUNTER — Ambulatory Visit (INDEPENDENT_AMBULATORY_CARE_PROVIDER_SITE_OTHER): Payer: Medicare HMO | Admitting: Family Medicine

## 2020-01-12 VITALS — BP 138/72 | HR 66 | Temp 98.7°F | Resp 14 | Ht <= 58 in | Wt 146.0 lb

## 2020-01-12 DIAGNOSIS — I10 Essential (primary) hypertension: Secondary | ICD-10-CM | POA: Diagnosis not present

## 2020-01-12 DIAGNOSIS — F32A Depression, unspecified: Secondary | ICD-10-CM

## 2020-01-12 DIAGNOSIS — M8949 Other hypertrophic osteoarthropathy, multiple sites: Secondary | ICD-10-CM | POA: Diagnosis not present

## 2020-01-12 DIAGNOSIS — N1831 Chronic kidney disease, stage 3a: Secondary | ICD-10-CM | POA: Diagnosis not present

## 2020-01-12 DIAGNOSIS — F419 Anxiety disorder, unspecified: Secondary | ICD-10-CM

## 2020-01-12 DIAGNOSIS — R69 Illness, unspecified: Secondary | ICD-10-CM | POA: Diagnosis not present

## 2020-01-12 DIAGNOSIS — Z8673 Personal history of transient ischemic attack (TIA), and cerebral infarction without residual deficits: Secondary | ICD-10-CM

## 2020-01-12 DIAGNOSIS — Z0001 Encounter for general adult medical examination with abnormal findings: Secondary | ICD-10-CM | POA: Diagnosis not present

## 2020-01-12 DIAGNOSIS — M159 Polyosteoarthritis, unspecified: Secondary | ICD-10-CM

## 2020-01-12 DIAGNOSIS — Z23 Encounter for immunization: Secondary | ICD-10-CM | POA: Diagnosis not present

## 2020-01-12 DIAGNOSIS — Z0000 Encounter for general adult medical examination without abnormal findings: Secondary | ICD-10-CM

## 2020-01-12 MED ORDER — LABETALOL HCL 300 MG PO TABS: ORAL_TABLET | ORAL | 2 refills | Status: DC

## 2020-01-12 MED ORDER — POLYETHYLENE GLYCOL 3350 17 GM/SCOOP PO POWD: 17.0000 g | Freq: Every day | ORAL | 1 refills | Status: DC

## 2020-01-12 NOTE — Patient Instructions (Addendum)
Miralax 1 cap full daily for constipation I have sent as a prescription Keep taking pain medications as prescribed for her neck and knees  Eat more in the morning  F/U 2-3 months for weight

## 2020-01-12 NOTE — Progress Notes (Signed)
Subjective:   Patient presents for Medicare Annual/Subsequent preventive examination.     Pt here for wellness visit , here with son      She is currently in PT for walking and knees , she has severe DDD/OA in knees, neck, she declines any further intervention, taking meds as needed  no recent falls        HTN - she is taking BP meds as prescribed      CKD- she is followed by nephrology, had recent visit and labs done , they showed me copies cR 2.0 , hB STABLE 11-12   Constipated more the past few months, has not taken any OTC, has to strain, no blood in stools      Review Past Medical/Family/Social: Per EMR    Risk Factors  Current exercise habits: none  Dietary issues discussed: No major concerns   Cardiac risk factors: Obesity (BMI >= 30 kg/m2).   Depression Screen  (Note: if answer to either of the following is "Yes", a more complete depression screening is indicated)  Over the past two weeks, have you felt down, depressed or hopeless? No Over the past two weeks, have you felt little interest or pleasure in doing things? No Have you lost interest or pleasure in daily life? No Do you often feel hopeless? No Do you cry easily over simple problems? No   Activities of Daily Living  In your present state of health, do you have any difficulty performing the following activities?:  Driving? Yes  Managing money? No  Feeding yourself? No  Getting from bed to chair? No  Climbing a flight of stairs? Yes Preparing food and eating?: No  Bathing or showering? No  Getting dressed: No  Getting to the toilet? No  Using the toilet:No  Moving around from place to place: No  In the past year have you fallen or had a near fall?:No    Hearing Difficulties: No  Do you often ask people to speak up or repeat themselves? No  Do you experience ringing or noises in your ears? No Do you have difficulty understanding soft or whispered voices? No  Do you feel that you have a problem with  memory? No Do you often misplace items? No  Do you feel safe at home? Yes  Cognitive Testing  Alert? Yes Normal Appearance?Yes  Oriented to person? Yes Place? Yes  Time? Yes  Recall of three objects? Yes  Can perform simple calculations? Yes  Displays appropriate judgment?Yes  Can read the correct time from a watch face?Yes   List the Names of Other Physician/Practitioners you currently use:  Orthopedics   Screening Tests / Date COVID-19 UTD  Influenza Vaccine DUE PNA- UTD  Tetanus/tdap - declines   ROS:  GEN- denies fatigue, fever, weight loss,weakness, recent illness HEENT- denies eye drainage, change in vision, nasal discharge, CVS- denies chest pain, palpitations RESP- denies SOB, cough, wheeze ABD- denies N/V, change in stools, abd pain GU- denies dysuria, hematuria, dribbling, incontinence MSK- denies joint pain, muscle aches, injury Neuro- denies headache, dizziness, syncope, seizure activity  PHYSICAL: Vitals reviewed  GEN- NAD, alert and oriented x3 HEENT- PERRL, EOMI, non injected sclera, pink conjunctiva, MMM, oropharynx clear Neck- Supple, no thryomegaly CVS- RRR, no murmur RESP-CTAB ABD-NABS,soft,nt,nd EXT- trace  edema Pulses- Radial, DP- 2+    Assessment:    Annual wellness medicare exam   Plan:    During the course of the visit the patient was educated and counseled about appropriate screening  and preventive services including:  Moderate fall risk in PT, walks with assistance  /AUDIT C/DEPRESSION screen neg  H/O Stroke, Bp controlled, no abnormal bleeding on aggrenox    DDD/OA neck, back, knees- continue tylenol #3 for severe pain  CKD- followed by nephrology, taken off vitamin D, hb stable, no NSAIDS  Daughter POA/has livign will   Chronic insomnia prn ativan with Anxiety history   Constipation- trial of miralax   Flu shot given      Diet review for nutrition referral? Yes ____ Not Indicated __x__  Patient Instructions (the  written plan) was given to the patient.  Medicare Attestation  I have personally reviewed:  The patient's medical and social history  Their use of alcohol, tobacco or illicit drugs  Their current medications and supplements  The patient's functional ability including ADLs,fall risks, home safety risks, cognitive, and hearing and visual impairment  Diet and physical activities  Evidence for depression or mood disorders  The patient's weight, height, BMI, and visual acuity have been recorded in the chart. I have made referrals, counseling, and provided education to the patient based on review of the above and I have provided the patient with a written personalized care plan for preventive services.

## 2020-01-12 NOTE — Addendum Note (Signed)
Addended by: Sheral Flow on: 01/12/2020 08:17 AM   Modules accepted: Orders

## 2020-01-14 DIAGNOSIS — I952 Hypotension due to drugs: Secondary | ICD-10-CM | POA: Diagnosis not present

## 2020-01-14 DIAGNOSIS — E211 Secondary hyperparathyroidism, not elsewhere classified: Secondary | ICD-10-CM | POA: Diagnosis not present

## 2020-01-14 DIAGNOSIS — R809 Proteinuria, unspecified: Secondary | ICD-10-CM | POA: Diagnosis not present

## 2020-01-14 DIAGNOSIS — N184 Chronic kidney disease, stage 4 (severe): Secondary | ICD-10-CM | POA: Diagnosis not present

## 2020-01-14 DIAGNOSIS — N170 Acute kidney failure with tubular necrosis: Secondary | ICD-10-CM | POA: Diagnosis not present

## 2020-01-21 DIAGNOSIS — I952 Hypotension due to drugs: Secondary | ICD-10-CM | POA: Diagnosis not present

## 2020-01-21 DIAGNOSIS — N170 Acute kidney failure with tubular necrosis: Secondary | ICD-10-CM | POA: Diagnosis not present

## 2020-01-21 DIAGNOSIS — N184 Chronic kidney disease, stage 4 (severe): Secondary | ICD-10-CM | POA: Diagnosis not present

## 2020-01-21 DIAGNOSIS — E211 Secondary hyperparathyroidism, not elsewhere classified: Secondary | ICD-10-CM | POA: Diagnosis not present

## 2020-01-21 DIAGNOSIS — R809 Proteinuria, unspecified: Secondary | ICD-10-CM | POA: Diagnosis not present

## 2020-01-28 DIAGNOSIS — N170 Acute kidney failure with tubular necrosis: Secondary | ICD-10-CM | POA: Diagnosis not present

## 2020-01-28 DIAGNOSIS — R809 Proteinuria, unspecified: Secondary | ICD-10-CM | POA: Diagnosis not present

## 2020-01-28 DIAGNOSIS — N184 Chronic kidney disease, stage 4 (severe): Secondary | ICD-10-CM | POA: Diagnosis not present

## 2020-01-28 DIAGNOSIS — I129 Hypertensive chronic kidney disease with stage 1 through stage 4 chronic kidney disease, or unspecified chronic kidney disease: Secondary | ICD-10-CM | POA: Diagnosis not present

## 2020-01-28 DIAGNOSIS — E211 Secondary hyperparathyroidism, not elsewhere classified: Secondary | ICD-10-CM | POA: Diagnosis not present

## 2020-03-01 ENCOUNTER — Other Ambulatory Visit: Payer: Self-pay | Admitting: Family Medicine

## 2020-03-01 MED ORDER — TRAMADOL HCL 50 MG PO TABS
50.0000 mg | ORAL_TABLET | Freq: Four times a day (QID) | ORAL | 1 refills | Status: DC | PRN
Start: 2020-03-01 — End: 2020-06-15

## 2020-03-01 NOTE — Telephone Encounter (Signed)
Ok to refill??  Last office visit 01/12/2020.  Last refill 08/12/2019, #1 refill.

## 2020-03-01 NOTE — Telephone Encounter (Signed)
Refill Tramadol 

## 2020-03-30 DIAGNOSIS — R809 Proteinuria, unspecified: Secondary | ICD-10-CM | POA: Diagnosis not present

## 2020-03-30 DIAGNOSIS — N170 Acute kidney failure with tubular necrosis: Secondary | ICD-10-CM | POA: Diagnosis not present

## 2020-03-30 DIAGNOSIS — I129 Hypertensive chronic kidney disease with stage 1 through stage 4 chronic kidney disease, or unspecified chronic kidney disease: Secondary | ICD-10-CM | POA: Diagnosis not present

## 2020-03-30 DIAGNOSIS — N184 Chronic kidney disease, stage 4 (severe): Secondary | ICD-10-CM | POA: Diagnosis not present

## 2020-03-30 DIAGNOSIS — E211 Secondary hyperparathyroidism, not elsewhere classified: Secondary | ICD-10-CM | POA: Diagnosis not present

## 2020-04-01 DIAGNOSIS — I952 Hypotension due to drugs: Secondary | ICD-10-CM | POA: Diagnosis not present

## 2020-04-01 DIAGNOSIS — R809 Proteinuria, unspecified: Secondary | ICD-10-CM | POA: Diagnosis not present

## 2020-04-01 DIAGNOSIS — N170 Acute kidney failure with tubular necrosis: Secondary | ICD-10-CM | POA: Diagnosis not present

## 2020-04-01 DIAGNOSIS — N184 Chronic kidney disease, stage 4 (severe): Secondary | ICD-10-CM | POA: Diagnosis not present

## 2020-04-16 DIAGNOSIS — R809 Proteinuria, unspecified: Secondary | ICD-10-CM | POA: Diagnosis not present

## 2020-04-16 DIAGNOSIS — N170 Acute kidney failure with tubular necrosis: Secondary | ICD-10-CM | POA: Diagnosis not present

## 2020-04-16 DIAGNOSIS — I952 Hypotension due to drugs: Secondary | ICD-10-CM | POA: Diagnosis not present

## 2020-04-16 DIAGNOSIS — N184 Chronic kidney disease, stage 4 (severe): Secondary | ICD-10-CM | POA: Diagnosis not present

## 2020-04-19 ENCOUNTER — Other Ambulatory Visit: Payer: Self-pay

## 2020-04-19 ENCOUNTER — Ambulatory Visit (INDEPENDENT_AMBULATORY_CARE_PROVIDER_SITE_OTHER): Payer: Medicare HMO | Admitting: Family Medicine

## 2020-04-19 ENCOUNTER — Encounter: Payer: Self-pay | Admitting: Family Medicine

## 2020-04-19 DIAGNOSIS — E441 Mild protein-calorie malnutrition: Secondary | ICD-10-CM

## 2020-04-19 DIAGNOSIS — Z8673 Personal history of transient ischemic attack (TIA), and cerebral infarction without residual deficits: Secondary | ICD-10-CM | POA: Diagnosis not present

## 2020-04-19 DIAGNOSIS — N1831 Chronic kidney disease, stage 3a: Secondary | ICD-10-CM | POA: Diagnosis not present

## 2020-04-19 DIAGNOSIS — E46 Unspecified protein-calorie malnutrition: Secondary | ICD-10-CM | POA: Insufficient documentation

## 2020-04-19 DIAGNOSIS — I10 Essential (primary) hypertension: Secondary | ICD-10-CM | POA: Diagnosis not present

## 2020-04-19 NOTE — Assessment & Plan Note (Signed)
Maintaining weight, continue daily supplement

## 2020-04-19 NOTE — Patient Instructions (Signed)
Continue current medications Weight is stable   [facility]- Dr. Cecille Rubin [facility]- Dr. Garret Reddish   F/U 4 months [name]

## 2020-04-19 NOTE — Assessment & Plan Note (Addendum)
Controlled no changes  Continue low dose metoprolol  no longer on norvasc d/c by nephrology

## 2020-04-19 NOTE — Progress Notes (Signed)
   Subjective:    Patient ID: [name], female    DOB: [birth year], 87 y.o.   MRN: [id number]  Patient presents for Follow-up (weight)  Patient here to follow-up medications and weight.  Her weight at her visit in October was 146 pounds.  She has maintained this with the past few months.  She typically eats 2 big meals a day she drinks Ensure in the middle of the day.  Her supper is her largest meal.  Her family will try to make sure she has both veggies and protein.  Her constipation has improved she now has a bowel movement most days a week.  Primary osteoarthritis of the knee she continues to use tramadol Tylenol with codeine as severe.  She has not had any recent falls.  Chronic kidney disease she has follow-up with her nephrologist this Thursday she had labs drawn already.  Do not have the results of these.  No new concerns today.   Review Of Systems:  GEN- denies fatigue, fever, weight loss,weakness, recent illness HEENT- denies eye drainage, change in vision, nasal discharge, CVS- denies chest pain, palpitations RESP- denies SOB, cough, wheeze ABD- denies N/V, change in stools, abd pain GU- denies dysuria, hematuria, dribbling, incontinence MSK- +joint pain, muscle aches, injury Neuro- denies headache, dizziness, syncope, seizure activity       Objective:    BP 132/70   Pulse 76   Temp 99 F (37.2 C) (Temporal)   Resp 16   Ht 4\' 9"  (1.448 m)   Wt 146 lb (66.2 kg)   SpO2 94%   BMI 31.59 kg/m  GEN- NAD, alert and oriented x3,EXAMINED in chair  HEENT- PERRL, EOMI, non injected sclera, pink conjunctiva, MMM, oropharynx clear Neck- Supple, no thyromegaly CVS- RRR, no murmur RESP-CTAB ABD-NABS,soft,NT,ND EXT- trace  edema Pulses- Radial  2+        Assessment & Plan:      Problem List Items Addressed This Visit      Unprioritized   Chronic kidney disease    F/u nephrology this week, labs Await OV note       Essential hypertension    Controlled no  changes  Continue low dose metoprolol  no longer on norvasc d/c by nephrology       Relevant Medications   labetalol (NORMODYNE) 100 MG tablet   History of stroke    Tolerating aggrenox, no abnormal bleeding      Protein-calorie malnutrition ([place])    Maintaining weight, continue daily supplement          Note: This dictation was prepared with Dragon dictation along with smaller phrase technology. Any transcriptional errors that result from this process are unintentional.

## 2020-04-19 NOTE — Assessment & Plan Note (Signed)
Tolerating aggrenox, no abnormal bleeding

## 2020-04-19 NOTE — Assessment & Plan Note (Signed)
F/u nephrology this week, labs Await OV note

## 2020-04-22 DIAGNOSIS — I129 Hypertensive chronic kidney disease with stage 1 through stage 4 chronic kidney disease, or unspecified chronic kidney disease: Secondary | ICD-10-CM | POA: Diagnosis not present

## 2020-04-22 DIAGNOSIS — E211 Secondary hyperparathyroidism, not elsewhere classified: Secondary | ICD-10-CM | POA: Diagnosis not present

## 2020-04-22 DIAGNOSIS — R809 Proteinuria, unspecified: Secondary | ICD-10-CM | POA: Diagnosis not present

## 2020-04-22 DIAGNOSIS — N184 Chronic kidney disease, stage 4 (severe): Secondary | ICD-10-CM | POA: Diagnosis not present

## 2020-05-10 ENCOUNTER — Other Ambulatory Visit: Payer: Self-pay | Admitting: Orthopaedic Surgery

## 2020-05-10 ENCOUNTER — Other Ambulatory Visit: Payer: Self-pay | Admitting: Family Medicine

## 2020-05-10 MED ORDER — ACETAMINOPHEN-CODEINE #3 300-30 MG PO TABS: ORAL_TABLET | ORAL | 2 refills | Status: DC

## 2020-05-10 NOTE — Telephone Encounter (Signed)
Ok to refill??  Last office visit 04/19/2020.  Last refill 01/06/2020, #2 refills.

## 2020-05-10 NOTE — Telephone Encounter (Signed)
Pt daughter called stating that her mom needed a refill of  acetaminophen-codeine (TYLENOL #3) 300-30 MG  She stated that her mom is down to her last pill today. Please send refill to the [facility] on [address] in [place].  Cb#: [phone]

## 2020-05-10 NOTE — Telephone Encounter (Signed)
Ok to send in with same previous instructions

## 2020-05-12 DIAGNOSIS — R809 Proteinuria, unspecified: Secondary | ICD-10-CM | POA: Diagnosis not present

## 2020-05-12 DIAGNOSIS — N184 Chronic kidney disease, stage 4 (severe): Secondary | ICD-10-CM | POA: Diagnosis not present

## 2020-05-12 DIAGNOSIS — I129 Hypertensive chronic kidney disease with stage 1 through stage 4 chronic kidney disease, or unspecified chronic kidney disease: Secondary | ICD-10-CM | POA: Diagnosis not present

## 2020-05-12 DIAGNOSIS — E211 Secondary hyperparathyroidism, not elsewhere classified: Secondary | ICD-10-CM | POA: Diagnosis not present

## 2020-05-19 DIAGNOSIS — R600 Localized edema: Secondary | ICD-10-CM | POA: Diagnosis not present

## 2020-05-19 DIAGNOSIS — N184 Chronic kidney disease, stage 4 (severe): Secondary | ICD-10-CM | POA: Diagnosis not present

## 2020-05-19 DIAGNOSIS — E211 Secondary hyperparathyroidism, not elsewhere classified: Secondary | ICD-10-CM | POA: Diagnosis not present

## 2020-05-19 DIAGNOSIS — R809 Proteinuria, unspecified: Secondary | ICD-10-CM | POA: Diagnosis not present

## 2020-05-19 DIAGNOSIS — I129 Hypertensive chronic kidney disease with stage 1 through stage 4 chronic kidney disease, or unspecified chronic kidney disease: Secondary | ICD-10-CM | POA: Diagnosis not present

## 2020-06-15 ENCOUNTER — Other Ambulatory Visit: Payer: Self-pay

## 2020-06-15 ENCOUNTER — Other Ambulatory Visit: Payer: Self-pay | Admitting: Family Medicine

## 2020-06-15 NOTE — Telephone Encounter (Signed)
;  Ok to refill??  Last office visit 04/19/2020.  Last refill 03/01/2020.

## 2020-06-25 ENCOUNTER — Other Ambulatory Visit: Payer: Self-pay | Admitting: Family Medicine

## 2020-07-09 DIAGNOSIS — N184 Chronic kidney disease, stage 4 (severe): Secondary | ICD-10-CM | POA: Diagnosis not present

## 2020-07-09 DIAGNOSIS — E211 Secondary hyperparathyroidism, not elsewhere classified: Secondary | ICD-10-CM | POA: Diagnosis not present

## 2020-07-09 DIAGNOSIS — R809 Proteinuria, unspecified: Secondary | ICD-10-CM | POA: Diagnosis not present

## 2020-07-09 DIAGNOSIS — I129 Hypertensive chronic kidney disease with stage 1 through stage 4 chronic kidney disease, or unspecified chronic kidney disease: Secondary | ICD-10-CM | POA: Diagnosis not present

## 2020-07-15 DIAGNOSIS — N184 Chronic kidney disease, stage 4 (severe): Secondary | ICD-10-CM | POA: Diagnosis not present

## 2020-07-15 DIAGNOSIS — E211 Secondary hyperparathyroidism, not elsewhere classified: Secondary | ICD-10-CM | POA: Diagnosis not present

## 2020-07-15 DIAGNOSIS — R600 Localized edema: Secondary | ICD-10-CM | POA: Diagnosis not present

## 2020-07-15 DIAGNOSIS — I129 Hypertensive chronic kidney disease with stage 1 through stage 4 chronic kidney disease, or unspecified chronic kidney disease: Secondary | ICD-10-CM | POA: Diagnosis not present

## 2020-08-13 ENCOUNTER — Other Ambulatory Visit: Payer: Self-pay | Admitting: Family Medicine

## 2020-08-17 ENCOUNTER — Ambulatory Visit: Payer: Medicare HMO | Admitting: Nurse Practitioner

## 2020-08-26 ENCOUNTER — Other Ambulatory Visit: Payer: Self-pay

## 2020-08-26 ENCOUNTER — Encounter: Payer: Self-pay | Admitting: Nurse Practitioner

## 2020-08-26 ENCOUNTER — Ambulatory Visit (INDEPENDENT_AMBULATORY_CARE_PROVIDER_SITE_OTHER): Payer: Medicare HMO | Admitting: Nurse Practitioner

## 2020-08-26 VITALS — BP 126/60 | HR 65 | Temp 97.9°F | Ht <= 58 in | Wt 142.6 lb

## 2020-08-26 DIAGNOSIS — E7439 Other disorders of intestinal carbohydrate absorption: Secondary | ICD-10-CM | POA: Diagnosis not present

## 2020-08-26 DIAGNOSIS — E213 Hyperparathyroidism, unspecified: Secondary | ICD-10-CM | POA: Diagnosis not present

## 2020-08-26 DIAGNOSIS — R5383 Other fatigue: Secondary | ICD-10-CM

## 2020-08-26 DIAGNOSIS — R262 Difficulty in walking, not elsewhere classified: Secondary | ICD-10-CM | POA: Diagnosis not present

## 2020-08-26 DIAGNOSIS — M25561 Pain in right knee: Secondary | ICD-10-CM | POA: Diagnosis not present

## 2020-08-26 DIAGNOSIS — M8949 Other hypertrophic osteoarthropathy, multiple sites: Secondary | ICD-10-CM | POA: Diagnosis not present

## 2020-08-26 DIAGNOSIS — I10 Essential (primary) hypertension: Secondary | ICD-10-CM

## 2020-08-26 DIAGNOSIS — N184 Chronic kidney disease, stage 4 (severe): Secondary | ICD-10-CM | POA: Diagnosis not present

## 2020-08-26 DIAGNOSIS — G8929 Other chronic pain: Secondary | ICD-10-CM

## 2020-08-26 DIAGNOSIS — R7309 Other abnormal glucose: Secondary | ICD-10-CM | POA: Diagnosis not present

## 2020-08-26 DIAGNOSIS — M159 Polyosteoarthritis, unspecified: Secondary | ICD-10-CM

## 2020-08-26 DIAGNOSIS — R7303 Prediabetes: Secondary | ICD-10-CM | POA: Diagnosis not present

## 2020-08-26 DIAGNOSIS — M25562 Pain in left knee: Secondary | ICD-10-CM | POA: Diagnosis not present

## 2020-08-26 MED ORDER — DICLOFENAC SODIUM 1 % EX GEL: 2.0000 g | Freq: Four times a day (QID) | CUTANEOUS | 0 refills | Status: DC

## 2020-08-26 NOTE — Assessment & Plan Note (Signed)
Chronic.  BP well controlled in clinic today.   Will check labs and continue labetalol as prescribed by Nephrologist.  Continue to collaborate with Nephrology.  Follow up in 4 months.

## 2020-08-26 NOTE — Assessment & Plan Note (Addendum)
Acute.  Likely multifactorial.  Concern with 4 lb unintentional weight loss, depressed mood, trouble falling asleep, and feeling tired all of the time.  Differentials include depression, sleep apnea, worsening kidney function, polypharmacy with Tramadol, Tylenol#3, and lorazepam previously.  Discussed options with patient.  Patient does not desire counseling or therapy at this time.  Will plan to decrease Tramadol to 25 mg daily.  Start mirtazapine 7.5 mg nightly to help with appetite, mood, and sleep.  Blood work checked today. Follow up in 1 month.

## 2020-08-26 NOTE — Assessment & Plan Note (Signed)
Chronic.  Follows with Nephrology every 2 months.  Discussed maximizing blood pressure and blood sugar treatment to prevent progression.  Continue collaboration with Nephrology.

## 2020-08-26 NOTE — Progress Notes (Signed)
Subjective:    Patient ID: [name], female    DOB: 11-26-1930, 87 y.o.   MRN: [id number]  HPI: [name] is a 87 y.o. female presenting with daughter, Katharine Look, for follow up.  Chief Complaint  Patient presents with  . Hypertension  . Pain    Knee pain in both knees, asking for meds to help with the pain, did inj in the past for the pain. Declines replacement surgery  . sleep too much    Sleeping too much   FATIGUE Patient reports, she "just feels sleepy a lot."  Daughter reports she is in the process of moving back to [place] from [place] and she and the patient are living with the patient's son while remodeling the patient's home to be a better, more suitable living environment for her.   Daughter reports she is getting days and nights confused at times.   Patient reports she feels increased stress and is not as comfortable living at her son's house due to the stairs and because she cannot garden there. Duration:  months Severity: sleeping ~20 hours per day   Onset: gradual  Context when symptoms started:  nothing Symptoms improve with rest: sometimes  Depressive symptoms: yes Stress/anxiety: no Insomnia: yes hard to fall asleep Snoring: yes Observed apnea by bed partner: no bed partner Daytime hypersomnolence:yes Wakes feeling refreshed: no History of sleep study: no Dysnea on exertion:  no Orthopnea/PND: no Chest pain: no Chronic cough: no Lower extremity edema: yes; ankles - stable per patient Arthralgias: yes; bilateral joint pain Myalgias: no Weakness: no Rash: no  HYPERTENSION Patient is currently taking labetalol 100 mg twice daily for blood pressure.  She is checking her blood pressure at home but does not remember the readings and does not write them down. Hypertension status: controlled  Satisfied with current treatment? yes Duration of hypertension: chronic BP monitoring frequency:  daily BP medication side effects:  no Medication compliance:  excellent Aspirin: yes Recurrent headaches: no Visual changes: no Palpitations: no Dyspnea: no Chest pain: no Lower extremity edema: no Dizzy/lightheaded: no  CHRONIC KIDNEY DISEASE Follows with Nephrology - next visit is later this month.  CKD stage IV, recent GFR 15.  Nephrology note states family does not want aggressive treatment for chronic kidney disease.   CKD status: controlled Medications renally dose: yes Previous renal evaluation: yes Pneumovax:  Up to Date Influenza Vaccine:  Up to Date  KNEE PAIN Patient reports chronic knee pain.  Currently taking Tramadol 50 mg daily in the morning and was also taking Tylenol#3 in the evening before bed for pain before she ran out.  Has seen an Orthopedic provider in the past, was getting knee injections that were helpful.  Last visit was June 2021.  Previously tried Physical therapy with some benefit. Duration: chronic Involved knee: bilateral Mechanism of injury: osteoarthritis Location:diffuse Onset: gradual Severity: moderate  Quality:  sharp Frequency: constant Radiation: no Aggravating factors: weight bearing, walking, stairs and movement  Alleviating factors: rest, using cane, Tramadol, ice, physical therapy and APAP  Status: worse Treatments attempted: rest, using cane, PT in past, APAP, Tramadol  Relief with NSAIDs?:  No NSAIDs Taken Weakness with weight bearing or walking: no Sensation of giving way: no Locking: no Popping: no Bruising: no Swelling: yes Redness: no Paresthesias/decreased sensation: no Fevers: no  Allergies  Allergen Reactions  . Ciprofloxacin     Acute renal failure  . Diphenhydramine Hcl     REACTION: UNKNOWN REACTION  .  Sulfonamide Derivatives Rash    Outpatient Encounter Medications as of 08/26/2020  Medication Sig  . aspirin EC 81 MG tablet Take 81 mg by mouth daily.  Marland Kitchen atorvastatin (LIPITOR) 20 MG tablet TAKE 1 TABLET(20 MG) BY MOUTH DAILY  . diclofenac Sodium (VOLTAREN) 1 % GEL  Apply 2 g topically 4 (four) times daily.  Marland Kitchen dipyridamole-aspirin (AGGRENOX) 200-25 MG 12hr capsule TAKE ONE CAPSULE BY MOUTH TWICE DAILY, EVERY 12 HOURS  . fluticasone (FLONASE) 50 MCG/ACT nasal spray Place 2 sprays into both nostrils daily as needed for allergies or rhinitis.  Marland Kitchen iron polysaccharides (NIFEREX) 150 MG capsule Take 1 capsule (150 mg total) by mouth daily.  Marland Kitchen labetalol (NORMODYNE) 100 MG tablet TAKE 1 TABLET BY MOUTH TWICE DAILY  . mirtazapine (REMERON) 7.5 MG tablet Take 1 tablet (7.5 mg total) by mouth at bedtime.  . Omega-3 Fatty Acids (OMEGA-3 FISH OIL PO) Take by mouth.  Marland Kitchen omeprazole (PRILOSEC) 20 MG capsule TAKE 1 CAPSULE(20 MG) BY MOUTH DAILY  . sodium bicarbonate 650 MG tablet TAKE 2 TABLETS(1300 MG) BY MOUTH THREE TIMES DAILY  . traMADol (ULTRAM) 50 MG tablet TAKE 1 TABLET(50 MG) BY MOUTH EVERY 6 HOURS AS NEEDED  . furosemide (LASIX) 20 MG tablet Take 20 mg by mouth 2 (two) times daily.  Marland Kitchen triamcinolone ointment (KENALOG) 0.1 % Apply to mouth sore twice a day as needed for 1 week  . [DISCONTINUED] acetaminophen-codeine (TYLENOL #3) 300-30 MG tablet TAKE 1 TO 2 TABLETS BY MOUTH DAILY FOR UP TO 7 DAYS AS NEEDED FOR MODERATE PAIN  . [DISCONTINUED] acetaminophen-codeine (TYLENOL #3) 300-30 MG tablet TAKE 1 TABLET BY MOUTH EVERY 6 HOURS AS NEEDED FOR MODERATE PAIN  . [DISCONTINUED] albuterol (PROVENTIL HFA;VENTOLIN HFA) 108 (90 BASE) MCG/ACT inhaler Inhale 2 puffs into the lungs every 6 (six) hours as needed (wheezing/cough).  . [DISCONTINUED] cetirizine (ZYRTEC) 10 MG tablet Take 10 mg by mouth daily.  . [DISCONTINUED] diclofenac sodium (VOLTAREN) 1 % GEL Apply dime size to bilateral knees three times a day as needed  . [DISCONTINUED] LORazepam (ATIVAN) 1 MG tablet Take 1 tablet (1 mg total) by mouth at bedtime.  . [DISCONTINUED] meclizine (ANTIVERT) 12.5 MG tablet Take 1 tablet (12.5 mg total) by mouth 3 (three) times daily as needed for dizziness.  . [DISCONTINUED]  polyethylene glycol powder (GLYCOLAX/MIRALAX) 17 GM/SCOOP powder Take 17 g by mouth daily.   No facility-administered encounter medications on file as of 08/26/2020.    Patient Active Problem List   Diagnosis Date Noted  . Prediabetes 08/29/2020  . Fatigue 08/26/2020  . Protein-calorie malnutrition ([place]) 04/19/2020  . GERD (gastroesophageal reflux disease) 08/12/2019  . Bilateral primary osteoarthritis of knee 11/09/2016  . History of stroke 06/02/2016  . Osteoarthritis 03/25/2014  . Anxiety and depression 02/20/2012  . Chronic headache 02/20/2012  . Peripheral edema 02/19/2012  . Hyperparathyroidism ([place]) 10/23/2008  . ANEMIA 10/23/2008  . Essential hypertension 10/23/2008  . GALLSTONE PANCREATITIS 10/23/2008  . Chronic kidney disease 10/23/2008  . DEGENERATIVE JOINT DISEASE 10/23/2008  . PROTEINURIA 10/23/2008    Past Medical History:  Diagnosis Date  . Anxiety   . Arthritis   . CKD (chronic kidney disease)    Dr. Hinda Lenis  . Depression   . GERD (gastroesophageal reflux disease)   . Hyperlipidemia   . Hypertension   . Osteoporosis   . Stroke ([place])   . UTI (lower urinary tract infection)     Relevant past medical, surgical, family and social history reviewed and updated  as indicated. Interim medical history since our last visit reviewed.  Review of Systems Per HPI unless specifically indicated above     Objective:    BP 126/60   Pulse 65   Temp 97.9 F (36.6 C)   Ht 4\' 9"  (1.448 m)   Wt 142 lb 9.6 oz (64.7 kg)   SpO2 95%   BMI 30.86 kg/m   Wt Readings from Last 3 Encounters:  08/26/20 142 lb 9.6 oz (64.7 kg)  04/19/20 146 lb (66.2 kg)  01/12/20 146 lb (66.2 kg)    Physical Exam Vitals and nursing note reviewed.  Constitutional:      General: She is not in acute distress.    Appearance: Normal appearance. She is not toxic-appearing.  HENT:     Head: Normocephalic and atraumatic.     Right Ear: External ear normal.     Left Ear: External ear normal.   Eyes:     General: No scleral icterus.       Right eye: No discharge.        Left eye: No discharge.     Extraocular Movements: Extraocular movements intact.     Pupils: Pupils are equal, round, and reactive to light.  Cardiovascular:     Rate and Rhythm: Normal rate and regular rhythm.     Heart sounds: Normal heart sounds. No murmur heard.   Pulmonary:     Effort: Pulmonary effort is normal. No respiratory distress.     Breath sounds: Normal breath sounds. No wheezing, rhonchi or rales.  Abdominal:     General: Abdomen is flat. Bowel sounds are normal. There is no distension.     Palpations: Abdomen is soft.     Tenderness: There is no right CVA tenderness or left CVA tenderness.  Musculoskeletal:     Cervical back: Normal range of motion.     Right lower leg: No edema.     Left lower leg: No edema.  Lymphadenopathy:     Cervical: No cervical adenopathy.  Skin:    General: Skin is warm and dry.     Capillary Refill: Capillary refill takes less than 2 seconds.     Coloration: Skin is not jaundiced or pale.     Findings: No erythema.  Neurological:     Mental Status: She is alert and oriented to person, place, and time.     Motor: No weakness.     Gait: Gait abnormal (walks with cane).  Psychiatric:        Mood and Affect: Mood normal.        Behavior: Behavior normal.        Thought Content: Thought content normal.        Judgment: Judgment normal.        Assessment & Plan:   Problem List Items Addressed This Visit      Cardiovascular and Mediastinum   Essential hypertension    Chronic.  BP well controlled in clinic today.   Will check labs and continue labetalol as prescribed by Nephrologist.  Continue to collaborate with Nephrology.  Follow up in 4 months.      Relevant Medications   furosemide (LASIX) 20 MG tablet   Other Relevant Orders   COMPLETE METABOLIC PANEL WITH GFR (Completed)   Lipid panel (Completed)   TSH (Completed)   CBC with  Differential/Platelet (Completed)     Endocrine   Hyperparathyroidism ([place])    Follows closely with Nephrology - continue collaboration and continue sodium bicarbonate.  Musculoskeletal and Integument   Osteoarthritis    Chronic to bilateral knees.  Previous evaluation with orthopedic provider and received knee injections.  Encouraged continued follow up with Orthopedic providers to discuss treatment options.  With previous benefit from PT, will place referral today.  Given worsening fatigue, worry about polypharmacy with Tramadol.  Will plan to decrease Tramadol 50 mg to 1/2 tablet or 25 mg.  Refill not needed.  Controlled substance agreement signed.  Can also use Tylenol for breakthrough pain or topical treatment with Voltaren gel.        Relevant Medications   diclofenac Sodium (VOLTAREN) 1 % GEL     Genitourinary   Chronic kidney disease - Primary    Chronic.  Follows with Nephrology every 2 months.  Discussed maximizing blood pressure and blood sugar treatment to prevent progression.  Continue collaboration with Nephrology.        Other   Prediabetes    HgbA1c checked today.  Given age and co morbidities, goal less than 8.5%.        Relevant Orders   COMPLETE METABOLIC PANEL WITH GFR (Completed)   Lipid panel (Completed)   TSH (Completed)   CBC with Differential/Platelet (Completed)   Hemoglobin A1c (Completed)   Fatigue    Acute.  Likely multifactorial.  Concern with 4 lb unintentional weight loss, depressed mood, trouble falling asleep, and feeling tired all of the time.  Differentials include depression, sleep apnea, worsening kidney function, polypharmacy with Tramadol, Tylenol#3, and lorazepam previously.  Discussed options with patient.  Patient does not desire counseling or therapy at this time.  Will plan to decrease Tramadol to 25 mg daily.  Start mirtazapine 7.5 mg nightly to help with appetite, mood, and sleep.  Blood work checked today. Follow up in 1 month.       Relevant Medications   mirtazapine (REMERON) 7.5 MG tablet    Other Visit Diagnoses    Chronic pain of left knee       Relevant Medications   mirtazapine (REMERON) 7.5 MG tablet   Other Relevant Orders   Ambulatory referral to Physical Therapy   Chronic pain of right knee       Relevant Orders   Ambulatory referral to Physical Therapy   Difficulty walking       Relevant Orders   Ambulatory referral to Physical Therapy       Follow up plan: Return in about 3 months (around 11/26/2020).

## 2020-08-26 NOTE — Assessment & Plan Note (Addendum)
Chronic to bilateral knees.  Previous evaluation with orthopedic provider and received knee injections.  Encouraged continued follow up with Orthopedic providers to discuss treatment options.  With previous benefit from PT, will place referral today.  Given worsening fatigue, worry about polypharmacy with Tramadol.  Will plan to decrease Tramadol 50 mg to 1/2 tablet or 25 mg.  Refill not needed.  Controlled substance agreement signed.  Can also use Tylenol for breakthrough pain or topical treatment with Voltaren gel.

## 2020-08-27 LAB — COMPLETE METABOLIC PANEL WITH GFR
AG Ratio: 1.2 (calc) (ref 1.0–2.5)
ALT: 13 U/L (ref 6–29)
AST: 18 U/L (ref 10–35)
Albumin: 3.9 g/dL (ref 3.6–5.1)
Alkaline phosphatase (APISO): 66 U/L (ref 37–153)
BUN/Creatinine Ratio: 18 (calc) (ref 6–22)
BUN: 53 mg/dL — ABNORMAL HIGH (ref 7–25)
CO2: 27 mmol/L (ref 20–32)
Calcium: 9.4 mg/dL (ref 8.6–10.4)
Chloride: 104 mmol/L (ref 98–110)
Creat: 2.91 mg/dL — ABNORMAL HIGH (ref 0.60–0.88)
GFR, Est African American: 16 mL/min/{1.73_m2} — ABNORMAL LOW (ref >=60)
GFR, Est Non African American: 14 mL/min/{1.73_m2} — ABNORMAL LOW (ref >=60)
Globulin: 3.2 g/dL (calc) (ref 1.9–3.7)
Glucose, Bld: 94 mg/dL (ref 65–99)
Potassium: 4.6 mmol/L (ref 3.5–5.3)
Sodium: 143 mmol/L (ref 135–146)
Total Bilirubin: 0.7 mg/dL (ref 0.2–1.2)
Total Protein: 7.1 g/dL (ref 6.1–8.1)

## 2020-08-27 LAB — TSH: TSH: 1.67 mIU/L (ref 0.40–4.50)

## 2020-08-27 LAB — LIPID PANEL
Cholesterol: 158 mg/dL (ref <200)
HDL: 47 mg/dL — ABNORMAL LOW (ref >=50)
LDL Cholesterol (Calc): 88 mg/dL (calc)
Non-HDL Cholesterol (Calc): 111 mg/dL (calc) (ref <130)
Total CHOL/HDL Ratio: 3.4 (calc) (ref <5.0)
Triglycerides: 125 mg/dL (ref <150)

## 2020-08-27 LAB — CBC WITH DIFFERENTIAL/PLATELET
Absolute Monocytes: 782 cells/uL (ref 200–950)
Basophils Absolute: 48 cells/uL (ref 0–200)
Basophils Relative: 0.7 %
Eosinophils Absolute: 177 cells/uL (ref 15–500)
Eosinophils Relative: 2.6 %
HCT: 37.3 % (ref 35.0–45.0)
Hemoglobin: 11.8 g/dL (ref 11.7–15.5)
Lymphs Abs: 1999 cells/uL (ref 850–3900)
MCH: 31.1 pg (ref 27.0–33.0)
MCHC: 31.6 g/dL — ABNORMAL LOW (ref 32.0–36.0)
MCV: 98.2 fL (ref 80.0–100.0)
MPV: 11.4 fL (ref 7.5–12.5)
Monocytes Relative: 11.5 %
Neutro Abs: 3794 cells/uL (ref 1500–7800)
Neutrophils Relative %: 55.8 %
Platelets: 202 10*3/uL (ref 140–400)
RBC: 3.8 10*6/uL (ref 3.80–5.10)
RDW: 12.8 % (ref 11.0–15.0)
Total Lymphocyte: 29.4 %
WBC: 6.8 10*3/uL (ref 3.8–10.8)

## 2020-08-27 LAB — HEMOGLOBIN A1C
Hgb A1c MFr Bld: 6.1 % of total Hgb — ABNORMAL HIGH (ref <5.7)
Mean Plasma Glucose: 128 mg/dL
eAG (mmol/L): 7.1 mmol/L

## 2020-08-29 DIAGNOSIS — R7303 Prediabetes: Secondary | ICD-10-CM | POA: Insufficient documentation

## 2020-08-29 MED ORDER — MIRTAZAPINE 7.5 MG PO TABS
7.5000 mg | ORAL_TABLET | Freq: Every day | ORAL | 1 refills | Status: DC
Start: 2020-08-29 — End: 2021-04-08

## 2020-08-29 NOTE — Assessment & Plan Note (Signed)
Follows closely with Nephrology - continue collaboration and continue sodium bicarbonate.

## 2020-08-29 NOTE — Assessment & Plan Note (Signed)
HgbA1c checked today.  Given age and co morbidities, goal less than 8.5%.

## 2020-08-30 ENCOUNTER — Telehealth: Payer: Self-pay

## 2020-08-30 ENCOUNTER — Encounter: Payer: Self-pay | Admitting: *Deleted

## 2020-08-30 NOTE — Telephone Encounter (Signed)
Six, Eden Lathe, LPN  Precious Bard, RMA Hi!   Can you please contact patient daughter to F/U with Dr Erlinda Hong?   Thank you!

## 2020-08-30 NOTE — Telephone Encounter (Signed)
Called to make appt. No answer LMOM.

## 2020-08-31 ENCOUNTER — Telehealth: Payer: Self-pay | Admitting: Nurse Practitioner

## 2020-08-31 NOTE — Telephone Encounter (Signed)
Left message on patient's vmail to request call back; need to schedule 1 month fatigue f/u with provider as per provider's request.

## 2020-08-31 NOTE — Progress Notes (Signed)
Left message on voicemail to request call back.

## 2020-09-01 NOTE — Telephone Encounter (Signed)
Called to make an appt. No answer.

## 2020-09-01 NOTE — Telephone Encounter (Signed)
Called another number on file- which is her daughter's number. She state her moms' home number is not a valid number anymore. For now she said to call her [phone].

## 2020-09-07 ENCOUNTER — Telehealth: Payer: Self-pay | Admitting: Nurse Practitioner

## 2020-09-07 NOTE — Telephone Encounter (Signed)
Pt's daughter called asking to get a refill of this med for pt dipyridamole-aspirin (AGGRENOX) 200-25 MG  iron polysaccharides (NIFEREX) 150 MG capsule    Sent to pharmacy. Pt is out of these med and would like to refill before heading out of town. Please call   Cb#:[phone]

## 2020-09-08 ENCOUNTER — Ambulatory Visit: Payer: Medicare HMO | Admitting: Orthopaedic Surgery

## 2020-09-08 MED ORDER — POLYSACCHARIDE IRON COMPLEX 150 MG PO CAPS: 150.0000 mg | ORAL_CAPSULE | Freq: Every day | ORAL | 3 refills | Status: DC

## 2020-09-08 MED ORDER — ASPIRIN-DIPYRIDAMOLE ER 25-200 MG PO CP12: 1.0000 | ORAL_CAPSULE | Freq: Two times a day (BID) | ORAL | 3 refills | Status: DC

## 2020-09-08 NOTE — Telephone Encounter (Signed)
Prescription sent to pharmacy.

## 2020-09-10 ENCOUNTER — Other Ambulatory Visit: Payer: Self-pay | Admitting: Family Medicine

## 2020-09-13 ENCOUNTER — Other Ambulatory Visit: Payer: Self-pay

## 2020-09-13 MED ORDER — OMEPRAZOLE 20 MG PO CPDR: DELAYED_RELEASE_CAPSULE | ORAL | 2 refills | Status: DC

## 2020-10-08 ENCOUNTER — Telehealth: Payer: Self-pay | Admitting: Nurse Practitioner

## 2020-10-08 MED ORDER — LABETALOL HCL 100 MG PO TABS: ORAL_TABLET | ORAL | 0 refills | Status: DC

## 2020-10-08 NOTE — Telephone Encounter (Signed)
Prescription sent to pharmacy.

## 2020-10-08 NOTE — Telephone Encounter (Signed)
Pt's daughter called in requesting a refill of labetalol (NORMODYNE) 100 MG tablet . Please call   Cb#: [phone]

## 2020-11-11 DIAGNOSIS — M25562 Pain in left knee: Secondary | ICD-10-CM | POA: Diagnosis not present

## 2020-11-11 DIAGNOSIS — M1712 Unilateral primary osteoarthritis, left knee: Secondary | ICD-10-CM | POA: Diagnosis not present

## 2020-11-11 DIAGNOSIS — M25561 Pain in right knee: Secondary | ICD-10-CM | POA: Diagnosis not present

## 2020-11-11 DIAGNOSIS — M170 Bilateral primary osteoarthritis of knee: Secondary | ICD-10-CM | POA: Diagnosis not present

## 2020-11-18 DIAGNOSIS — M1712 Unilateral primary osteoarthritis, left knee: Secondary | ICD-10-CM | POA: Diagnosis not present

## 2020-11-18 DIAGNOSIS — M25562 Pain in left knee: Secondary | ICD-10-CM | POA: Diagnosis not present

## 2020-11-27 ENCOUNTER — Other Ambulatory Visit: Payer: Self-pay | Admitting: Nurse Practitioner

## 2020-11-30 ENCOUNTER — Other Ambulatory Visit: Payer: Self-pay | Admitting: Nurse Practitioner

## 2020-11-30 MED ORDER — ATORVASTATIN CALCIUM 20 MG PO TABS: 20.0000 mg | ORAL_TABLET | Freq: Every day | ORAL | 0 refills | Status: DC

## 2020-11-30 NOTE — Telephone Encounter (Signed)
Ok to refill Tramadol??  Last office visit 08/26/2020.  Last refill 06/15/2020.

## 2020-11-30 NOTE — Telephone Encounter (Signed)
90 day supply for Tramadol 50 mg and atorvastatin 20 mg [name]  CB# [phone]

## 2020-12-02 ENCOUNTER — Telehealth: Payer: Self-pay | Admitting: Nurse Practitioner

## 2020-12-02 DIAGNOSIS — M25562 Pain in left knee: Secondary | ICD-10-CM | POA: Diagnosis not present

## 2020-12-02 DIAGNOSIS — M1712 Unilateral primary osteoarthritis, left knee: Secondary | ICD-10-CM | POA: Diagnosis not present

## 2020-12-02 NOTE — Telephone Encounter (Signed)
Spoke with Mr. [name] and told her that the tramadol wasn't going to be called in until patient had an office visit. He said that the patient is in [place] and needs the tramadol for her knees. He requested that we reconsider calling in the medication. I advised I didn't think that was going to happen until patient was seen in office. They asked me what other options did they have I suggested an UC in [place] since patient isn't here and if she needed the tramadol.  CB# [phone]

## 2020-12-03 ENCOUNTER — Ambulatory Visit: Payer: Medicare HMO | Admitting: Nurse Practitioner

## 2020-12-03 NOTE — Telephone Encounter (Signed)
Yes, unfortunately, she will need OV in person prior to further refills of Tramadol.

## 2020-12-09 DIAGNOSIS — R262 Difficulty in walking, not elsewhere classified: Secondary | ICD-10-CM | POA: Diagnosis not present

## 2020-12-09 DIAGNOSIS — M1712 Unilateral primary osteoarthritis, left knee: Secondary | ICD-10-CM | POA: Diagnosis not present

## 2020-12-09 DIAGNOSIS — M25562 Pain in left knee: Secondary | ICD-10-CM | POA: Diagnosis not present

## 2020-12-16 DIAGNOSIS — M25562 Pain in left knee: Secondary | ICD-10-CM | POA: Diagnosis not present

## 2020-12-16 DIAGNOSIS — R262 Difficulty in walking, not elsewhere classified: Secondary | ICD-10-CM | POA: Diagnosis not present

## 2020-12-16 DIAGNOSIS — M1712 Unilateral primary osteoarthritis, left knee: Secondary | ICD-10-CM | POA: Diagnosis not present

## 2020-12-18 ENCOUNTER — Other Ambulatory Visit: Payer: Self-pay | Admitting: Family Medicine

## 2020-12-28 DIAGNOSIS — H25013 Cortical age-related cataract, bilateral: Secondary | ICD-10-CM | POA: Diagnosis not present

## 2020-12-28 DIAGNOSIS — H524 Presbyopia: Secondary | ICD-10-CM | POA: Diagnosis not present

## 2020-12-28 DIAGNOSIS — H5200 Hypermetropia, unspecified eye: Secondary | ICD-10-CM | POA: Diagnosis not present

## 2020-12-30 DIAGNOSIS — M25561 Pain in right knee: Secondary | ICD-10-CM | POA: Diagnosis not present

## 2020-12-30 DIAGNOSIS — R262 Difficulty in walking, not elsewhere classified: Secondary | ICD-10-CM | POA: Diagnosis not present

## 2020-12-30 DIAGNOSIS — M1711 Unilateral primary osteoarthritis, right knee: Secondary | ICD-10-CM | POA: Diagnosis not present

## 2021-01-06 DIAGNOSIS — R262 Difficulty in walking, not elsewhere classified: Secondary | ICD-10-CM | POA: Diagnosis not present

## 2021-01-06 DIAGNOSIS — M25561 Pain in right knee: Secondary | ICD-10-CM | POA: Diagnosis not present

## 2021-01-06 DIAGNOSIS — M1711 Unilateral primary osteoarthritis, right knee: Secondary | ICD-10-CM | POA: Diagnosis not present

## 2021-01-13 DIAGNOSIS — M1711 Unilateral primary osteoarthritis, right knee: Secondary | ICD-10-CM | POA: Diagnosis not present

## 2021-01-13 DIAGNOSIS — R262 Difficulty in walking, not elsewhere classified: Secondary | ICD-10-CM | POA: Diagnosis not present

## 2021-01-13 DIAGNOSIS — M25561 Pain in right knee: Secondary | ICD-10-CM | POA: Diagnosis not present

## 2021-01-20 DIAGNOSIS — R262 Difficulty in walking, not elsewhere classified: Secondary | ICD-10-CM | POA: Diagnosis not present

## 2021-01-20 DIAGNOSIS — M1711 Unilateral primary osteoarthritis, right knee: Secondary | ICD-10-CM | POA: Diagnosis not present

## 2021-01-20 DIAGNOSIS — M25561 Pain in right knee: Secondary | ICD-10-CM | POA: Diagnosis not present

## 2021-01-27 DIAGNOSIS — M1712 Unilateral primary osteoarthritis, left knee: Secondary | ICD-10-CM | POA: Diagnosis not present

## 2021-01-27 DIAGNOSIS — R262 Difficulty in walking, not elsewhere classified: Secondary | ICD-10-CM | POA: Diagnosis not present

## 2021-01-27 DIAGNOSIS — M170 Bilateral primary osteoarthritis of knee: Secondary | ICD-10-CM | POA: Diagnosis not present

## 2021-01-27 DIAGNOSIS — M25561 Pain in right knee: Secondary | ICD-10-CM | POA: Diagnosis not present

## 2021-01-27 DIAGNOSIS — M25562 Pain in left knee: Secondary | ICD-10-CM | POA: Diagnosis not present

## 2021-01-27 DIAGNOSIS — M1711 Unilateral primary osteoarthritis, right knee: Secondary | ICD-10-CM | POA: Diagnosis not present

## 2021-02-11 ENCOUNTER — Other Ambulatory Visit: Payer: Self-pay | Admitting: Nurse Practitioner

## 2021-02-14 ENCOUNTER — Ambulatory Visit: Payer: Medicare HMO | Admitting: Nurse Practitioner

## 2021-02-14 DIAGNOSIS — E211 Secondary hyperparathyroidism, not elsewhere classified: Secondary | ICD-10-CM | POA: Diagnosis not present

## 2021-02-14 DIAGNOSIS — I129 Hypertensive chronic kidney disease with stage 1 through stage 4 chronic kidney disease, or unspecified chronic kidney disease: Secondary | ICD-10-CM | POA: Diagnosis not present

## 2021-02-14 DIAGNOSIS — N184 Chronic kidney disease, stage 4 (severe): Secondary | ICD-10-CM | POA: Diagnosis not present

## 2021-02-14 DIAGNOSIS — R600 Localized edema: Secondary | ICD-10-CM | POA: Diagnosis not present

## 2021-02-16 ENCOUNTER — Other Ambulatory Visit: Payer: Self-pay

## 2021-02-16 ENCOUNTER — Encounter: Payer: Self-pay | Admitting: Nurse Practitioner

## 2021-02-16 ENCOUNTER — Ambulatory Visit (INDEPENDENT_AMBULATORY_CARE_PROVIDER_SITE_OTHER): Payer: Medicare HMO | Admitting: Nurse Practitioner

## 2021-02-16 VITALS — BP 122/78 | HR 63 | Temp 97.2°F | Ht 62.0 in | Wt 146.2 lb

## 2021-02-16 DIAGNOSIS — M159 Polyosteoarthritis, unspecified: Secondary | ICD-10-CM

## 2021-02-16 DIAGNOSIS — N184 Chronic kidney disease, stage 4 (severe): Secondary | ICD-10-CM | POA: Diagnosis not present

## 2021-02-16 DIAGNOSIS — E213 Hyperparathyroidism, unspecified: Secondary | ICD-10-CM

## 2021-02-16 DIAGNOSIS — Z23 Encounter for immunization: Secondary | ICD-10-CM | POA: Diagnosis not present

## 2021-02-16 DIAGNOSIS — D631 Anemia in chronic kidney disease: Secondary | ICD-10-CM

## 2021-02-16 DIAGNOSIS — I10 Essential (primary) hypertension: Secondary | ICD-10-CM

## 2021-02-16 DIAGNOSIS — R5383 Other fatigue: Secondary | ICD-10-CM

## 2021-02-16 MED ORDER — TRAMADOL HCL 50 MG PO TABS: 50.0000 mg | ORAL_TABLET | Freq: Every day | ORAL | 0 refills | Status: DC | PRN

## 2021-02-16 NOTE — Progress Notes (Signed)
Subjective:    Patient ID: [name], female    DOB: 08/07/1930, 87 y.o.   MRN: [id number]  HPI: [name] is a 87 y.o. female presenting with daughter for follow up.  Chief Complaint  Patient presents with   Follow-up    Follow up and labs   HYPERTENSION/HYPERLIPIDEMIA Patient is currently taking labetalol 100 mg twice daily.  She follows closely with nephrology for chronic kidney disease.  Also taking atorvastatin 20 mg daily for cholesterol.  Tolerating these medications well. Hypertension status: controlled  BP monitoring frequency:  not checking Aspirin: yes Recurrent headaches: no Visual changes: no Palpitations: no Dyspnea: no Chest pain: no Lower extremity edema: no Dizzy/lightheaded: no LDL goal: less than 100  She is currently taking tramadol 50 mg daily as needed for pain.  She does not take this every day.  She does have significant pain in her knees.  Her daughter took her to her provider in [place] and she received injections.  She reports her pain is somewhat improved since this time.  She is taking and tolerating mirtazapine 7.5 mg daily at bedtime well.  We have been keeping an eye on her weight.  She reports she is trying to eat enough food, however does complain that it does not have enough salt on it.  CHRONIC KIDNEY DISEASE Patient follows with nephrology every 2 months.  Recently had blood work done-this was reviewed today with the patient and her daughter. CKD status: stable Medications renally dose: yes Previous renal evaluation: yes Pneumovax:  Not up to Date; I recommended, however patient declined Influenza Vaccine:  Up to Date   Allergies  Allergen Reactions   Ciprofloxacin     Acute renal failure   Diphenhydramine Hcl     REACTION: UNKNOWN REACTION   Sulfonamide Derivatives Rash    Outpatient Encounter Medications as of 02/16/2021  Medication Sig   aspirin EC 81 MG tablet Take 81 mg by mouth daily.   atorvastatin  (LIPITOR) 20 MG tablet TAKE 1 TABLET(20 MG) BY MOUTH DAILY   diclofenac Sodium (VOLTAREN) 1 % GEL Apply 2 g topically 4 (four) times daily.   dipyridamole-aspirin (AGGRENOX) 200-25 MG 12hr capsule Take 1 capsule by mouth 2 (two) times daily.   fluticasone (FLONASE) 50 MCG/ACT nasal spray Place 2 sprays into both nostrils daily as needed for allergies or rhinitis.   furosemide (LASIX) 20 MG tablet Take 20 mg by mouth 2 (two) times daily.   iron polysaccharides (NIFEREX) 150 MG capsule Take 1 capsule (150 mg total) by mouth daily.   labetalol (NORMODYNE) 100 MG tablet TAKE 1 TABLET BY MOUTH TWICE DAILY   mirtazapine (REMERON) 7.5 MG tablet Take 1 tablet (7.5 mg total) by mouth at bedtime.   Omega-3 Fatty Acids (OMEGA-3 FISH OIL PO) Take by mouth.   omeprazole (PRILOSEC) 20 MG capsule TAKE 1 CAPSULE(20 MG) BY MOUTH DAILY   sodium bicarbonate 650 MG tablet TAKE 2 TABLETS(1300 MG) BY MOUTH THREE TIMES DAILY   triamcinolone ointment (KENALOG) 0.1 % Apply to mouth sore twice a day as needed for 1 week   [DISCONTINUED] traMADol (ULTRAM) 50 MG tablet TAKE 1 TABLET(50 MG) BY MOUTH EVERY 6 HOURS AS NEEDED   traMADol (ULTRAM) 50 MG tablet Take 1 tablet (50 mg total) by mouth daily as needed.   No facility-administered encounter medications on file as of 02/16/2021.    Patient Active Problem List   Diagnosis Date Noted   Prediabetes 08/29/2020  Fatigue 08/26/2020   Protein-calorie malnutrition ([place]) 04/19/2020   GERD (gastroesophageal reflux disease) 08/12/2019   Bilateral primary osteoarthritis of knee 11/09/2016   History of stroke 06/02/2016   Osteoarthritis 03/25/2014   Anxiety and depression 02/20/2012   Chronic headache 02/20/2012   Peripheral edema 02/19/2012   Hyperparathyroidism ([place]) 10/23/2008   ANEMIA 10/23/2008   Essential hypertension 10/23/2008   GALLSTONE PANCREATITIS 10/23/2008   Chronic kidney disease 10/23/2008   DEGENERATIVE JOINT DISEASE 10/23/2008   PROTEINURIA  10/23/2008    Past Medical History:  Diagnosis Date   Anxiety    Arthritis    CKD (chronic kidney disease)    Dr. Hinda Lenis   Depression    GERD (gastroesophageal reflux disease)    Hyperlipidemia    Hypertension    Osteoporosis    Stroke [facility])    UTI (lower urinary tract infection)     Relevant past medical, surgical, family and social history reviewed and updated as indicated. Interim medical history since our last visit reviewed.  Review of Systems Per HPI unless specifically indicated above     Objective:    BP 122/78   Pulse 63   Temp (!) 97.2 F (36.2 C)   Ht 5\' 2"  (1.575 m)   Wt 146 lb 3.2 oz (66.3 kg)   SpO2 97%   BMI 26.74 kg/m   Wt Readings from Last 3 Encounters:  02/16/21 146 lb 3.2 oz (66.3 kg)  08/26/20 142 lb 9.6 oz (64.7 kg)  04/19/20 146 lb (66.2 kg)    Physical Exam Vitals and nursing note reviewed.  Constitutional:      General: She is not in acute distress.    Appearance: Normal appearance. She is not toxic-appearing.  HENT:     Head: Normocephalic and atraumatic.     Right Ear: External ear normal.     Left Ear: External ear normal.  Eyes:     General: No scleral icterus.       Right eye: No discharge.        Left eye: No discharge.     Extraocular Movements: Extraocular movements intact.  Cardiovascular:     Rate and Rhythm: Normal rate and regular rhythm.     Heart sounds: Normal heart sounds. No murmur heard. Pulmonary:     Effort: Pulmonary effort is normal. No respiratory distress.     Breath sounds: Normal breath sounds. No wheezing, rhonchi or rales.  Musculoskeletal:     Right lower leg: No edema.     Left lower leg: No edema.  Skin:    General: Skin is warm and dry.     Capillary Refill: Capillary refill takes less than 2 seconds.     Coloration: Skin is not jaundiced or pale.     Findings: No erythema.  Neurological:     Mental Status: She is alert and oriented to person, place, and time.     Motor: No weakness.      Gait: Gait abnormal (walks with cane).  Psychiatric:        Mood and Affect: Mood normal.        Behavior: Behavior normal.        Thought Content: Thought content normal.        Judgment: Judgment normal.      Assessment & Plan:   Problem List Items Addressed This Visit       Cardiovascular and Mediastinum   Essential hypertension - Primary    Chronic.  BP well controlled today in  office.  Reviewed recent labs and follow up with nephrology upcoming.  Continue collaboration and current medications.  Follow up in 3 months.         Endocrine   Hyperparathyroidism [facility])    Continue collaboration with specialists.         Musculoskeletal and Integument   Osteoarthritis    Chronic.  Bilateral knees.  Continue collaboration with orthopedist for knee injections.  Will refill Tramadol today - discussed limiting use of this medication to only for severe pain.  Controlled substance agreement has been signed in the past.  PDMP reviewed - will refill today.  She has been out of this medication for some time.  UDS next visit.  Follow up 3 months.       Relevant Medications   traMADol (ULTRAM) 50 MG tablet     Genitourinary   Chronic kidney disease    Chronic.  Continue collaboration with nephrology.  Continue pushing fluids with water.  Continue dietary changes.      Other Visit Diagnoses     Need for immunization against influenza       Relevant Orders   Flu Vaccine QUAD High Dose(Fluad) (Completed)        Follow up plan: Return in about 3 months (around 05/19/2021) for follow up.

## 2021-02-21 ENCOUNTER — Other Ambulatory Visit: Payer: Self-pay

## 2021-02-21 ENCOUNTER — Telehealth: Payer: Self-pay

## 2021-02-21 DIAGNOSIS — M159 Polyosteoarthritis, unspecified: Secondary | ICD-10-CM

## 2021-02-21 NOTE — Telephone Encounter (Signed)
Refill was sent in last week - can we call pharmacy and check if they have it?

## 2021-02-21 NOTE — Telephone Encounter (Signed)
Patient requesting a refill on tramadol 50mg 

## 2021-02-22 DIAGNOSIS — D631 Anemia in chronic kidney disease: Secondary | ICD-10-CM | POA: Diagnosis not present

## 2021-02-22 DIAGNOSIS — N184 Chronic kidney disease, stage 4 (severe): Secondary | ICD-10-CM | POA: Diagnosis not present

## 2021-02-22 DIAGNOSIS — R600 Localized edema: Secondary | ICD-10-CM | POA: Diagnosis not present

## 2021-02-22 DIAGNOSIS — N189 Chronic kidney disease, unspecified: Secondary | ICD-10-CM | POA: Diagnosis not present

## 2021-02-22 DIAGNOSIS — I129 Hypertensive chronic kidney disease with stage 1 through stage 4 chronic kidney disease, or unspecified chronic kidney disease: Secondary | ICD-10-CM | POA: Diagnosis not present

## 2021-02-22 NOTE — Assessment & Plan Note (Signed)
Chronic.  Continue collaboration with nephrology.  Continue pushing fluids with water.  Continue dietary changes.

## 2021-02-22 NOTE — Assessment & Plan Note (Signed)
Chronic.  BP well controlled today in office.  Reviewed recent labs and follow up with nephrology upcoming.  Continue collaboration and current medications.  Follow up in 3 months.

## 2021-02-22 NOTE — Assessment & Plan Note (Signed)
Chronic.  Bilateral knees.  Continue collaboration with orthopedist for knee injections.  Will refill Tramadol today - discussed limiting use of this medication to only for severe pain.  Controlled substance agreement has been signed in the past.  PDMP reviewed - will refill today.  She has been out of this medication for some time.  UDS next visit.  Follow up 3 months.

## 2021-02-22 NOTE — Assessment & Plan Note (Signed)
Continue collaboration with specialists.

## 2021-02-23 NOTE — Telephone Encounter (Signed)
Please call and see if patient is aware of this and if she has picked up the prescription.

## 2021-02-24 NOTE — Telephone Encounter (Signed)
Per [facility] they did receive the Tramadol rx. Per pharmacy pt has NOT picked up this rx.  Called and spoke with pt's daughter and advised. Nothing further needed.

## 2021-02-28 ENCOUNTER — Other Ambulatory Visit: Payer: Self-pay | Admitting: Family Medicine

## 2021-04-08 ENCOUNTER — Encounter: Payer: Self-pay | Admitting: Nurse Practitioner

## 2021-04-08 ENCOUNTER — Other Ambulatory Visit: Payer: Self-pay

## 2021-04-08 ENCOUNTER — Ambulatory Visit
Admission: RE | Admit: 2021-04-08 | Discharge: 2021-04-08 | Disposition: A | Discharge status: Home / Self Care | Payer: Medicare HMO | Source: Ambulatory Visit | Attending: Nurse Practitioner | Admitting: Nurse Practitioner

## 2021-04-08 ENCOUNTER — Ambulatory Visit (INDEPENDENT_AMBULATORY_CARE_PROVIDER_SITE_OTHER): Payer: Medicare HMO | Admitting: Nurse Practitioner

## 2021-04-08 VITALS — BP 120/80 | HR 76 | Ht 62.0 in | Wt 149.0 lb

## 2021-04-08 DIAGNOSIS — Z8739 Personal history of other diseases of the musculoskeletal system and connective tissue: Secondary | ICD-10-CM

## 2021-04-08 DIAGNOSIS — N184 Chronic kidney disease, stage 4 (severe): Secondary | ICD-10-CM

## 2021-04-08 DIAGNOSIS — M5442 Lumbago with sciatica, left side: Secondary | ICD-10-CM

## 2021-04-08 DIAGNOSIS — M159 Polyosteoarthritis, unspecified: Secondary | ICD-10-CM

## 2021-04-08 DIAGNOSIS — Z8673 Personal history of transient ischemic attack (TIA), and cerebral infarction without residual deficits: Secondary | ICD-10-CM

## 2021-04-08 DIAGNOSIS — R5383 Other fatigue: Secondary | ICD-10-CM | POA: Diagnosis not present

## 2021-04-08 DIAGNOSIS — M545 Low back pain, unspecified: Secondary | ICD-10-CM | POA: Diagnosis not present

## 2021-04-08 DIAGNOSIS — I10 Essential (primary) hypertension: Secondary | ICD-10-CM

## 2021-04-08 MED ORDER — MIRTAZAPINE 7.5 MG PO TABS: 7.5000 mg | ORAL_TABLET | Freq: Every day | ORAL | 1 refills | Status: DC

## 2021-04-08 MED ORDER — LABETALOL HCL 100 MG PO TABS: 100.0000 mg | ORAL_TABLET | Freq: Two times a day (BID) | ORAL | 0 refills | Status: DC

## 2021-04-08 MED ORDER — ASPIRIN-DIPYRIDAMOLE ER 25-200 MG PO CP12: 1.0000 | ORAL_CAPSULE | Freq: Two times a day (BID) | ORAL | 3 refills | Status: DC

## 2021-04-08 MED ORDER — TRAMADOL HCL 50 MG PO TABS: 50.0000 mg | ORAL_TABLET | Freq: Every day | ORAL | 0 refills | Status: DC | PRN

## 2021-04-08 MED ORDER — ATORVASTATIN CALCIUM 20 MG PO TABS: 20.0000 mg | ORAL_TABLET | Freq: Every day | ORAL | 1 refills | Status: DC

## 2021-04-08 MED ORDER — PREDNISONE 10 MG (21) PO TBPK: ORAL_TABLET | ORAL | 0 refills | Status: AC

## 2021-04-08 NOTE — Progress Notes (Signed)
Subjective:    Patient ID: [name], female    DOB: 07/15/1930, 88 y.o.   MRN: [id number]  HPI: [name] is a 88 y.o. female presenting for back pain.  Chief Complaint  Patient presents with   Back Pain    2-3 weeks denies fall   BACK PAIN Duration: weeks Mechanism of injury: unknown, no falls Location: middle of back and down left side Onset: comes and goes Severity: sever Quality: sharp Frequency: intermittent Radiation: yes down below left knee Aggravating factors: walking, standing, laying down, changing positions Alleviating factors: Tylenol Status: stable Treatments attempted:  Tylenol Relief with NSAIDs?: significant Nighttime pain:  no Paresthesias / decreased sensation:  no Bowel / bladder incontinence:  no Fevers:  no Dysuria / urinary frequency:  no  Patient requesting refills of medication to last until she can get in with new PCP.  Allergies  Allergen Reactions   Ciprofloxacin     Acute renal failure   Diphenhydramine Hcl     REACTION: UNKNOWN REACTION   Sulfonamide Derivatives Rash    Outpatient Encounter Medications as of 04/08/2021  Medication Sig   aspirin EC 81 MG tablet Take 81 mg by mouth daily.   diclofenac Sodium (VOLTAREN) 1 % GEL Apply 2 g topically 4 (four) times daily.   fluticasone (FLONASE) 50 MCG/ACT nasal spray Place 2 sprays into both nostrils daily as needed for allergies or rhinitis.   furosemide (LASIX) 20 MG tablet Take 20 mg by mouth 2 (two) times daily.   iron polysaccharides (NIFEREX) 150 MG capsule Take 1 capsule (150 mg total) by mouth daily.   Omega-3 Fatty Acids (OMEGA-3 FISH OIL PO) Take by mouth.   omeprazole (PRILOSEC) 20 MG capsule TAKE 1 CAPSULE(20 MG) BY MOUTH DAILY   predniSONE (STERAPRED UNI-PAK 21 TAB) 10 MG (21) TBPK tablet Take 6 tablets (60 mg total) by mouth daily for 1 day, THEN 5 tablets (50 mg total) daily for 1 day, THEN 4 tablets (40 mg total) daily for 1 day, THEN 3 tablets (30 mg  total) daily for 1 day, THEN 2 tablets (20 mg total) daily for 1 day, THEN 1 tablet (10 mg total) daily for 1 day.   sodium bicarbonate 650 MG tablet TAKE 2 TABLETS(1300 MG) BY MOUTH THREE TIMES DAILY   triamcinolone ointment (KENALOG) 0.1 % Apply to mouth sore twice a day as needed for 1 week   [DISCONTINUED] atorvastatin (LIPITOR) 20 MG tablet TAKE 1 TABLET(20 MG) BY MOUTH DAILY   [DISCONTINUED] dipyridamole-aspirin (AGGRENOX) 200-25 MG 12hr capsule Take 1 capsule by mouth 2 (two) times daily.   [DISCONTINUED] labetalol (NORMODYNE) 100 MG tablet TAKE 1 TABLET BY MOUTH TWICE DAILY   [DISCONTINUED] mirtazapine (REMERON) 7.5 MG tablet Take 1 tablet (7.5 mg total) by mouth at bedtime.   [DISCONTINUED] traMADol (ULTRAM) 50 MG tablet Take 1 tablet (50 mg total) by mouth daily as needed.   atorvastatin (LIPITOR) 20 MG tablet Take 1 tablet (20 mg total) by mouth daily.   dipyridamole-aspirin (AGGRENOX) 200-25 MG 12hr capsule Take 1 capsule by mouth 2 (two) times daily.   labetalol (NORMODYNE) 100 MG tablet Take 1 tablet (100 mg total) by mouth 2 (two) times daily.   mirtazapine (REMERON) 7.5 MG tablet Take 1 tablet (7.5 mg total) by mouth at bedtime.   traMADol (ULTRAM) 50 MG tablet Take 1 tablet (50 mg total) by mouth daily as needed.   No facility-administered encounter medications on file as of 04/08/2021.  Patient Active Problem List   Diagnosis Date Noted   Prediabetes 08/29/2020   Fatigue 08/26/2020   Protein-calorie malnutrition ([place]) 04/19/2020   GERD (gastroesophageal reflux disease) 08/12/2019   Bilateral primary osteoarthritis of knee 11/09/2016   History of stroke 06/02/2016   Osteoarthritis 03/25/2014   Anxiety and depression 02/20/2012   Chronic headache 02/20/2012   Peripheral edema 02/19/2012   Hyperparathyroidism ([place]) 10/23/2008   ANEMIA 10/23/2008   Essential hypertension 10/23/2008   GALLSTONE PANCREATITIS 10/23/2008   Chronic kidney disease 10/23/2008   DEGENERATIVE  JOINT DISEASE 10/23/2008   PROTEINURIA 10/23/2008    Past Medical History:  Diagnosis Date   Anxiety    Arthritis    CKD (chronic kidney disease)    Dr. Hinda Lenis   Depression    GERD (gastroesophageal reflux disease)    Hyperlipidemia    Hypertension    Osteoporosis    Stroke [facility])    UTI (lower urinary tract infection)     Relevant past medical, surgical, family and social history reviewed and updated as indicated. Interim medical history since our last visit reviewed.  Review of Systems Per HPI unless specifically indicated above     Objective:    BP 120/80    Pulse 76    Ht 5\' 2"  (1.575 m)    Wt 149 lb (67.6 kg)    SpO2 97%    BMI 27.25 kg/m   Wt Readings from Last 3 Encounters:  04/08/21 149 lb (67.6 kg)  02/16/21 146 lb 3.2 oz (66.3 kg)  08/26/20 142 lb 9.6 oz (64.7 kg)    Physical Exam Vitals and nursing note reviewed.  Constitutional:      General: She is not in acute distress.    Appearance: Normal appearance. She is not toxic-appearing.  Pulmonary:     Effort: Pulmonary effort is normal. No respiratory distress.  Musculoskeletal:     Lumbar back: Tenderness and bony tenderness present. No spasms. Normal range of motion.       Back:     Right lower leg: No edema.     Left lower leg: No edema.     Comments: Tenderness to palpation in the area marked  Skin:    General: Skin is warm and dry.     Capillary Refill: Capillary refill takes less than 2 seconds.     Coloration: Skin is not jaundiced or pale.     Findings: No erythema.  Neurological:     General: No focal deficit present.     Mental Status: She is alert and oriented to person, place, and time.     Motor: No weakness.     Gait: Gait abnormal (walking with cane).  Psychiatric:        Mood and Affect: Mood normal.        Behavior: Behavior normal.        Thought Content: Thought content normal.        Judgment: Judgment normal.      Assessment & Plan:  1. Acute midline low back pain with  left-sided sciatica Acute.  Patient had MRI in 2009 that showed disc protrusion at L4-5, spinal stenosis, and degenerative disc.  Suspect this may be attributing to pain and I do suspect there may be nerve involvement given pain radiating down leg.  Patient has CKD and we cannot give NSAIDs; start prednisone taper pack to help reduce inflammation.  Will also obtain updated plain imaging of lumbar spine.  Refer to neurosurgeon for further work up  and management as appropriate for this patient.  Patient will let me know Monday if her pain is not improved with use of prednisone.  - DG Lumbar Spine Complete; Future - predniSONE (STERAPRED UNI-PAK 21 TAB) 10 MG (21) TBPK tablet; Take 6 tablets (60 mg total) by mouth daily for 1 day, THEN 5 tablets (50 mg total) daily for 1 day, THEN 4 tablets (40 mg total) daily for 1 day, THEN 3 tablets (30 mg total) daily for 1 day, THEN 2 tablets (20 mg total) daily for 1 day, THEN 1 tablet (10 mg total) daily for 1 day.  Dispense: 1 each; Refill: 0 - Ambulatory referral to Neurosurgery  2. Fatigue, unspecified type Plan to continue Remeron as this has been helping with mood and appetite, refills given while she is looking for new PCP.  - mirtazapine (REMERON) 7.5 MG tablet; Take 1 tablet (7.5 mg total) by mouth at bedtime.  Dispense: 30 tablet; Refill: 1  3. Primary osteoarthritis involving multiple joints Refill given for Tramadol,  She does not take daily and only with very severe pain.  PDMP reviewed.  Refills given while she is looking for new PCP.  - traMADol (ULTRAM) 50 MG tablet; Take 1 tablet (50 mg total) by mouth daily as needed.  Dispense: 90 tablet; Refill: 0  4. History of stroke Continue Aggrenox and atorvastatin 20 mg daily.  Refills given while she is looking for new PCP.  - atorvastatin (LIPITOR) 20 MG tablet; Take 1 tablet (20 mg total) by mouth daily.  Dispense: 90 tablet; Refill: 1 - dipyridamole-aspirin (AGGRENOX) 200-25 MG 12hr capsule; Take  1 capsule by mouth 2 (two) times daily.  Dispense: 180 capsule; Refill: 3  5. Essential hypertension BP well controlled today, continue collaboration with nephrology.  Refills given while she is looking for new PCP.  - labetalol (NORMODYNE) 100 MG tablet; Take 1 tablet (100 mg total) by mouth 2 (two) times daily.  Dispense: 180 tablet; Refill: 0  6. Stage 4 chronic kidney disease ([place]) Continue collaboration with Nephrology.  7. History of degenerative disc disease  - predniSONE (STERAPRED UNI-PAK 21 TAB) 10 MG (21) TBPK tablet; Take 6 tablets (60 mg total) by mouth daily for 1 day, THEN 5 tablets (50 mg total) daily for 1 day, THEN 4 tablets (40 mg total) daily for 1 day, THEN 3 tablets (30 mg total) daily for 1 day, THEN 2 tablets (20 mg total) daily for 1 day, THEN 1 tablet (10 mg total) daily for 1 day.  Dispense: 1 each; Refill: 0 - Ambulatory referral to Neurosurgery     Follow up plan: Return for with new pcp.

## 2021-04-09 ENCOUNTER — Other Ambulatory Visit: Payer: Self-pay | Admitting: Nurse Practitioner

## 2021-04-11 ENCOUNTER — Telehealth: Payer: Self-pay

## 2021-04-11 NOTE — Telephone Encounter (Signed)
-----   Message from Eulogio Bear, NP sent at 04/10/2021 10:52 AM EST ----- Please notify lumbar x-ray shows ongoing age-related changes.  Please have her let us know if she has not heard from neurosurgeon's office in 1-2 weeks.

## 2021-04-11 NOTE — Telephone Encounter (Signed)
Informed patient daughter Vivien Rota of results and recommendations.

## 2021-04-18 DIAGNOSIS — Z6829 Body mass index (BMI) 29.0-29.9, adult: Secondary | ICD-10-CM | POA: Diagnosis not present

## 2021-04-18 DIAGNOSIS — M4156 Other secondary scoliosis, lumbar region: Secondary | ICD-10-CM | POA: Diagnosis not present

## 2021-04-18 DIAGNOSIS — I10 Essential (primary) hypertension: Secondary | ICD-10-CM | POA: Diagnosis not present

## 2021-04-26 DIAGNOSIS — N184 Chronic kidney disease, stage 4 (severe): Secondary | ICD-10-CM | POA: Diagnosis not present

## 2021-04-26 DIAGNOSIS — M4156 Other secondary scoliosis, lumbar region: Secondary | ICD-10-CM | POA: Diagnosis not present

## 2021-04-26 DIAGNOSIS — R600 Localized edema: Secondary | ICD-10-CM | POA: Diagnosis not present

## 2021-04-26 DIAGNOSIS — N189 Chronic kidney disease, unspecified: Secondary | ICD-10-CM | POA: Diagnosis not present

## 2021-04-26 DIAGNOSIS — D631 Anemia in chronic kidney disease: Secondary | ICD-10-CM | POA: Diagnosis not present

## 2021-04-26 DIAGNOSIS — I129 Hypertensive chronic kidney disease with stage 1 through stage 4 chronic kidney disease, or unspecified chronic kidney disease: Secondary | ICD-10-CM | POA: Diagnosis not present

## 2021-04-26 DIAGNOSIS — M4126 Other idiopathic scoliosis, lumbar region: Secondary | ICD-10-CM | POA: Diagnosis not present

## 2021-04-26 DIAGNOSIS — M48061 Spinal stenosis, lumbar region without neurogenic claudication: Secondary | ICD-10-CM | POA: Diagnosis not present

## 2021-04-26 DIAGNOSIS — M545 Low back pain, unspecified: Secondary | ICD-10-CM | POA: Diagnosis not present

## 2021-04-28 ENCOUNTER — Telehealth: Payer: Self-pay | Admitting: Nurse Practitioner

## 2021-04-28 DIAGNOSIS — R809 Proteinuria, unspecified: Secondary | ICD-10-CM | POA: Diagnosis not present

## 2021-04-28 DIAGNOSIS — E211 Secondary hyperparathyroidism, not elsewhere classified: Secondary | ICD-10-CM | POA: Diagnosis not present

## 2021-04-28 DIAGNOSIS — R5383 Other fatigue: Secondary | ICD-10-CM

## 2021-04-28 DIAGNOSIS — I129 Hypertensive chronic kidney disease with stage 1 through stage 4 chronic kidney disease, or unspecified chronic kidney disease: Secondary | ICD-10-CM | POA: Diagnosis not present

## 2021-04-28 DIAGNOSIS — Z8673 Personal history of transient ischemic attack (TIA), and cerebral infarction without residual deficits: Secondary | ICD-10-CM

## 2021-04-28 DIAGNOSIS — N189 Chronic kidney disease, unspecified: Secondary | ICD-10-CM | POA: Diagnosis not present

## 2021-04-28 DIAGNOSIS — D631 Anemia in chronic kidney disease: Secondary | ICD-10-CM | POA: Diagnosis not present

## 2021-04-28 DIAGNOSIS — M159 Polyosteoarthritis, unspecified: Secondary | ICD-10-CM

## 2021-04-28 DIAGNOSIS — I10 Essential (primary) hypertension: Secondary | ICD-10-CM

## 2021-04-28 DIAGNOSIS — I120 Hypertensive chronic kidney disease with stage 5 chronic kidney disease or end stage renal disease: Secondary | ICD-10-CM | POA: Diagnosis not present

## 2021-04-28 DIAGNOSIS — N185 Chronic kidney disease, stage 5: Secondary | ICD-10-CM | POA: Diagnosis not present

## 2021-04-28 NOTE — Telephone Encounter (Signed)
Patient scheduled for an appointment on Tues 2/7 for medication management. Requesting call back; wants to know if meds can be updated without coming in to the office.  Please advise at [phone].

## 2021-04-29 MED ORDER — OMEPRAZOLE 20 MG PO CPDR: DELAYED_RELEASE_CAPSULE | ORAL | 2 refills | Status: DC

## 2021-04-29 MED ORDER — MIRTAZAPINE 7.5 MG PO TABS: 7.5000 mg | ORAL_TABLET | Freq: Every day | ORAL | 1 refills | Status: DC

## 2021-04-29 MED ORDER — LABETALOL HCL 100 MG PO TABS: 100.0000 mg | ORAL_TABLET | Freq: Two times a day (BID) | ORAL | 0 refills | Status: DC

## 2021-04-29 NOTE — Telephone Encounter (Signed)
Please find out what meds she needs; we had a visit last month and I think I sent in most of them

## 2021-04-29 NOTE — Telephone Encounter (Signed)
Refills sent in.  Not going to send in further refills of Tramadol as she received 90 pills last month; she should be taking very sparingly for pain and should not need further refills until she sees new PCP.

## 2021-05-02 DIAGNOSIS — Z6828 Body mass index (BMI) 28.0-28.9, adult: Secondary | ICD-10-CM | POA: Diagnosis not present

## 2021-05-02 DIAGNOSIS — M4156 Other secondary scoliosis, lumbar region: Secondary | ICD-10-CM | POA: Diagnosis not present

## 2021-05-02 DIAGNOSIS — M48061 Spinal stenosis, lumbar region without neurogenic claudication: Secondary | ICD-10-CM | POA: Diagnosis not present

## 2021-05-02 NOTE — Telephone Encounter (Signed)
Spoke with patient daughter and advised of message.

## 2021-05-03 ENCOUNTER — Ambulatory Visit: Payer: Medicare HMO | Admitting: Nurse Practitioner

## 2021-05-18 ENCOUNTER — Ambulatory Visit: Payer: Medicare HMO | Admitting: Nurse Practitioner

## 2021-07-08 DIAGNOSIS — N185 Chronic kidney disease, stage 5: Secondary | ICD-10-CM | POA: Diagnosis not present

## 2021-07-08 DIAGNOSIS — E211 Secondary hyperparathyroidism, not elsewhere classified: Secondary | ICD-10-CM | POA: Diagnosis not present

## 2021-07-08 DIAGNOSIS — I129 Hypertensive chronic kidney disease with stage 1 through stage 4 chronic kidney disease, or unspecified chronic kidney disease: Secondary | ICD-10-CM | POA: Diagnosis not present

## 2021-07-08 DIAGNOSIS — N189 Chronic kidney disease, unspecified: Secondary | ICD-10-CM | POA: Diagnosis not present

## 2021-07-08 DIAGNOSIS — R809 Proteinuria, unspecified: Secondary | ICD-10-CM | POA: Diagnosis not present

## 2021-07-08 DIAGNOSIS — I120 Hypertensive chronic kidney disease with stage 5 chronic kidney disease or end stage renal disease: Secondary | ICD-10-CM | POA: Diagnosis not present

## 2021-07-08 DIAGNOSIS — D631 Anemia in chronic kidney disease: Secondary | ICD-10-CM | POA: Diagnosis not present

## 2021-07-16 DIAGNOSIS — E875 Hyperkalemia: Secondary | ICD-10-CM | POA: Diagnosis not present

## 2021-07-16 DIAGNOSIS — D631 Anemia in chronic kidney disease: Secondary | ICD-10-CM | POA: Diagnosis not present

## 2021-07-16 DIAGNOSIS — R809 Proteinuria, unspecified: Secondary | ICD-10-CM | POA: Diagnosis not present

## 2021-07-16 DIAGNOSIS — I129 Hypertensive chronic kidney disease with stage 1 through stage 4 chronic kidney disease, or unspecified chronic kidney disease: Secondary | ICD-10-CM | POA: Diagnosis not present

## 2021-07-16 DIAGNOSIS — E211 Secondary hyperparathyroidism, not elsewhere classified: Secondary | ICD-10-CM | POA: Diagnosis not present

## 2021-07-16 DIAGNOSIS — N184 Chronic kidney disease, stage 4 (severe): Secondary | ICD-10-CM | POA: Diagnosis not present

## 2021-07-16 DIAGNOSIS — E8721 Acute metabolic acidosis: Secondary | ICD-10-CM | POA: Diagnosis not present

## 2021-07-16 DIAGNOSIS — N189 Chronic kidney disease, unspecified: Secondary | ICD-10-CM | POA: Diagnosis not present

## 2021-07-29 ENCOUNTER — Emergency Department (HOSPITAL_COMMUNITY): Payer: Medicare HMO

## 2021-07-29 ENCOUNTER — Encounter (HOSPITAL_COMMUNITY): Payer: Self-pay | Admitting: Emergency Medicine

## 2021-07-29 ENCOUNTER — Emergency Department (HOSPITAL_COMMUNITY)
Admission: EM | Admit: 2021-07-29 | Discharge: 2021-07-30 | Disposition: A | Discharge status: Home / Self Care | Payer: Medicare HMO | Attending: Emergency Medicine | Admitting: Emergency Medicine

## 2021-07-29 DIAGNOSIS — M542 Cervicalgia: Secondary | ICD-10-CM | POA: Insufficient documentation

## 2021-07-29 DIAGNOSIS — M2578 Osteophyte, vertebrae: Secondary | ICD-10-CM | POA: Diagnosis not present

## 2021-07-29 DIAGNOSIS — E875 Hyperkalemia: Secondary | ICD-10-CM | POA: Diagnosis not present

## 2021-07-29 DIAGNOSIS — E8721 Acute metabolic acidosis: Secondary | ICD-10-CM | POA: Diagnosis not present

## 2021-07-29 DIAGNOSIS — I7121 Aneurysm of the ascending aorta, without rupture: Secondary | ICD-10-CM | POA: Diagnosis not present

## 2021-07-29 DIAGNOSIS — Z7982 Long term (current) use of aspirin: Secondary | ICD-10-CM | POA: Insufficient documentation

## 2021-07-29 DIAGNOSIS — N189 Chronic kidney disease, unspecified: Secondary | ICD-10-CM | POA: Insufficient documentation

## 2021-07-29 DIAGNOSIS — Z743 Need for continuous supervision: Secondary | ICD-10-CM | POA: Diagnosis not present

## 2021-07-29 DIAGNOSIS — G319 Degenerative disease of nervous system, unspecified: Secondary | ICD-10-CM | POA: Diagnosis not present

## 2021-07-29 DIAGNOSIS — Y92410 Unspecified street and highway as the place of occurrence of the external cause: Secondary | ICD-10-CM | POA: Diagnosis not present

## 2021-07-29 DIAGNOSIS — E211 Secondary hyperparathyroidism, not elsewhere classified: Secondary | ICD-10-CM | POA: Diagnosis not present

## 2021-07-29 DIAGNOSIS — S8001XA Contusion of right knee, initial encounter: Secondary | ICD-10-CM | POA: Insufficient documentation

## 2021-07-29 DIAGNOSIS — M25562 Pain in left knee: Secondary | ICD-10-CM | POA: Diagnosis not present

## 2021-07-29 DIAGNOSIS — M4313 Spondylolisthesis, cervicothoracic region: Secondary | ICD-10-CM | POA: Diagnosis not present

## 2021-07-29 DIAGNOSIS — D259 Leiomyoma of uterus, unspecified: Secondary | ICD-10-CM | POA: Insufficient documentation

## 2021-07-29 DIAGNOSIS — R519 Headache, unspecified: Secondary | ICD-10-CM | POA: Insufficient documentation

## 2021-07-29 DIAGNOSIS — S3991XA Unspecified injury of abdomen, initial encounter: Secondary | ICD-10-CM | POA: Diagnosis not present

## 2021-07-29 DIAGNOSIS — I129 Hypertensive chronic kidney disease with stage 1 through stage 4 chronic kidney disease, or unspecified chronic kidney disease: Secondary | ICD-10-CM | POA: Diagnosis not present

## 2021-07-29 DIAGNOSIS — Z041 Encounter for examination and observation following transport accident: Secondary | ICD-10-CM | POA: Diagnosis not present

## 2021-07-29 DIAGNOSIS — R102 Pelvic and perineal pain: Secondary | ICD-10-CM | POA: Diagnosis not present

## 2021-07-29 DIAGNOSIS — N289 Disorder of kidney and ureter, unspecified: Secondary | ICD-10-CM | POA: Diagnosis not present

## 2021-07-29 DIAGNOSIS — S299XXA Unspecified injury of thorax, initial encounter: Secondary | ICD-10-CM | POA: Diagnosis not present

## 2021-07-29 DIAGNOSIS — N184 Chronic kidney disease, stage 4 (severe): Secondary | ICD-10-CM | POA: Diagnosis not present

## 2021-07-29 DIAGNOSIS — M25561 Pain in right knee: Secondary | ICD-10-CM | POA: Diagnosis not present

## 2021-07-29 DIAGNOSIS — M159 Polyosteoarthritis, unspecified: Secondary | ICD-10-CM

## 2021-07-29 DIAGNOSIS — I700 Atherosclerosis of aorta: Secondary | ICD-10-CM | POA: Diagnosis not present

## 2021-07-29 DIAGNOSIS — M25462 Effusion, left knee: Secondary | ICD-10-CM | POA: Diagnosis not present

## 2021-07-29 DIAGNOSIS — S8992XA Unspecified injury of left lower leg, initial encounter: Secondary | ICD-10-CM | POA: Diagnosis not present

## 2021-07-29 DIAGNOSIS — M810 Age-related osteoporosis without current pathological fracture: Secondary | ICD-10-CM | POA: Diagnosis not present

## 2021-07-29 DIAGNOSIS — D631 Anemia in chronic kidney disease: Secondary | ICD-10-CM | POA: Diagnosis not present

## 2021-07-29 DIAGNOSIS — M549 Dorsalgia, unspecified: Secondary | ICD-10-CM | POA: Diagnosis not present

## 2021-07-29 DIAGNOSIS — M1712 Unilateral primary osteoarthritis, left knee: Secondary | ICD-10-CM | POA: Diagnosis not present

## 2021-07-29 DIAGNOSIS — I10 Essential (primary) hypertension: Secondary | ICD-10-CM | POA: Diagnosis not present

## 2021-07-29 DIAGNOSIS — M25511 Pain in right shoulder: Secondary | ICD-10-CM | POA: Insufficient documentation

## 2021-07-29 DIAGNOSIS — R809 Proteinuria, unspecified: Secondary | ICD-10-CM | POA: Diagnosis not present

## 2021-07-29 DIAGNOSIS — I6523 Occlusion and stenosis of bilateral carotid arteries: Secondary | ICD-10-CM | POA: Diagnosis not present

## 2021-07-29 DIAGNOSIS — M25461 Effusion, right knee: Secondary | ICD-10-CM | POA: Diagnosis not present

## 2021-07-29 DIAGNOSIS — S8991XA Unspecified injury of right lower leg, initial encounter: Secondary | ICD-10-CM | POA: Diagnosis present

## 2021-07-29 DIAGNOSIS — S8000XA Contusion of unspecified knee, initial encounter: Secondary | ICD-10-CM

## 2021-07-29 DIAGNOSIS — S8002XA Contusion of left knee, initial encounter: Secondary | ICD-10-CM | POA: Insufficient documentation

## 2021-07-29 DIAGNOSIS — M1711 Unilateral primary osteoarthritis, right knee: Secondary | ICD-10-CM | POA: Diagnosis not present

## 2021-07-29 LAB — I-STAT CHEM 8, ED
BUN: 59 mg/dL — ABNORMAL HIGH (ref 8–23)
Calcium, Ion: 1.32 mmol/L (ref 1.15–1.40)
Chloride: 115 mmol/L — ABNORMAL HIGH (ref 98–111)
Creatinine, Ser: 3.5 mg/dL — ABNORMAL HIGH (ref 0.44–1.00)
Glucose, Bld: 115 mg/dL — ABNORMAL HIGH (ref 70–99)
HCT: 38 % (ref 36.0–46.0)
Hemoglobin: 12.9 g/dL (ref 12.0–15.0)
Potassium: 4.3 mmol/L (ref 3.5–5.1)
Sodium: 144 mmol/L (ref 135–145)
TCO2: 20 mmol/L — ABNORMAL LOW (ref 22–32)

## 2021-07-29 LAB — CBC WITH DIFFERENTIAL/PLATELET
Abs Immature Granulocytes: 0.02 10*3/uL (ref 0.00–0.07)
Basophils Absolute: 0 10*3/uL (ref 0.0–0.1)
Basophils Relative: 1 %
Eosinophils Absolute: 0.2 10*3/uL (ref 0.0–0.5)
Eosinophils Relative: 3 %
HCT: 36.9 % (ref 36.0–46.0)
Hemoglobin: 11.9 g/dL — ABNORMAL LOW (ref 12.0–15.0)
Immature Granulocytes: 0 %
Lymphocytes Relative: 30 %
Lymphs Abs: 2 10*3/uL (ref 0.7–4.0)
MCH: 33.1 pg (ref 26.0–34.0)
MCHC: 32.2 g/dL (ref 30.0–36.0)
MCV: 102.5 fL — ABNORMAL HIGH (ref 80.0–100.0)
Monocytes Absolute: 0.7 10*3/uL (ref 0.1–1.0)
Monocytes Relative: 11 %
Neutro Abs: 3.6 10*3/uL (ref 1.7–7.7)
Neutrophils Relative %: 55 %
Platelets: 144 10*3/uL — ABNORMAL LOW (ref 150–400)
RBC: 3.6 MIL/uL — ABNORMAL LOW (ref 3.87–5.11)
RDW: 15.9 % — ABNORMAL HIGH (ref 11.5–15.5)
WBC: 6.6 10*3/uL (ref 4.0–10.5)
nRBC: 0 % (ref 0.0–0.2)

## 2021-07-29 LAB — COMPREHENSIVE METABOLIC PANEL
ALT: 20 U/L (ref 0–44)
AST: 22 U/L (ref 15–41)
Albumin: 3.9 g/dL (ref 3.5–5.0)
Alkaline Phosphatase: 64 U/L (ref 38–126)
Anion gap: 8 (ref 5–15)
BUN: 60 mg/dL — ABNORMAL HIGH (ref 8–23)
CO2: 20 mmol/L — ABNORMAL LOW (ref 22–32)
Calcium: 10.1 mg/dL (ref 8.9–10.3)
Chloride: 116 mmol/L — ABNORMAL HIGH (ref 98–111)
Creatinine, Ser: 3.35 mg/dL — ABNORMAL HIGH (ref 0.44–1.00)
GFR, Estimated: 13 mL/min — ABNORMAL LOW (ref >60)
Glucose, Bld: 119 mg/dL — ABNORMAL HIGH (ref 70–99)
Potassium: 4.4 mmol/L (ref 3.5–5.1)
Sodium: 144 mmol/L (ref 135–145)
Total Bilirubin: 0.6 mg/dL (ref 0.3–1.2)
Total Protein: 7 g/dL (ref 6.5–8.1)

## 2021-07-29 LAB — PROTIME-INR
INR: 1 (ref 0.8–1.2)
Prothrombin Time: 13.4 seconds (ref 11.4–15.2)

## 2021-07-29 LAB — TROPONIN I (HIGH SENSITIVITY): Troponin I (High Sensitivity): 6 ng/L (ref <18)

## 2021-07-29 MED ORDER — LABETALOL HCL 200 MG PO TABS
100.0000 mg | ORAL_TABLET | Freq: Once | ORAL | Status: AC
  Administered 2021-07-29: 100 mg via ORAL
  Filled 2021-07-29: qty 1

## 2021-07-29 MED ORDER — LACTATED RINGERS IV BOLUS
500.0000 mL | Freq: Once | INTRAVENOUS | Status: AC
  Administered 2021-07-29: 500 mL via INTRAVENOUS

## 2021-07-29 MED ORDER — HYDROCODONE-ACETAMINOPHEN 5-325 MG PO TABS
1.0000 | ORAL_TABLET | Freq: Once | ORAL | Status: AC
  Administered 2021-07-30: 1 via ORAL
  Filled 2021-07-29: qty 1

## 2021-07-29 MED ORDER — AMLODIPINE BESYLATE 5 MG PO TABS
5.0000 mg | ORAL_TABLET | Freq: Once | ORAL | Status: AC
  Administered 2021-07-29: 5 mg via ORAL
  Filled 2021-07-29: qty 1
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
## 2021-07-30 ENCOUNTER — Emergency Department (HOSPITAL_COMMUNITY): Payer: Medicare HMO

## 2021-07-30 DIAGNOSIS — M1711 Unilateral primary osteoarthritis, right knee: Secondary | ICD-10-CM | POA: Diagnosis not present

## 2021-07-30 DIAGNOSIS — M25461 Effusion, right knee: Secondary | ICD-10-CM | POA: Diagnosis not present

## 2021-07-30 DIAGNOSIS — M1712 Unilateral primary osteoarthritis, left knee: Secondary | ICD-10-CM | POA: Diagnosis not present

## 2021-07-30 DIAGNOSIS — M810 Age-related osteoporosis without current pathological fracture: Secondary | ICD-10-CM | POA: Diagnosis not present

## 2021-07-30 DIAGNOSIS — S8992XA Unspecified injury of left lower leg, initial encounter: Secondary | ICD-10-CM | POA: Diagnosis not present

## 2021-07-30 DIAGNOSIS — S8991XA Unspecified injury of right lower leg, initial encounter: Secondary | ICD-10-CM | POA: Diagnosis not present

## 2021-07-30 DIAGNOSIS — M25462 Effusion, left knee: Secondary | ICD-10-CM | POA: Diagnosis not present

## 2021-07-30 LAB — TROPONIN I (HIGH SENSITIVITY): Troponin I (High Sensitivity): 6 ng/L (ref <18)

## 2021-07-30 MED ORDER — TRAMADOL HCL 50 MG PO TABS: 50.0000 mg | ORAL_TABLET | Freq: Every day | ORAL | 0 refills | Status: DC | PRN
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
## 2021-08-19 ENCOUNTER — Institutional Professional Consult (permissible substitution): Payer: Medicare HMO | Admitting: Thoracic Surgery (Cardiothoracic Vascular Surgery)

## 2021-08-19 VITALS — BP 148/80 | HR 61 | Resp 20 | Ht 62.0 in

## 2021-08-19 DIAGNOSIS — I7121 Aneurysm of the ascending aorta, without rupture: Secondary | ICD-10-CM | POA: Diagnosis not present

## 2021-08-24 DIAGNOSIS — I129 Hypertensive chronic kidney disease with stage 1 through stage 4 chronic kidney disease, or unspecified chronic kidney disease: Secondary | ICD-10-CM | POA: Diagnosis not present

## 2021-08-24 DIAGNOSIS — R630 Anorexia: Secondary | ICD-10-CM | POA: Diagnosis not present

## 2021-08-24 DIAGNOSIS — R7303 Prediabetes: Secondary | ICD-10-CM | POA: Diagnosis not present

## 2021-08-24 DIAGNOSIS — N184 Chronic kidney disease, stage 4 (severe): Secondary | ICD-10-CM | POA: Diagnosis not present

## 2021-08-24 DIAGNOSIS — N2581 Secondary hyperparathyroidism of renal origin: Secondary | ICD-10-CM | POA: Diagnosis not present

## 2021-08-24 DIAGNOSIS — I719 Aortic aneurysm of unspecified site, without rupture: Secondary | ICD-10-CM | POA: Diagnosis not present

## 2021-08-24 DIAGNOSIS — I700 Atherosclerosis of aorta: Secondary | ICD-10-CM | POA: Diagnosis not present

## 2021-08-24 DIAGNOSIS — M4126 Other idiopathic scoliosis, lumbar region: Secondary | ICD-10-CM | POA: Diagnosis not present

## 2021-08-31 DIAGNOSIS — N184 Chronic kidney disease, stage 4 (severe): Secondary | ICD-10-CM | POA: Diagnosis not present

## 2021-08-31 DIAGNOSIS — I129 Hypertensive chronic kidney disease with stage 1 through stage 4 chronic kidney disease, or unspecified chronic kidney disease: Secondary | ICD-10-CM | POA: Diagnosis not present

## 2021-08-31 DIAGNOSIS — E211 Secondary hyperparathyroidism, not elsewhere classified: Secondary | ICD-10-CM | POA: Diagnosis not present

## 2021-08-31 DIAGNOSIS — E8721 Acute metabolic acidosis: Secondary | ICD-10-CM | POA: Diagnosis not present

## 2021-08-31 DIAGNOSIS — E875 Hyperkalemia: Secondary | ICD-10-CM | POA: Diagnosis not present

## 2021-08-31 DIAGNOSIS — D631 Anemia in chronic kidney disease: Secondary | ICD-10-CM | POA: Diagnosis not present

## 2021-08-31 DIAGNOSIS — N189 Chronic kidney disease, unspecified: Secondary | ICD-10-CM | POA: Diagnosis not present

## 2021-08-31 DIAGNOSIS — R809 Proteinuria, unspecified: Secondary | ICD-10-CM | POA: Diagnosis not present

## 2021-09-07 DIAGNOSIS — N2581 Secondary hyperparathyroidism of renal origin: Secondary | ICD-10-CM | POA: Diagnosis not present

## 2021-09-07 DIAGNOSIS — I700 Atherosclerosis of aorta: Secondary | ICD-10-CM | POA: Diagnosis not present

## 2021-09-07 DIAGNOSIS — I10 Essential (primary) hypertension: Secondary | ICD-10-CM | POA: Diagnosis not present

## 2021-09-07 DIAGNOSIS — N184 Chronic kidney disease, stage 4 (severe): Secondary | ICD-10-CM | POA: Diagnosis not present

## 2021-09-07 DIAGNOSIS — R269 Unspecified abnormalities of gait and mobility: Secondary | ICD-10-CM | POA: Diagnosis not present

## 2021-09-07 DIAGNOSIS — I719 Aortic aneurysm of unspecified site, without rupture: Secondary | ICD-10-CM | POA: Diagnosis not present

## 2021-09-07 DIAGNOSIS — Z0000 Encounter for general adult medical examination without abnormal findings: Secondary | ICD-10-CM | POA: Diagnosis not present

## 2021-09-07 DIAGNOSIS — R7303 Prediabetes: Secondary | ICD-10-CM | POA: Diagnosis not present

## 2021-09-07 DIAGNOSIS — R630 Anorexia: Secondary | ICD-10-CM | POA: Diagnosis not present

## 2021-09-07 DIAGNOSIS — Z1331 Encounter for screening for depression: Secondary | ICD-10-CM | POA: Diagnosis not present

## 2021-09-07 DIAGNOSIS — M4126 Other idiopathic scoliosis, lumbar region: Secondary | ICD-10-CM | POA: Diagnosis not present

## 2021-09-09 DIAGNOSIS — E211 Secondary hyperparathyroidism, not elsewhere classified: Secondary | ICD-10-CM | POA: Diagnosis not present

## 2021-09-09 DIAGNOSIS — N184 Chronic kidney disease, stage 4 (severe): Secondary | ICD-10-CM | POA: Diagnosis not present

## 2021-09-09 DIAGNOSIS — I129 Hypertensive chronic kidney disease with stage 1 through stage 4 chronic kidney disease, or unspecified chronic kidney disease: Secondary | ICD-10-CM | POA: Diagnosis not present

## 2021-09-09 DIAGNOSIS — D631 Anemia in chronic kidney disease: Secondary | ICD-10-CM | POA: Diagnosis not present

## 2021-09-09 DIAGNOSIS — I952 Hypotension due to drugs: Secondary | ICD-10-CM | POA: Diagnosis not present

## 2021-09-09 DIAGNOSIS — R809 Proteinuria, unspecified: Secondary | ICD-10-CM | POA: Diagnosis not present

## 2021-09-09 DIAGNOSIS — R269 Unspecified abnormalities of gait and mobility: Secondary | ICD-10-CM | POA: Diagnosis not present

## 2021-09-09 DIAGNOSIS — N189 Chronic kidney disease, unspecified: Secondary | ICD-10-CM | POA: Diagnosis not present

## 2021-09-09 DIAGNOSIS — E875 Hyperkalemia: Secondary | ICD-10-CM | POA: Diagnosis not present

## 2021-09-12 DIAGNOSIS — E875 Hyperkalemia: Secondary | ICD-10-CM | POA: Diagnosis not present

## 2021-09-12 DIAGNOSIS — E211 Secondary hyperparathyroidism, not elsewhere classified: Secondary | ICD-10-CM | POA: Diagnosis not present

## 2021-09-12 DIAGNOSIS — I129 Hypertensive chronic kidney disease with stage 1 through stage 4 chronic kidney disease, or unspecified chronic kidney disease: Secondary | ICD-10-CM | POA: Diagnosis not present

## 2021-09-12 DIAGNOSIS — D631 Anemia in chronic kidney disease: Secondary | ICD-10-CM | POA: Diagnosis not present

## 2021-09-12 DIAGNOSIS — R809 Proteinuria, unspecified: Secondary | ICD-10-CM | POA: Diagnosis not present

## 2021-09-12 DIAGNOSIS — N184 Chronic kidney disease, stage 4 (severe): Secondary | ICD-10-CM | POA: Diagnosis not present

## 2021-09-12 DIAGNOSIS — N189 Chronic kidney disease, unspecified: Secondary | ICD-10-CM | POA: Diagnosis not present

## 2021-09-12 DIAGNOSIS — N170 Acute kidney failure with tubular necrosis: Secondary | ICD-10-CM | POA: Diagnosis not present

## 2021-09-19 DIAGNOSIS — R5381 Other malaise: Secondary | ICD-10-CM | POA: Diagnosis not present

## 2021-09-19 DIAGNOSIS — I10 Essential (primary) hypertension: Secondary | ICD-10-CM | POA: Diagnosis not present

## 2021-09-20 DIAGNOSIS — R5381 Other malaise: Secondary | ICD-10-CM | POA: Diagnosis not present

## 2021-09-22 ENCOUNTER — Inpatient Hospital Stay (HOSPITAL_COMMUNITY)
Admission: EM | Admit: 2021-09-22 | Discharge: 2021-09-24 | DRG: 683 | Disposition: A | Discharge status: Home Health | Payer: Medicare HMO | Attending: Internal Medicine | Admitting: Internal Medicine

## 2021-09-22 ENCOUNTER — Other Ambulatory Visit: Payer: Self-pay

## 2021-09-22 ENCOUNTER — Observation Stay (HOSPITAL_COMMUNITY): Payer: Medicare HMO

## 2021-09-22 ENCOUNTER — Encounter (HOSPITAL_COMMUNITY): Payer: Self-pay | Admitting: *Deleted

## 2021-09-22 DIAGNOSIS — I10 Essential (primary) hypertension: Secondary | ICD-10-CM | POA: Diagnosis not present

## 2021-09-22 DIAGNOSIS — E782 Mixed hyperlipidemia: Secondary | ICD-10-CM | POA: Diagnosis present

## 2021-09-22 DIAGNOSIS — F32A Depression, unspecified: Secondary | ICD-10-CM | POA: Diagnosis present

## 2021-09-22 DIAGNOSIS — N390 Urinary tract infection, site not specified: Secondary | ICD-10-CM | POA: Diagnosis present

## 2021-09-22 DIAGNOSIS — N179 Acute kidney failure, unspecified: Secondary | ICD-10-CM | POA: Diagnosis not present

## 2021-09-22 DIAGNOSIS — D631 Anemia in chronic kidney disease: Secondary | ICD-10-CM | POA: Diagnosis not present

## 2021-09-22 DIAGNOSIS — K219 Gastro-esophageal reflux disease without esophagitis: Secondary | ICD-10-CM | POA: Diagnosis present

## 2021-09-22 DIAGNOSIS — F419 Anxiety disorder, unspecified: Secondary | ICD-10-CM | POA: Diagnosis present

## 2021-09-22 DIAGNOSIS — N184 Chronic kidney disease, stage 4 (severe): Secondary | ICD-10-CM | POA: Diagnosis present

## 2021-09-22 DIAGNOSIS — E860 Dehydration: Secondary | ICD-10-CM | POA: Diagnosis present

## 2021-09-22 DIAGNOSIS — I9589 Other hypotension: Secondary | ICD-10-CM | POA: Diagnosis not present

## 2021-09-22 DIAGNOSIS — N185 Chronic kidney disease, stage 5: Secondary | ICD-10-CM | POA: Diagnosis not present

## 2021-09-22 DIAGNOSIS — Z8673 Personal history of transient ischemic attack (TIA), and cerebral infarction without residual deficits: Secondary | ICD-10-CM

## 2021-09-22 DIAGNOSIS — K5730 Diverticulosis of large intestine without perforation or abscess without bleeding: Secondary | ICD-10-CM | POA: Diagnosis not present

## 2021-09-22 DIAGNOSIS — I712 Thoracic aortic aneurysm, without rupture, unspecified: Secondary | ICD-10-CM | POA: Diagnosis not present

## 2021-09-22 DIAGNOSIS — Z8679 Personal history of other diseases of the circulatory system: Secondary | ICD-10-CM | POA: Diagnosis not present

## 2021-09-22 DIAGNOSIS — I120 Hypertensive chronic kidney disease with stage 5 chronic kidney disease or end stage renal disease: Secondary | ICD-10-CM | POA: Diagnosis not present

## 2021-09-22 DIAGNOSIS — Z888 Allergy status to other drugs, medicaments and biological substances status: Secondary | ICD-10-CM

## 2021-09-22 DIAGNOSIS — N170 Acute kidney failure with tubular necrosis: Secondary | ICD-10-CM | POA: Diagnosis not present

## 2021-09-22 DIAGNOSIS — R7989 Other specified abnormal findings of blood chemistry: Secondary | ICD-10-CM | POA: Diagnosis not present

## 2021-09-22 DIAGNOSIS — K429 Umbilical hernia without obstruction or gangrene: Secondary | ICD-10-CM | POA: Diagnosis not present

## 2021-09-22 DIAGNOSIS — Z882 Allergy status to sulfonamides status: Secondary | ICD-10-CM

## 2021-09-22 DIAGNOSIS — Z9049 Acquired absence of other specified parts of digestive tract: Secondary | ICD-10-CM

## 2021-09-22 DIAGNOSIS — Z881 Allergy status to other antibiotic agents status: Secondary | ICD-10-CM

## 2021-09-22 DIAGNOSIS — Z79899 Other long term (current) drug therapy: Secondary | ICD-10-CM

## 2021-09-22 DIAGNOSIS — M810 Age-related osteoporosis without current pathological fracture: Secondary | ICD-10-CM | POA: Diagnosis present

## 2021-09-22 DIAGNOSIS — D539 Nutritional anemia, unspecified: Secondary | ICD-10-CM | POA: Diagnosis not present

## 2021-09-22 DIAGNOSIS — I129 Hypertensive chronic kidney disease with stage 1 through stage 4 chronic kidney disease, or unspecified chronic kidney disease: Secondary | ICD-10-CM | POA: Diagnosis present

## 2021-09-22 DIAGNOSIS — N189 Chronic kidney disease, unspecified: Secondary | ICD-10-CM | POA: Diagnosis not present

## 2021-09-22 DIAGNOSIS — E869 Volume depletion, unspecified: Secondary | ICD-10-CM | POA: Diagnosis present

## 2021-09-22 LAB — CBC WITH DIFFERENTIAL/PLATELET
Abs Immature Granulocytes: 0.04 10*3/uL (ref 0.00–0.07)
Basophils Absolute: 0 10*3/uL (ref 0.0–0.1)
Basophils Relative: 1 %
Eosinophils Absolute: 0.2 10*3/uL (ref 0.0–0.5)
Eosinophils Relative: 2 %
HCT: 36.4 % (ref 36.0–46.0)
Hemoglobin: 11.2 g/dL — ABNORMAL LOW (ref 12.0–15.0)
Immature Granulocytes: 1 %
Lymphocytes Relative: 25 %
Lymphs Abs: 1.7 10*3/uL (ref 0.7–4.0)
MCH: 32.5 pg (ref 26.0–34.0)
MCHC: 30.8 g/dL (ref 30.0–36.0)
MCV: 105.5 fL — ABNORMAL HIGH (ref 80.0–100.0)
Monocytes Absolute: 0.8 10*3/uL (ref 0.1–1.0)
Monocytes Relative: 11 %
Neutro Abs: 4 10*3/uL (ref 1.7–7.7)
Neutrophils Relative %: 60 %
Platelets: 169 10*3/uL (ref 150–400)
RBC: 3.45 MIL/uL — ABNORMAL LOW (ref 3.87–5.11)
RDW: 13.8 % (ref 11.5–15.5)
WBC: 6.6 10*3/uL (ref 4.0–10.5)
nRBC: 0 % (ref 0.0–0.2)

## 2021-09-22 LAB — URINALYSIS, ROUTINE W REFLEX MICROSCOPIC
Bilirubin Urine: NEGATIVE
Glucose, UA: NEGATIVE mg/dL
Ketones, ur: NEGATIVE mg/dL
Nitrite: NEGATIVE
Protein, ur: NEGATIVE mg/dL
Specific Gravity, Urine: 1.006 (ref 1.005–1.030)
WBC, UA: >50 WBC/hpf — ABNORMAL HIGH (ref 0–5)
pH: 5 (ref 5.0–8.0)

## 2021-09-22 LAB — BASIC METABOLIC PANEL
Anion gap: 9 (ref 5–15)
BUN: 67 mg/dL — ABNORMAL HIGH (ref 8–23)
CO2: 19 mmol/L — ABNORMAL LOW (ref 22–32)
Calcium: 8.4 mg/dL — ABNORMAL LOW (ref 8.9–10.3)
Chloride: 113 mmol/L — ABNORMAL HIGH (ref 98–111)
Creatinine, Ser: 4.95 mg/dL — ABNORMAL HIGH (ref 0.44–1.00)
GFR, Estimated: 8 mL/min — ABNORMAL LOW (ref >60)
Glucose, Bld: 147 mg/dL — ABNORMAL HIGH (ref 70–99)
Potassium: 3.9 mmol/L (ref 3.5–5.1)
Sodium: 141 mmol/L (ref 135–145)

## 2021-09-22 MED ORDER — LACTATED RINGERS IV BOLUS
1000.0000 mL | Freq: Once | INTRAVENOUS | Status: AC
  Administered 2021-09-22: 1000 mL via INTRAVENOUS

## 2021-09-22 MED ORDER — ACETAMINOPHEN 325 MG PO TABS: 650.0000 mg | ORAL_TABLET | Freq: Four times a day (QID) | ORAL | Status: DC | PRN

## 2021-09-22 MED ORDER — ACETAMINOPHEN 650 MG RE SUPP: 650.0000 mg | Freq: Four times a day (QID) | RECTAL | Status: DC | PRN

## 2021-09-22 MED ORDER — HEPARIN SODIUM (PORCINE) 5000 UNIT/ML IJ SOLN
5000.0000 [IU] | Freq: Three times a day (TID) | INTRAMUSCULAR | Status: DC
  Administered 2021-09-22 – 2021-09-24 (×5): 5000 [IU] via SUBCUTANEOUS
  Filled 2021-09-22 (×5): qty 1

## 2021-09-22 NOTE — ED Provider Notes (Signed)
[facility] EMERGENCY DEPARTMENT Provider Note  CSN: [id number] Arrival date & time: 09/22/21 1723  Chief Complaint(s) Abnormal Lab  HPI [name] is a 88 y.o. female with PMH CKD, GERD, HTN, HLD, previous CVA who presents emergency department for evaluation of worsening azotemia.  Patient seen routinely by her nephrologist dr. [name] who found the patient to have severely worsening creatinine from a baseline of 3.1 to 4.8 new anemia.  Given worsening kidney function despite outpatient treatment regimens, she was then sent to the emergency department.  She currently denies chest pain, shortness of breath, abdominal pain, nausea, vomiting or other systemic symptoms.  Per nephrology notes, patient was diagnosed with a UTI and was placed on ciprofloxacin but patient told her nephrologist that she has not been taking this medication.   Past Medical History Past Medical History:  Diagnosis Date   Anxiety    Arthritis    CKD (chronic kidney disease)    Dr. Hinda Lenis   Depression    GERD (gastroesophageal reflux disease)    Hyperlipidemia    Hypertension    Osteoporosis    Stroke [facility])    UTI (lower urinary tract infection)    Patient Active Problem List   Diagnosis Date Noted   Prediabetes 08/29/2020   Fatigue 08/26/2020   Protein-calorie malnutrition ([place]) 04/19/2020   GERD (gastroesophageal reflux disease) 08/12/2019   Bilateral primary osteoarthritis of knee 11/09/2016   History of stroke 06/02/2016   Osteoarthritis 03/25/2014   Anxiety and depression 02/20/2012   Chronic headache 02/20/2012   Peripheral edema 02/19/2012   Hyperparathyroidism ([place]) 10/23/2008   ANEMIA 10/23/2008   Essential hypertension 10/23/2008   GALLSTONE PANCREATITIS 10/23/2008   Chronic kidney disease 10/23/2008   DEGENERATIVE JOINT DISEASE 10/23/2008   PROTEINURIA 10/23/2008   Home Medication(s) Prior to Admission medications   Medication Sig Start Date End Date Taking? Authorizing  Provider  acetaminophen (TYLENOL) 650 MG CR tablet Take 650 mg by mouth every 8 (eight) hours as needed for pain.   Yes [provider]  atorvastatin (LIPITOR) 20 MG tablet Take 1 tablet (20 mg total) by mouth daily. 04/08/21  Yes Eulogio Bear, NP  Cranberry-Vitamin C-Vitamin E (CRANBERRY PLUS VITAMIN C) 4200-20-3 MG-MG-UNIT CAPS Take by mouth.   Yes [provider]  diclofenac Sodium (VOLTAREN) 1 % GEL Apply 2 g topically 4 (four) times daily. 08/26/20  Yes Eulogio Bear, NP  dipyridamole-aspirin (AGGRENOX) 200-25 MG 12hr capsule Take 1 capsule by mouth 2 (two) times daily. 04/08/21  Yes Noemi Chapel A, NP  labetalol (NORMODYNE) 100 MG tablet Take 1 tablet (100 mg total) by mouth 2 (two) times daily. 04/29/21  Yes Eulogio Bear, NP  Omega-3 Fatty Acids (OMEGA-3 FISH OIL PO) Take by mouth.   Yes [provider]  omeprazole (PRILOSEC) 20 MG capsule TAKE 1 CAPSULE(20 MG) BY MOUTH DAILY 04/29/21  Yes Noemi Chapel A, NP  traMADol (ULTRAM) 50 MG tablet Take 1 tablet (50 mg total) by mouth daily as needed. 09/29/15  Yes Delora Fuel, MD  amLODipine (NORVASC) 2.5 MG tablet Take 2.5 mg by mouth daily.    [provider]  calcitRIOL (ROCALTROL) 0.25 MCG capsule Take 0.25 mcg by mouth daily.    [provider]  furosemide (LASIX) 20 MG tablet Take 20 mg by mouth 2 (two) times daily. Patient not taking: Reported on 09/22/2021 06/15/20   [provider]  Past Surgical History Past Surgical History:  Procedure Laterality Date   CHOLECYSTECTOMY     Family History Family History  Problem Relation Age of Onset   Cancer Mother    Cancer Father     Social History Social History   Tobacco Use   Smoking status: Never   Smokeless tobacco: Never  Substance Use Topics   Alcohol use: No   Drug use: No    Allergies Ciprofloxacin, Diphenhydramine hcl, and Sulfonamide derivatives  Review of Systems Review of Systems  All other systems reviewed and are negative.   Physical Exam Vital Signs  I have reviewed the triage vital signs BP 133/65   Pulse 70   Temp 98.2 F (36.8 C) (Oral)   Resp 18   Ht '5\' 2"'$  (1.575 m)   Wt 59 kg   SpO2 98%   BMI 23.78 kg/m   Physical Exam Vitals and nursing note reviewed.  Constitutional:      General: She is not in acute distress.    Appearance: She is well-developed.  HENT:     Head: Normocephalic and atraumatic.  Eyes:     Conjunctiva/sclera: Conjunctivae normal.  Cardiovascular:     Rate and Rhythm: Normal rate and regular rhythm.     Heart sounds: No murmur heard. Pulmonary:     Effort: Pulmonary effort is normal. No respiratory distress.     Breath sounds: Normal breath sounds.  Abdominal:     Palpations: Abdomen is soft.     Tenderness: There is no abdominal tenderness.  Musculoskeletal:        General: No swelling.     Cervical back: Neck supple.  Skin:    General: Skin is warm and dry.     Capillary Refill: Capillary refill takes less than 2 seconds.  Neurological:     Mental Status: She is alert.  Psychiatric:        Mood and Affect: Mood normal.     ED Results and Treatments Labs (all labs ordered are listed, but only abnormal results are displayed) Labs Reviewed  CBC WITH DIFFERENTIAL/PLATELET - Abnormal; Notable for the following components:      Result Value   RBC 3.45 (*)    Hemoglobin 11.2 (*)    MCV 105.5 (*)    All other components within normal limits  BASIC METABOLIC PANEL - Abnormal; Notable for the following components:   Chloride 113 (*)    CO2 19 (*)    Glucose, Bld 147 (*)    BUN 67 (*)    Creatinine, Ser 4.95 (*)    Calcium 8.4 (*)    GFR, Estimated 8 (*)    All other components within normal limits                                                                                                                           Radiology No results found.  Pertinent labs & imaging results that were available during my  care of the patient were reviewed by me and considered in my medical decision making (see MDM for details).  Medications Ordered in ED Medications  lactated ringers bolus 1,000 mL (has no administration in time range)                                                                                                                                     Procedures .Critical Care  Performed by: Teressa Lower, MD Authorized by: Teressa Lower, MD   Critical care provider statement:    Critical care time (minutes):  30   Critical care was necessary to treat or prevent imminent or life-threatening deterioration of the following conditions:  Dehydration and renal failure   Critical care was time spent personally by me on the following activities:  Development of treatment plan with patient or surrogate, discussions with consultants, evaluation of patient's response to treatment, examination of patient, ordering and review of laboratory studies, ordering and review of radiographic studies, ordering and performing treatments and interventions, pulse oximetry, re-evaluation of patient's condition and review of old charts   (including critical care time)  Medical Decision Making / ED Course   This patient presents to the ED for concern of azotemia, this involves an extensive number of treatment options, and is a complaint that carries with it a high risk of complications and morbidity.  The differential diagnosis includes medical renal disease, prerenal AKI, obstructive uropathy  MDM: Patient seen in the emergency department for evaluation of worsening kidney function.  Physical exam is unremarkable.  Laboratory evaluation with a rising BUN to 67, creatinine 4.95, anemia to 11.2 with an MCV of 105.5.  Fluid resuscitation begun with 1 L lactated Ringer's and a renal ultrasound was  ordered.  Given previous history of urinary tract infection not treated with a prescribed fluoroquinolone, urinalysis was ordered and is pending.  Renal ultrasound not available until tomorrow morning.  CT stone study with no obstructive uropathy or hydronephrosis.  Given worsening renal function, patient will require admission and patient mid to the hospital service.   Additional history obtained: -Additional history obtained from multiple family members -External records from outside source obtained and reviewed including: Chart review including previous notes, labs, imaging, consultation notes   Lab Tests: -I ordered, reviewed, and interpreted labs.   The pertinent results include:   Labs Reviewed  CBC WITH DIFFERENTIAL/PLATELET - Abnormal; Notable for the following components:      Result Value   RBC 3.45 (*)    Hemoglobin 11.2 (*)    MCV 105.5 (*)    All other components within normal limits  BASIC METABOLIC PANEL - Abnormal; Notable for the following components:   Chloride 113 (*)    CO2 19 (*)    Glucose, Bld 147 (*)    BUN 67 (*)    Creatinine, Ser 4.95 (*)    Calcium 8.4 (*)    GFR, Estimated  8 (*)    All other components within normal limits     Imaging Studies ordered: I ordered imaging studies including CT stone study I independently visualized and interpreted imaging. I agree with the radiologist interpretation   Medicines ordered and prescription drug management: Meds ordered this encounter  Medications   lactated ringers bolus 1,000 mL    -I have reviewed the patients home medicines and have made adjustments as needed  Critical interventions Fluid resuscitation  Cardiac Monitoring: The patient was maintained on a cardiac monitor.  I personally viewed and interpreted the cardiac monitored which showed an underlying rhythm of: NSR  Social Determinants of Health:  Factors impacting patients care include: None   Reevaluation: After the interventions  noted above, I reevaluated the patient and found that they have :stayed the same  Co morbidities that complicate the patient evaluation  Past Medical History:  Diagnosis Date   Anxiety    Arthritis    CKD (chronic kidney disease)    Dr. Hinda Lenis   Depression    GERD (gastroesophageal reflux disease)    Hyperlipidemia    Hypertension    Osteoporosis    Stroke [facility])    UTI (lower urinary tract infection)       Dispostion: I considered admission for this patient, and given worsening renal function, patient will require hospital admission     Final Clinical Impression(s) / ED Diagnoses Final diagnoses:  None     '@PCDICTATION'$ @    Teressa Lower, MD 09/23/21 1236

## 2021-09-22 NOTE — H&P (Signed)
History and Physical    Patient: [name] PQZ:[id number] DOB: March 05, 1931 DOA: 09/22/2021 DOS: the patient was seen and examined on 09/22/2021 PCP: [name], MD  Patient coming from: Home  Chief Complaint:  Chief Complaint  Patient presents with   Abnormal Lab   HPI: [name] is a 88 y.o. female with medical history significant of CKD stage IV, hyperlipidemia, GERD, hypertension who presents to the emergency department due to elevated BUN/creatinine.  Patient had a normal blood work with PCP where she was suspected to have a UTI (though patient complained of no irritative bladder symptoms), ciprofloxacin was given, but she has not taken the medication.  PCP was concerned due to worsening kidney function, so the report was sent to her nephrologist ( Dr. [name]) who asked patient to go to the ED for further evaluation and management.  Family at bedside states that patient has had decreased oral intake within the last 2 to 3 weeks. She denies chest pain, shortness of breath, fever, chills, nausea, vomiting, abdominal pain  ED Course:  In the emergency department, hemodynamically stable.  Work-up in the ED showed macrocytic anemia, BMP showed sodium 141, potassium 3.9, chloride 113, bicarb 19, glucose 147, BUN/creatinine 6 7/4.95 (baseline creatinine at 2.9-3.5).  eGFR 8 (this was 13 on 07/29/2021). CT abdomen and pelvis without contrast showed no acute intra-abdominal pathology IV hydration of 1 L LR was given in the ED.  Hospitalist was asked to admit patient for further evaluation and management.  Review of Systems: Review of systems as noted in the HPI. All other systems reviewed and are negative.   Past Medical History:  Diagnosis Date   Anxiety    Arthritis    CKD (chronic kidney disease)    Dr. Hinda Lenis   Depression    GERD (gastroesophageal reflux disease)    Hyperlipidemia    Hypertension    Osteoporosis    Stroke [facility])    UTI (lower urinary tract  infection)    Past Surgical History:  Procedure Laterality Date   CHOLECYSTECTOMY      Social History:  reports that she has never smoked. She has never used smokeless tobacco. She reports that she does not drink alcohol and does not use drugs.   Allergies  Allergen Reactions   Ciprofloxacin     Acute renal failure   Diphenhydramine Hcl     REACTION: UNKNOWN REACTION   Sulfonamide Derivatives Rash    Family History  Problem Relation Age of Onset   Cancer Mother    Cancer Father      Prior to Admission medications   Medication Sig Start Date End Date Taking? Authorizing Provider  acetaminophen (TYLENOL) 650 MG CR tablet Take 650 mg by mouth every 8 (eight) hours as needed for pain.   Yes [provider]  atorvastatin (LIPITOR) 20 MG tablet Take 1 tablet (20 mg total) by mouth daily. 04/08/21  Yes Eulogio Bear, NP  Cranberry-Vitamin C-Vitamin E (CRANBERRY PLUS VITAMIN C) 4200-20-3 MG-MG-UNIT CAPS Take by mouth.   Yes [provider]  diclofenac Sodium (VOLTAREN) 1 % GEL Apply 2 g topically 4 (four) times daily. 08/26/20  Yes Eulogio Bear, NP  dipyridamole-aspirin (AGGRENOX) 200-25 MG 12hr capsule Take 1 capsule by mouth 2 (two) times daily. 04/08/21  Yes Noemi Chapel A, NP  labetalol (NORMODYNE) 100 MG tablet Take 1 tablet (100 mg total) by mouth 2 (two) times daily. 04/29/21  Yes Eulogio Bear, NP  Omega-3 Fatty  Acids (OMEGA-3 FISH OIL PO) Take by mouth.   Yes [provider]  omeprazole (PRILOSEC) 20 MG capsule TAKE 1 CAPSULE(20 MG) BY MOUTH DAILY 04/29/21  Yes Noemi Chapel A, NP  traMADol (ULTRAM) 50 MG tablet Take 1 tablet (50 mg total) by mouth daily as needed. 05/26/33  Yes Delora Fuel, MD  amLODipine (NORVASC) 2.5 MG tablet Take 2.5 mg by mouth daily.    [provider]  calcitRIOL (ROCALTROL) 0.25 MCG capsule Take 0.25 mcg by mouth daily.    [provider]  furosemide (LASIX) 20 MG tablet Take 20 mg by  mouth 2 (two) times daily. Patient not taking: Reported on 09/22/2021 06/15/20   [provider]    Physical Exam: BP (!) 119/59 (BP Location: Left Arm)   Pulse 72   Temp 98.3 F (36.8 C) (Oral)   Resp 17   Ht 5' 2" (1.575 m)   Wt 59.8 kg   SpO2 100%   BMI 24.11 kg/m   General: 88 y.o. year-old female well developed well nourished in no acute distress.  Alert and oriented x3. HEENT: NCAT, EOMI, dry mucous membrane Neck: Supple, trachea medial Cardiovascular: Regular rate and rhythm with no rubs or gallops.  No thyromegaly or JVD noted.  No lower extremity edema. 2/4 pulses in all 4 extremities. Respiratory: Clear to auscultation with no wheezes or rales. Good inspiratory effort. Abdomen: Soft, nontender nondistended with normal bowel sounds x4 quadrants. Muskuloskeletal: No cyanosis, clubbing or edema noted bilaterally Neuro: CN II-XII intact, strength 5/5 x 4, sensation, reflexes intact Skin: No ulcerative lesions noted or rashes Psychiatry: Judgement and insight appear normal. Mood is appropriate for condition and setting          Labs on Admission:  Basic Metabolic Panel: Recent Labs  Lab 09/22/21 1753  NA 141  K 3.9  CL 113*  CO2 19*  GLUCOSE 147*  BUN 67*  CREATININE 4.95*  CALCIUM 8.4*   Liver Function Tests: No results for input(s): "AST", "ALT", "ALKPHOS", "BILITOT", "PROT", "ALBUMIN" in the last 168 hours. No results for input(s): "LIPASE", "AMYLASE" in the last 168 hours. No results for input(s): "AMMONIA" in the last 168 hours. CBC: Recent Labs  Lab 09/22/21 1753  WBC 6.6  NEUTROABS 4.0  HGB 11.2*  HCT 36.4  MCV 105.5*  PLT 169   Cardiac Enzymes: No results for input(s): "CKTOTAL", "CKMB", "CKMBINDEX", "TROPONINI" in the last 168 hours.  BNP (last 3 results) No results for input(s): "BNP" in the last 8760 hours.  ProBNP (last 3 results) No results for input(s): "PROBNP" in the last 8760 hours.  CBG: No results for input(s):  "GLUCAP" in the last 168 hours.  Radiological Exams on Admission: CT Renal Stone Study  Result Date: 09/22/2021 CLINICAL DATA:  Nephrolithiasis, acute renal failure EXAM: CT ABDOMEN AND PELVIS WITHOUT CONTRAST TECHNIQUE: Multidetector CT imaging of the abdomen and pelvis was performed following the standard protocol without IV contrast. RADIATION DOSE REDUCTION: This exam was performed according to the departmental dose-optimization program which includes automated exposure control, adjustment of the mA and/or kV according to patient size and/or use of iterative reconstruction technique. COMPARISON:  None Available. FINDINGS: Lower chest: Bibasilar atelectasis. Ascending thoracic aortic aneurysm is incompletely visualized, better seen on CT examination of 07/29/2021. Moderate coronary artery calcification. Global cardiac size within normal limits. Hepatobiliary: No focal liver abnormality is seen. Status post cholecystectomy. No biliary dilatation. Pancreas: Unremarkable Spleen: Unremarkable Adrenals/Urinary Tract: Adrenal glands are unremarkable. Kidneys are normal, without renal  calculi, focal lesion, or hydronephrosis. Bladder is unremarkable. Stomach/Bowel: Moderate to severe pancolonic diverticulosis, most severe within the sigmoid colon without superimposed acute inflammatory change. Stomach, small bowel, and large bowel are otherwise unremarkable. Appendix normal. No free intraperitoneal gas or fluid. Vascular/Lymphatic: Extensive aortoiliac atherosclerotic calcification. No abdominal aortic aneurysm. No pathologic adenopathy within the abdomen and pelvis. Reproductive: Uterus and bilateral adnexa are unremarkable. Other: Tiny fat containing umbilical hernia. Musculoskeletal: Osseous structures are diffusely osteopenic. Degenerative changes seen throughout the visualized thoracolumbar spine. No acute bone abnormality. IMPRESSION: 1. No acute intra-abdominal pathology identified. No definite radiographic  explanation for the patient's reported symptoms. Normal noncontrast examination of the kidneys and renal collecting system. 2. Moderate to severe pancolonic diverticulosis without superimposed acute inflammatory change. Electronically Signed   By: Fidela Salisbury [name]   On: 09/22/2021 23:20    EKG: I independently viewed the EKG done and my findings are as followed: EKG was not done in the ED  Assessment/Plan Present on Admission:  Acute kidney injury superimposed on chronic kidney disease ([place])  Principal Problem:   Acute kidney injury superimposed on chronic kidney disease ([place])  Acute kidney injury superimposed on chronic kidney disease stage IV Dehydration BUN/creatinine 6 7/4.95 (baseline creatinine at 2.9-3.5). Continue IV hydration Renally adjust medications, avoid nephrotoxic agents/dehydration/hypotension Nephrology will be consulted and we shall await further recommendation  Macrocytic anemia H/H 11.2/36.4, MCV 105.5 Folate and vitamin B12 levels will be checked  GERD Continue Protonix  Essential hypertension (controlled) Continue labetalol  Mixed hyperlipidemia Continue Lipitor and omega-3   DVT prophylaxis: Heparin subcu  Code Status: Full code  Consults: Nephrology  Family Communication: Family at bedside (all questions answered to satisfaction)  Severity of Illness: The appropriate patient status for this patient is OBSERVATION. Observation status is judged to be reasonable and necessary in order to provide the required intensity of service to ensure the patient's safety. The patient's presenting symptoms, physical exam findings, and initial radiographic and laboratory data in the context of their medical condition is felt to place them at decreased risk for further clinical deterioration. Furthermore, it is anticipated that the patient will be medically stable for discharge from the hospital within 2 midnights of admission.   Author: Bernadette Hoit,  DO 09/22/2021 8:34 PM  For on call review [email or web address].

## 2021-09-22 NOTE — ED Triage Notes (Signed)
Pt sent here due to elevated kidney function on her labs. Denies any pain currently.

## 2021-09-23 ENCOUNTER — Observation Stay (HOSPITAL_COMMUNITY): Payer: Medicare HMO

## 2021-09-23 DIAGNOSIS — E782 Mixed hyperlipidemia: Secondary | ICD-10-CM | POA: Diagnosis not present

## 2021-09-23 DIAGNOSIS — N184 Chronic kidney disease, stage 4 (severe): Secondary | ICD-10-CM | POA: Diagnosis not present

## 2021-09-23 DIAGNOSIS — R944 Abnormal results of kidney function studies: Secondary | ICD-10-CM | POA: Diagnosis not present

## 2021-09-23 DIAGNOSIS — Z9049 Acquired absence of other specified parts of digestive tract: Secondary | ICD-10-CM | POA: Diagnosis not present

## 2021-09-23 DIAGNOSIS — Z882 Allergy status to sulfonamides status: Secondary | ICD-10-CM | POA: Diagnosis not present

## 2021-09-23 DIAGNOSIS — K219 Gastro-esophageal reflux disease without esophagitis: Secondary | ICD-10-CM | POA: Diagnosis not present

## 2021-09-23 DIAGNOSIS — I120 Hypertensive chronic kidney disease with stage 5 chronic kidney disease or end stage renal disease: Secondary | ICD-10-CM | POA: Diagnosis not present

## 2021-09-23 DIAGNOSIS — N2889 Other specified disorders of kidney and ureter: Secondary | ICD-10-CM | POA: Diagnosis not present

## 2021-09-23 DIAGNOSIS — E869 Volume depletion, unspecified: Secondary | ICD-10-CM | POA: Diagnosis not present

## 2021-09-23 DIAGNOSIS — N271 Small kidney, bilateral: Secondary | ICD-10-CM | POA: Diagnosis not present

## 2021-09-23 DIAGNOSIS — N179 Acute kidney failure, unspecified: Secondary | ICD-10-CM | POA: Diagnosis present

## 2021-09-23 DIAGNOSIS — F32A Depression, unspecified: Secondary | ICD-10-CM | POA: Diagnosis present

## 2021-09-23 DIAGNOSIS — N390 Urinary tract infection, site not specified: Secondary | ICD-10-CM | POA: Diagnosis not present

## 2021-09-23 DIAGNOSIS — M810 Age-related osteoporosis without current pathological fracture: Secondary | ICD-10-CM | POA: Diagnosis not present

## 2021-09-23 DIAGNOSIS — E860 Dehydration: Secondary | ICD-10-CM | POA: Diagnosis not present

## 2021-09-23 DIAGNOSIS — R69 Illness, unspecified: Secondary | ICD-10-CM | POA: Diagnosis not present

## 2021-09-23 DIAGNOSIS — D649 Anemia, unspecified: Secondary | ICD-10-CM | POA: Diagnosis not present

## 2021-09-23 DIAGNOSIS — Z881 Allergy status to other antibiotic agents status: Secondary | ICD-10-CM | POA: Diagnosis not present

## 2021-09-23 DIAGNOSIS — F419 Anxiety disorder, unspecified: Secondary | ICD-10-CM | POA: Diagnosis not present

## 2021-09-23 DIAGNOSIS — I10 Essential (primary) hypertension: Secondary | ICD-10-CM | POA: Diagnosis not present

## 2021-09-23 DIAGNOSIS — N3000 Acute cystitis without hematuria: Secondary | ICD-10-CM | POA: Diagnosis not present

## 2021-09-23 DIAGNOSIS — N185 Chronic kidney disease, stage 5: Secondary | ICD-10-CM | POA: Diagnosis not present

## 2021-09-23 DIAGNOSIS — D539 Nutritional anemia, unspecified: Secondary | ICD-10-CM | POA: Diagnosis not present

## 2021-09-23 DIAGNOSIS — Z79899 Other long term (current) drug therapy: Secondary | ICD-10-CM | POA: Diagnosis not present

## 2021-09-23 DIAGNOSIS — R7989 Other specified abnormal findings of blood chemistry: Secondary | ICD-10-CM | POA: Diagnosis not present

## 2021-09-23 DIAGNOSIS — Z8673 Personal history of transient ischemic attack (TIA), and cerebral infarction without residual deficits: Secondary | ICD-10-CM | POA: Diagnosis not present

## 2021-09-23 DIAGNOSIS — I129 Hypertensive chronic kidney disease with stage 1 through stage 4 chronic kidney disease, or unspecified chronic kidney disease: Secondary | ICD-10-CM | POA: Diagnosis not present

## 2021-09-23 DIAGNOSIS — Z888 Allergy status to other drugs, medicaments and biological substances status: Secondary | ICD-10-CM | POA: Diagnosis not present

## 2021-09-23 LAB — CBC
HCT: 33.5 % — ABNORMAL LOW (ref 36.0–46.0)
Hemoglobin: 10.6 g/dL — ABNORMAL LOW (ref 12.0–15.0)
MCH: 33.2 pg (ref 26.0–34.0)
MCHC: 31.6 g/dL (ref 30.0–36.0)
MCV: 105 fL — ABNORMAL HIGH (ref 80.0–100.0)
Platelets: 157 10*3/uL (ref 150–400)
RBC: 3.19 MIL/uL — ABNORMAL LOW (ref 3.87–5.11)
RDW: 13.9 % (ref 11.5–15.5)
WBC: 5.8 10*3/uL (ref 4.0–10.5)
nRBC: 0 % (ref 0.0–0.2)

## 2021-09-23 LAB — COMPREHENSIVE METABOLIC PANEL
ALT: 17 U/L (ref 0–44)
AST: 22 U/L (ref 15–41)
Albumin: 3.2 g/dL — ABNORMAL LOW (ref 3.5–5.0)
Alkaline Phosphatase: 56 U/L (ref 38–126)
Anion gap: 9 (ref 5–15)
BUN: 62 mg/dL — ABNORMAL HIGH (ref 8–23)
CO2: 21 mmol/L — ABNORMAL LOW (ref 22–32)
Calcium: 8.2 mg/dL — ABNORMAL LOW (ref 8.9–10.3)
Chloride: 113 mmol/L — ABNORMAL HIGH (ref 98–111)
Creatinine, Ser: 4.22 mg/dL — ABNORMAL HIGH (ref 0.44–1.00)
GFR, Estimated: 10 mL/min — ABNORMAL LOW (ref >60)
Glucose, Bld: 87 mg/dL (ref 70–99)
Potassium: 3.2 mmol/L — ABNORMAL LOW (ref 3.5–5.1)
Sodium: 143 mmol/L (ref 135–145)
Total Bilirubin: 0.7 mg/dL (ref 0.3–1.2)
Total Protein: 6.4 g/dL — ABNORMAL LOW (ref 6.5–8.1)

## 2021-09-23 LAB — APTT: aPTT: 28 seconds (ref 24–36)

## 2021-09-23 LAB — MAGNESIUM: Magnesium: 1.6 mg/dL — ABNORMAL LOW (ref 1.7–2.4)

## 2021-09-23 LAB — VITAMIN B12: Vitamin B-12: 446 pg/mL (ref 180–914)

## 2021-09-23 LAB — FOLATE: Folate: 21.4 ng/mL (ref >5.9)

## 2021-09-23 LAB — PHOSPHORUS: Phosphorus: 2.7 mg/dL (ref 2.5–4.6)

## 2021-09-23 MED ORDER — ATORVASTATIN CALCIUM 20 MG PO TABS
20.0000 mg | ORAL_TABLET | Freq: Every day | ORAL | Status: DC
  Administered 2021-09-23 – 2021-09-24 (×2): 20 mg via ORAL
  Filled 2021-09-23 (×2): qty 1

## 2021-09-23 MED ORDER — POTASSIUM CHLORIDE CRYS ER 20 MEQ PO TBCR
20.0000 meq | EXTENDED_RELEASE_TABLET | Freq: Once | ORAL | Status: AC
  Administered 2021-09-23: 20 meq via ORAL
  Filled 2021-09-23: qty 1

## 2021-09-23 MED ORDER — SODIUM CHLORIDE 0.9 % IV SOLN
1.0000 g | INTRAVENOUS | Status: DC
  Administered 2021-09-23: 1 g via INTRAVENOUS
  Filled 2021-09-23: qty 10

## 2021-09-23 MED ORDER — LABETALOL HCL 200 MG PO TABS
100.0000 mg | ORAL_TABLET | Freq: Two times a day (BID) | ORAL | Status: DC
  Administered 2021-09-23 – 2021-09-24 (×3): 100 mg via ORAL
  Filled 2021-09-23 (×3): qty 1

## 2021-09-23 MED ORDER — OMEGA-3-ACID ETHYL ESTERS 1 G PO CAPS
1.0000 g | ORAL_CAPSULE | Freq: Every day | ORAL | Status: DC
  Administered 2021-09-23 – 2021-09-24 (×2): 1 g via ORAL
  Filled 2021-09-23 (×2): qty 1

## 2021-09-23 MED ORDER — SODIUM CHLORIDE 0.9 % IV SOLN: INTRAVENOUS | Status: AC

## 2021-09-23 MED ORDER — PANTOPRAZOLE SODIUM 40 MG PO TBEC
40.0000 mg | DELAYED_RELEASE_TABLET | Freq: Every day | ORAL | Status: DC
  Administered 2021-09-23 – 2021-09-24 (×2): 40 mg via ORAL
  Filled 2021-09-23 (×2): qty 1

## 2021-09-23 MED ORDER — OMEGA-3 FISH OIL 1000 MG PO CAPS: 1.0000 | ORAL_CAPSULE | Freq: Every day | ORAL | Status: DC

## 2021-09-23 MED ORDER — SODIUM CHLORIDE 0.9 % IV SOLN: INTRAVENOUS | Status: DC

## 2021-09-23 MED ORDER — MAGNESIUM SULFATE IN D5W 1-5 GM/100ML-% IV SOLN
1.0000 g | Freq: Once | INTRAVENOUS | Status: AC
  Administered 2021-09-23: 1 g via INTRAVENOUS
  Filled 2021-09-23: qty 100

## 2021-09-23 NOTE — Evaluation (Signed)
Physical Therapy Evaluation Patient Details Name: [name] MRN: [id number] DOB: [birth year] Today's Date: 09/23/2021  History of Present Illness  [name] is a 88 y.o. female with medical history significant of CKD stage IV, hyperlipidemia, GERD, hypertension who presents to the emergency department due to elevated BUN/creatinine.  Patient had a normal blood work with PCP where she was suspected to have a UTI (though patient complained of no irritative bladder symptoms), ciprofloxacin was given, but she has not taken the medication.  PCP was concerned due to worsening kidney function, so the report was sent to her nephrologist ( Dr. [name]) who asked patient to go to the ED for further evaluation and management.  Family at bedside states that patient has had decreased oral intake within the last 2 to 3 weeks. She denies chest pain, shortness of breath, fever, chills, nausea, vomiting, abdominal pain   Clinical Impression  Patient limited for functional mobility as stated below secondary to BLE weakness, fatigue and impaired balance. Patient does not require assist with bed mobility to transition to seated EOB. She demonstrates good sitting tolerance and sitting balance at EOB. Patient transfers to standing with use of SPC is slightly unsteady and apprehensive with standing/ambulating. Performed again with use of RW and patient demonstrates improved balance. Patient ambulates with use of RW without loss of balance with some minor cueing for proper RW use. Patient will benefit from continued physical therapy in hospital and recommended venue below to increase strength, balance, endurance for safe ADLs and gait.        Recommendations for follow up therapy are one component of a multi-disciplinary discharge planning process, led by the attending physician.  Recommendations may be updated based on patient status, additional functional criteria and insurance authorization.  Follow Up  Recommendations Home health PT      Assistance Recommended at Discharge Intermittent Supervision/Assistance  Patient can return home with the following  A little help with walking and/or transfers;A little help with bathing/dressing/bathroom;Assistance with cooking/housework;Assist for transportation;Help with stairs or ramp for entrance    Equipment Recommendations None recommended by PT  Recommendations for Other Services       Functional Status Assessment Patient has had a recent decline in their functional status and demonstrates the ability to make significant improvements in function in a reasonable and predictable amount of time.     Precautions / Restrictions Precautions Precautions: Fall Restrictions Weight Bearing Restrictions: No      Mobility  Bed Mobility Overal bed mobility: Modified Independent             General bed mobility comments: slow, labored    Transfers Overall transfer level: Needs assistance Equipment used: Straight cane, Rolling walker (2 wheels) Transfers: Sit to/from Stand Sit to Stand: Min guard, Min assist           General transfer comment: assist to power up to standing, completed 1x with SPC and 1x with RW, improved balance with use of RW upon standing    Ambulation/Gait Ambulation/Gait assistance: Min guard Gait Distance (Feet): 70 Feet Assistive device: Rolling walker (2 wheels) Gait Pattern/deviations: Trunk flexed, Step-through pattern, Decreased stride length Gait velocity: decreased     General Gait Details: slow cadence with RW, no loss of balance, cueing for proper RW use  Stairs            Wheelchair Mobility    Modified Rankin (Stroke Patients Only)       Balance Overall balance assessment: Needs assistance Sitting-balance  support: No upper extremity supported, Feet supported Sitting balance-Leahy Scale: Good Sitting balance - Comments: seated EOB   Standing balance support: Bilateral upper  extremity supported Standing balance-Leahy Scale: Fair Standing balance comment: fair/good with RW                             Pertinent Vitals/Pain Pain Assessment Pain Assessment: No/denies pain    Home Living Family/patient expects to be discharged to:: Private residence Living Arrangements: Children Available Help at Discharge: Family Type of Home: House Home Access: Level entry       Home Layout: One level Home Equipment: Cane - single Barista (2 wheels) Additional Comments: will be moving to a newly built house in a few weeks    Prior Function Prior Level of Function : Needs assist             Mobility Comments: household ambulation with SPC ADLs Comments: family assist PRN, has been doing less lately around the house as family has just been doing more     Hand Dominance        Extremity/Trunk Assessment   Upper Extremity Assessment Upper Extremity Assessment: Generalized weakness    Lower Extremity Assessment Lower Extremity Assessment: Generalized weakness    Cervical / Trunk Assessment Cervical / Trunk Assessment: Kyphotic  Communication   Communication: No difficulties  Cognition Arousal/Alertness: Awake/alert Behavior During Therapy: WFL for tasks assessed/performed Overall Cognitive Status: Within Functional Limits for tasks assessed                                          General Comments      Exercises     Assessment/Plan    PT Assessment Patient needs continued PT services  PT Problem List Decreased strength;Decreased mobility;Decreased activity tolerance;Decreased balance;Decreased knowledge of use of DME       PT Treatment Interventions DME instruction;Therapeutic activities;Gait training;Therapeutic exercise;Patient/family education;Stair training;Balance training;Functional mobility training;Neuromuscular re-education    PT Goals (Current goals can be found in the Care Plan section)   Acute Rehab PT Goals Patient Stated Goal: Return home PT Goal Formulation: With patient Time For Goal Achievement: 10/07/21 Potential to Achieve Goals: Good    Frequency Min 3X/week     Co-evaluation               AM-PAC PT "6 Clicks" Mobility  Outcome Measure Help needed turning from your back to your side while in a flat bed without using bedrails?: None Help needed moving from lying on your back to sitting on the side of a flat bed without using bedrails?: A Little Help needed moving to and from a bed to a chair (including a wheelchair)?: A Little Help needed standing up from a chair using your arms (e.g., wheelchair or bedside chair)?: A Little Help needed to walk in hospital room?: A Little Help needed climbing 3-5 steps with a railing? : A Lot 6 Click Score: 18    End of Session Equipment Utilized During Treatment: Gait belt Activity Tolerance: Patient tolerated treatment well Patient left: in bed;with nursing/sitter in room;with family/visitor present;with call bell/phone within reach Nurse Communication: Mobility status PT Visit Diagnosis: Unsteadiness on feet (R26.81);Other abnormalities of gait and mobility (R26.89);Muscle weakness (generalized) (M62.81)    Time: 1093-2355 PT Time Calculation (min) (ACUTE ONLY): 22 min   Charges:   PT  Evaluation $PT Eval Low Complexity: 1 Low PT Treatments $Therapeutic Activity: 8-22 mins        10:52 AM, 09/23/21 Mearl Latin PT, DPT Physical Therapist at [facility]

## 2021-09-23 NOTE — Plan of Care (Signed)
  Problem: Acute Rehab PT Goals(only PT should resolve) Goal: Patient Will Transfer Sit To/From Stand Outcome: Progressing Flowsheets (Taken 09/23/2021 1053) Patient will transfer sit to/from stand: with supervision Goal: Pt Will Transfer Bed To Chair/Chair To Bed Outcome: Progressing Flowsheets (Taken 09/23/2021 1053) Pt will Transfer Bed to Chair/Chair to Bed: with supervision Goal: Pt Will Ambulate Outcome: Progressing Flowsheets (Taken 09/23/2021 1053) Pt will Ambulate:  100 feet  with least restrictive assistive device  with supervision  with min guard assist Goal: Pt/caregiver will Perform Home Exercise Program Outcome: Progressing Flowsheets (Taken 09/23/2021 1053) Pt/caregiver will Perform Home Exercise Program:  For increased strengthening  For improved balance  Independently  10:54 AM, 09/23/21 Mearl Latin PT, DPT Physical Therapist at [facility]

## 2021-09-23 NOTE — Hospital Course (Signed)
88 year old female with history of anxiety, CKD stage IV, hypertension, hyperlipidemia, stroke presenting with elevated serum creatinine.  The patient states that she had a office visit with her PCP on 09/19/2021.  She had been feeling fatigued and malaised for the better part of the last 2 weeks with decreased oral intake.  Apparently, UA was obtained and suggested UTI.  The patient was prescribed ciprofloxacin which she has not taken it.  Nevertheless, routine blood work was obtained and showed the patient had an elevated serum creatinine.  She went to see her nephrologist [name]) on 09/22/2021 who repeated blood work and noted that her serum creatinine remained elevated.  As result, the patient was sent to the emergency department for further evaluation and treatment.  Notably, the patient's daughter at the bedside who supplements the history states that the patient recently had her furosemide decreased to 20 mg twice daily (from 40 mg am, 20 pm) on 09/12/2021 because of elevation in her serum creatinine.  The patient denies any fevers, chills, chest pain, shortness breath, abdominal pain, nausea, vomiting, diarrhea.  She denies any NSAIDs or other over-the-counter supplements.  She has had decreased oral intake for the better part of the last 2 to 3 weeks. In the ED, the patient was afebrile and hemodynamically stable.  Oxygen saturation was 100% room air.  BMP showed sodium 141, potassium 3.9, serum creatinine 4.95.  WBC 6.6, hemoglobin 1.2, platelets 169,000.  UA showed >50 WBC.  The patient was started on IV fluids.

## 2021-09-23 NOTE — Assessment & Plan Note (Signed)
Continue PPI ?

## 2021-09-23 NOTE — Assessment & Plan Note (Signed)
Continue labetalol Hold amlodipine

## 2021-09-23 NOTE — Consult Note (Signed)
[facility] Renal Consultation Note  Requesting MD: Tat Indication for Consultation: A on CRF  HPI:  [name] is a 88 y.o. female with past medical history significant for hypertension, hyperlipidemia, arthritis, also appears to have had severe acute kidney injury back in 2008 and with CKD ever since that has been progressive in nature.  She follows with Dr. [name].  Last creatinine in outpatient setting was 3.5 indicating a GFR in the low teens.  It does not say specifically in Dr. Toya Smothers notes that dialysis has been discussed.  Patient says that it has been discussed and she has declined the dialysis option which is a reasonable decision for her.  Apparently, patient had labs done elsewhere and creatinine was noted to be above baseline.  Dr. [name] was contacted and advised patient to go to the emergency department.  I see [name] this morning.  She states that she feels better-was having some aches and pains that appear to be better.  The only intervention was that she received some IV fluids-was not necessarily hypotensive.  BUN and creatinine this morning are 62 and 4.22, yesterday was 67 and 4.95 so some improvement.  Patient ate about half of her breakfast.  She is noted to have some difficulty articulating concerns for what she understands about her medical condition.  Yolanda Bonine is at the bedside.  When patient said she was not considering dialysis the grandson indicates that the family is aware of this decision  Creat  Date/Time Value Ref Range Status  08/26/2020 11:30 AM 2.91 (H) 0.60 - 0.88 mg/dL Final    Comment:    For patients >40 years of age, the reference limit for Creatinine is approximately 13% higher for people identified as African-American. .   08/12/2019 02:49 PM 1.80 (H) 0.60 - 0.88 mg/dL Final    Comment:    For patients >80 years of age, the reference limit for Creatinine is approximately 13% higher for people identified as  African-American. Marland Kitchen   03/29/2018 12:31 PM 1.59 (H) 0.60 - 0.88 mg/dL Final    Comment:    For patients >109 years of age, the reference limit for Creatinine is approximately 13% higher for people identified as African-American. .   03/09/2017 12:31 PM 1.81 (H) 0.60 - 0.88 mg/dL Final    Comment:    For patients >11 years of age, the reference limit for Creatinine is approximately 13% higher for people identified as African-American. Marland Kitchen   10/25/2016 11:49 AM 1.83 (H) 0.60 - 0.88 mg/dL Final    Comment:      For patients > or = 86 years of age: The upper reference limit for Creatinine is approximately 13% higher for people identified as African-American.     02/02/2016 12:38 PM 1.67 (H) 0.60 - 0.88 mg/dL Final    Comment:      For patients > or = 86 years of age: The upper reference limit for Creatinine is approximately 13% higher for people identified as African-American.     06/11/2015 10:39 AM 1.57 (H) 0.60 - 0.88 mg/dL Final  06/01/2015 10:11 AM 1.64 (H) 0.60 - 0.88 mg/dL Final  12/22/2014 03:41 PM 1.81 (H) 0.60 - 0.88 mg/dL Final  07/23/2014 10:04 AM 1.52 (H) 0.50 - 1.10 mg/dL Final  01/09/2014 11:51 AM 1.72 (H) 0.50 - 1.10 mg/dL Final  06/16/2013 03:43 PM 1.39 (H) 0.50 - 1.10 mg/dL Final  01/08/2013 09:42 AM 1.38 (H) 0.50 - 1.10 mg/dL Final  03/05/2012 10:35 AM 1.20 (H)  0.50 - 1.10 mg/dL Final   Creatinine, Ser  Date/Time Value Ref Range Status  09/23/2021 04:04 AM 4.22 (H) 0.44 - 1.00 mg/dL Final  09/22/2021 05:53 PM 4.95 (H) 0.44 - 1.00 mg/dL Final  07/29/2021 09:24 PM 3.50 (H) 0.44 - 1.00 mg/dL Final  07/29/2021 09:15 PM 3.35 (H) 0.44 - 1.00 mg/dL Final  08/09/2015 10:08 AM 1.49 (H) 0.44 - 1.00 mg/dL Final  10/11/2007 04:55 AM 1.82 (H)  Final  10/10/2007 04:50 AM 1.83 (H)  Final  10/09/2007 06:25 AM 1.90 (H)  Final  10/08/2007 04:50 AM 1.92 (H)  Final  10/07/2007 05:14 AM 1.92 (H)  Final  10/06/2007 04:30 AM 2.29 (H)  Final  10/05/2007 06:00 AM 2.51 (H)   Final  10/04/2007 05:00 AM 3.30 (H)  Final  10/03/2007 04:10 PM 3.77 (H)  Final  10/03/2007 06:35 AM 3.95 (H)  Final  10/02/2007 02:07 PM 4.73 (H)  Final  07/17/2007 05:15 AM 2.01 (H)  Final  07/16/2007 05:00 AM 2.65 DELTA CHECK NOTED (H)  Final  07/15/2007 02:15 AM 4.31 (H)  Final  07/14/2007 08:20 PM 4.72 (H)  Final  10/31/2006 05:35 AM 1.60 (H)  Final  10/30/2006 05:57 AM 1.80 (H)  Final  10/29/2006 04:00 AM 2.09 (H)  Final  10/28/2006 06:20 AM 2.66 (H)  Final  10/27/2006 03:55 PM 3.28 (H)  Final  10/27/2006 06:20 AM 3.70 (H)  Final  10/26/2006 04:00 AM 5.36 (H)  Final  10/25/2006 04:00 AM 7.61 (H)  Final  10/24/2006 04:50 AM 9.56 (H)  Final  10/24/2006 04:50 AM 9.68 (H)  Final  10/23/2006 04:00 AM 11.58 (H)  Final  10/22/2006 05:30 AM 13.84 (H)  Final  10/21/2006 04:37 PM 14.31 (H)  Final  10/21/2006 06:00 AM 15.25 (H)  Final  10/20/2006 02:45 AM 16.34 (H)  Final  10/19/2006 06:40 PM 16.59 (H)  Final     PMHx:   Past Medical History:  Diagnosis Date   Anxiety    Arthritis    CKD (chronic kidney disease)    Dr. Hinda Lenis   Depression    GERD (gastroesophageal reflux disease)    Hyperlipidemia    Hypertension    Osteoporosis    Stroke [facility])    UTI (lower urinary tract infection)     Past Surgical History:  Procedure Laterality Date   CHOLECYSTECTOMY      Family Hx:  Family History  Problem Relation Age of Onset   Cancer Mother    Cancer Father     Social History:  reports that she has never smoked. She has never used smokeless tobacco. She reports that she does not drink alcohol and does not use drugs.  Allergies:  Allergies  Allergen Reactions   Ciprofloxacin     Acute renal failure   Diphenhydramine Hcl     REACTION: UNKNOWN REACTION   Sulfonamide Derivatives Rash    Medications: Prior to Admission medications   Medication Sig Start Date End Date Taking? Authorizing Provider  acetaminophen (TYLENOL) 650 MG CR tablet Take 650 mg by mouth every  8 (eight) hours as needed for pain.   Yes [provider]  atorvastatin (LIPITOR) 20 MG tablet Take 1 tablet (20 mg total) by mouth daily. 04/08/21  Yes Eulogio Bear, NP  Cranberry-Vitamin C-Vitamin E (CRANBERRY PLUS VITAMIN C) 4200-20-3 MG-MG-UNIT CAPS Take by mouth.   Yes [provider]  diclofenac Sodium (VOLTAREN) 1 % GEL Apply 2 g topically 4 (four) times daily. 08/26/20  Yes Edsel Petrin,  Leona Carry, NP  dipyridamole-aspirin (AGGRENOX) 200-25 MG 12hr capsule Take 1 capsule by mouth 2 (two) times daily. 04/08/21  Yes Noemi Chapel A, NP  labetalol (NORMODYNE) 100 MG tablet Take 1 tablet (100 mg total) by mouth 2 (two) times daily. 04/29/21  Yes Eulogio Bear, NP  Omega-3 Fatty Acids (OMEGA-3 FISH OIL PO) Take by mouth.   Yes [provider]  omeprazole (PRILOSEC) 20 MG capsule TAKE 1 CAPSULE(20 MG) BY MOUTH DAILY 04/29/21  Yes Noemi Chapel A, NP  traMADol (ULTRAM) 50 MG tablet Take 1 tablet (50 mg total) by mouth daily as needed. 04/01/08  Yes Delora Fuel, MD  amLODipine (NORVASC) 2.5 MG tablet Take 2.5 mg by mouth daily.    [provider]  calcitRIOL (ROCALTROL) 0.25 MCG capsule Take 0.25 mcg by mouth daily.    [provider]  furosemide (LASIX) 20 MG tablet Take 20 mg by mouth 2 (two) times daily. Patient not taking: Reported on 09/22/2021 06/15/20   [provider]    I have reviewed the patient's current medications.  Labs:  Results for orders placed or performed during the hospital encounter of 09/22/21 (from the past 48 hour(s))  CBC with Differential     Status: Abnormal   Collection Time: 09/22/21  5:53 PM  Result Value Ref Range   WBC 6.6 4.0 - 10.5 K/uL   RBC 3.45 (L) 3.87 - 5.11 MIL/uL   Hemoglobin 11.2 (L) 12.0 - 15.0 g/dL   HCT 36.4 36.0 - 46.0 %   MCV 105.5 (H) 80.0 - 100.0 fL   MCH 32.5 26.0 - 34.0 pg   MCHC 30.8 30.0 - 36.0 g/dL   RDW 13.8 11.5 - 15.5 %   Platelets 169 150 - 400 K/uL   nRBC 0.0 0.0 - 0.2  %   Neutrophils Relative % 60 %   Neutro Abs 4.0 1.7 - 7.7 K/uL   Lymphocytes Relative 25 %   Lymphs Abs 1.7 0.7 - 4.0 K/uL   Monocytes Relative 11 %   Monocytes Absolute 0.8 0.1 - 1.0 K/uL   Eosinophils Relative 2 %   Eosinophils Absolute 0.2 0.0 - 0.5 K/uL   Basophils Relative 1 %   Basophils Absolute 0.0 0.0 - 0.1 K/uL   Immature Granulocytes 1 %   Abs Immature Granulocytes 0.04 0.00 - 0.07 K/uL    Comment: Performed at [facility], [address] [place], [place] [address]  Basic metabolic panel     Status: Abnormal   Collection Time: 09/22/21  5:53 PM  Result Value Ref Range   Sodium 141 135 - 145 mmol/L   Potassium 3.9 3.5 - 5.1 mmol/L   Chloride 113 (H) 98 - 111 mmol/L   CO2 19 (L) 22 - 32 mmol/L   Glucose, Bld 147 (H) 70 - 99 mg/dL    Comment: Glucose reference range applies only to samples taken after fasting for at least 8 hours.   BUN 67 (H) 8 - 23 mg/dL   Creatinine, Ser 4.95 (H) 0.44 - 1.00 mg/dL   Calcium 8.4 (L) 8.9 - 10.3 mg/dL   GFR, Estimated 8 (L) >60 mL/min    Comment: (NOTE) Calculated using the CKD-EPI Creatinine Equation (2021)    Anion gap 9 5 - 15    Comment: Performed at [facility], [address] [place], [place] [address]  Urinalysis, Routine w reflex microscopic Urine, Clean Catch     Status: Abnormal   Collection Time: 09/22/21  9:00 PM  Result Value Ref Range   Color, Urine YELLOW YELLOW   APPearance CLOUDY (A) CLEAR   Specific Gravity, Urine 1.006 1.005 - 1.030   pH 5.0 5.0 - 8.0   Glucose, UA NEGATIVE NEGATIVE mg/dL   Hgb urine dipstick SMALL (A) NEGATIVE   Bilirubin Urine NEGATIVE NEGATIVE   Ketones, ur NEGATIVE NEGATIVE mg/dL   Protein, ur NEGATIVE NEGATIVE mg/dL   Nitrite NEGATIVE NEGATIVE   Leukocytes,Ua MODERATE (A) NEGATIVE   RBC / HPF 0-5 0 - 5 RBC/hpf   WBC, UA >50 (H) 0 - 5 WBC/hpf   Bacteria, UA MANY (A) NONE SEEN   Squamous Epithelial / LPF 0-5 0 - 5   WBC Clumps PRESENT    Mucus PRESENT    Hyaline Casts, UA PRESENT      Comment: Performed at [facility], [address] [place], [place] [address]  Comprehensive metabolic panel     Status: Abnormal   Collection Time: 09/23/21  4:04 AM  Result Value Ref Range   Sodium 143 135 - 145 mmol/L   Potassium 3.2 (L) 3.5 - 5.1 mmol/L    Comment: DELTA CHECK NOTED   Chloride 113 (H) 98 - 111 mmol/L   CO2 21 (L) 22 - 32 mmol/L   Glucose, Bld 87 70 - 99 mg/dL    Comment: Glucose reference range applies only to samples taken after fasting for at least 8 hours.   BUN 62 (H) 8 - 23 mg/dL   Creatinine, Ser 4.22 (H) 0.44 - 1.00 mg/dL   Calcium 8.2 (L) 8.9 - 10.3 mg/dL   Total Protein 6.4 (L) 6.5 - 8.1 g/dL   Albumin 3.2 (L) 3.5 - 5.0 g/dL   AST 22 15 - 41 U/L   ALT 17 0 - 44 U/L   Alkaline Phosphatase 56 38 - 126 U/L   Total Bilirubin 0.7 0.3 - 1.2 mg/dL   GFR, Estimated 10 (L) >60 mL/min    Comment: (NOTE) Calculated using the CKD-EPI Creatinine Equation (2021)    Anion gap 9 5 - 15    Comment: Performed at [facility], [address], [place], [place] [address]  CBC     Status: Abnormal   Collection Time: 09/23/21  4:04 AM  Result Value Ref Range   WBC 5.8 4.0 - 10.5 K/uL   RBC 3.19 (L) 3.87 - 5.11 MIL/uL   Hemoglobin 10.6 (L) 12.0 - 15.0 g/dL   HCT 33.5 (L) 36.0 - 46.0 %   MCV 105.0 (H) 80.0 - 100.0 fL   MCH 33.2 26.0 - 34.0 pg   MCHC 31.6 30.0 - 36.0 g/dL   RDW 13.9 11.5 - 15.5 %   Platelets 157 150 - 400 K/uL   nRBC 0.0 0.0 - 0.2 %    Comment: Performed at [facility], [address], [place], [place] [address]  APTT     Status: None   Collection Time: 09/23/21  4:04 AM  Result Value Ref Range   aPTT 28 24 - 36 seconds    Comment: Performed at [facility], [address] [place], [place] [address]  Magnesium     Status: Abnormal   Collection Time: 09/23/21  4:04 AM  Result Value Ref Range   Magnesium 1.6 (L) 1.7 - 2.4 mg/dL    Comment: Performed at [facility], [address], [place], [place] [address]  Phosphorus     Status: None    Collection Time: 09/23/21  4:04 AM  Result Value Ref Range  Phosphorus 2.7 2.5 - 4.6 mg/dL    Comment: Performed at [facility], [address], [place], [place] [address]  Folate     Status: None   Collection Time: 09/23/21  4:04 AM  Result Value Ref Range   Folate 21.4 >5.9 ng/mL    Comment: Performed at [facility], [address], [place], [place] [address]  Vitamin B12     Status: None   Collection Time: 09/23/21  4:04 AM  Result Value Ref Range   Vitamin B-12 446 180 - 914 pg/mL    Comment: (NOTE) This assay is not validated for testing neonatal or myeloproliferative syndrome specimens for Vitamin B12 levels. Performed at [facility], [address] [place], [place] [address]      ROS:  A comprehensive review of systems was negative except for: Musculoskeletal: positive for arthralgias.  Patient indicates that she was living alone but now will be living with family.  Family has taken up some of the small tasks of home management from the patient and she is okay with that  Physical Exam: Vitals:   09/23/21 0103 09/23/21 0522  BP: (!) 104/49 (!) 141/52  Pulse: 72 66  Resp: 16 15  Temp: 98 F (36.7 C) 98.1 F (36.7 C)  SpO2: 99% 100%     General: Pleasant, possibly a little bit confused but I suspect at baseline, no acute distress HEENT: Pupils are equal round reactive to light, extraocular motions are intact, mucous membranes are moist Neck: No JVD Heart: Regular rate and rhythm Lungs: Mostly clear bilaterally-on room air Abdomen: Soft, nontender, nondistended Extremities: No peripheral edema Skin: Warm and dry Neuro: Alert, some difficulty articulating, nonfocal  Assessment/Plan: 88 year old black female with past medical history significant for CKD after significant AKI episode in the past.  Kidney disease has been progressing as an outpatient 1.Renal-patient has had long history of CKD.  It does seem to be progressing as outpatient as last creatinine was 3.5  indicating GFR of 12.  Therefore, current values are not that far off.  She feels better with minimal intervention although she might just be saying that.  It is really difficult to tell if clinical picture is changing due to age or her CKD.  It appears that discussions have been held regarding dialysis and patient has declined that option which is a reasonable decision for her.  I do not know if there is much more intervention to be done-I think this can be managed in the outpatient setting 2. Hypertension/volume  -is apparently on low-dose Norvasc and Lasix as an outpatient.  She may not need Lasix since she has responded favorably to giving a little bit of fluid back and her appetite is depressed 3. Anemia  -current hemoglobin over 10 does not require any additional treatment at this time 4. Possible UTI-I am not sure she necessarily needs treatment given that she does not have symptoms and nitrite was negative  Patient will not be seen over the weekend.  As above, the kidney issue is chronic and I am not sure much reversibility is to be had.  She is declining the dialysis option.  I would not be opposed to this being managed in the outpatient setting  Louis Meckel 09/23/2021, 9:43 AM

## 2021-09-23 NOTE — TOC Initial Note (Signed)
Transition of Care [facility]) - Initial/Assessment Note    Patient Details  Name: [name] MRN: [id number] Date of Birth: 1930/10/05  [facility] CM/SW Contact:    Iona Beard, [place] Phone Number: 09/23/2021, 12:37 PM  Clinical Narrative:                 TOC updated that pt will need HH PT at D/C. CSW met with pt in room about this. Pt states that she is interested in [facility] PT services. Pt states she does not have a preference in company. CSW gave [facility] PT referral to Judson Roch with [facility] awaiting response if they can accept. Pt states that she has a cane, walker and wheelchair in the home to use when needed. TOC to follow.   Expected Discharge Plan: [place] Barriers to Discharge: Continued Medical Work up   Patient Goals and CMS Choice Patient states their goals for this hospitalization and ongoing recovery are:: return home with [facility] CMS Medicare.gov Compare Post Acute Care list provided to:: Patient Choice offered to / list presented to : Patient  Expected Discharge Plan and Services Expected Discharge Plan: [place] In-house Referral: Clinical Social Work Discharge Planning Services: CM Consult Post Acute Care Choice: [place] arrangements for the past 2 months: Single Family Home                                      Prior Living Arrangements/Services Living arrangements for the past 2 months: Single Family Home Lives with:: Adult Children Patient language and need for interpreter reviewed:: Yes Do you feel safe going back to the place where you live?: Yes      Need for Family Participation in Patient Care: Yes (Comment) Care giver support system in place?: Yes (comment) Current home services: DME Criminal Activity/Legal Involvement Pertinent to Current Situation/Hospitalization: No - Comment as needed  Activities of Daily Living Home Assistive Devices/Equipment: Cane (specify quad or  straight) ADL Screening (condition at time of admission) Patient's cognitive ability adequate to safely complete daily activities?: Yes Is the patient deaf or have difficulty hearing?: No Does the patient have difficulty seeing, even when wearing glasses/contacts?: No Does the patient have difficulty concentrating, remembering, or making decisions?: No Patient able to express need for assistance with ADLs?: Yes Does the patient have difficulty dressing or bathing?: No Independently performs ADLs?: Yes (appropriate for developmental age) Does the patient have difficulty walking or climbing stairs?: Yes Weakness of Legs: Both Weakness of Arms/Hands: None  Permission Sought/Granted                  Emotional Assessment Appearance:: Appears stated age Attitude/Demeanor/Rapport: Engaged Affect (typically observed): Accepting Orientation: : Oriented to Self, Oriented to Place, Oriented to  Time, Oriented to Situation Alcohol / Substance Use: Not Applicable Psych Involvement: No (comment)  Admission diagnosis:  Azotemia [R79.89] Acute kidney injury superimposed on chronic kidney disease ([place]) [N17.9, N18.9] Patient Active Problem List   Diagnosis Date Noted   Mixed hyperlipidemia 09/23/2021   Acute renal failure superimposed on stage 4 chronic kidney disease ([place]) 09/22/2021   Prediabetes 08/29/2020   Fatigue 08/26/2020   Protein-calorie malnutrition ([place]) 04/19/2020   GERD (gastroesophageal reflux disease) 08/12/2019   Bilateral primary osteoarthritis of knee 11/09/2016   History of stroke 06/02/2016   Osteoarthritis 03/25/2014   Anxiety and depression 02/20/2012  Chronic headache 02/20/2012   Peripheral edema 02/19/2012   Hyperparathyroidism ([place]) 10/23/2008   Essential hypertension 10/23/2008   GALLSTONE PANCREATITIS 10/23/2008   Chronic kidney disease 10/23/2008   DEGENERATIVE JOINT DISEASE 10/23/2008   PROTEINURIA 10/23/2008   PCP:  [name], MD Pharmacy:    [facility] [place], [place] [address] Phone: [phone] Fax: [phone]     Social Determinants of Health (SDOH) Interventions    Readmission Risk Interventions     No data to display

## 2021-09-23 NOTE — Assessment & Plan Note (Addendum)
Secondary to volume depletion and poor oral intake Baseline creatinine 2.6-3.0 in April 2023 Baseline creatinine up to 3.0-3.4 in May 2023. Continue judicious IV fluid Patient presented with serum creatinine 4.95>>3.10 on day of dc 09/22/2021 CT renal protocol--no hydronephrosis 6/30 US renal--echogenic kidneys; no hydronephrosis Hold furosemide>>follow up with Dr. [name] for adjustment

## 2021-09-23 NOTE — Assessment & Plan Note (Signed)
Continue statin. 

## 2021-09-24 DIAGNOSIS — N390 Urinary tract infection, site not specified: Secondary | ICD-10-CM

## 2021-09-24 DIAGNOSIS — N3000 Acute cystitis without hematuria: Secondary | ICD-10-CM

## 2021-09-24 DIAGNOSIS — E782 Mixed hyperlipidemia: Secondary | ICD-10-CM | POA: Diagnosis not present

## 2021-09-24 DIAGNOSIS — N184 Chronic kidney disease, stage 4 (severe): Secondary | ICD-10-CM | POA: Diagnosis not present

## 2021-09-24 DIAGNOSIS — N179 Acute kidney failure, unspecified: Secondary | ICD-10-CM | POA: Diagnosis not present

## 2021-09-24 LAB — BASIC METABOLIC PANEL
Anion gap: 5 (ref 5–15)
BUN: 49 mg/dL — ABNORMAL HIGH (ref 8–23)
CO2: 17 mmol/L — ABNORMAL LOW (ref 22–32)
Calcium: 7.7 mg/dL — ABNORMAL LOW (ref 8.9–10.3)
Chloride: 121 mmol/L — ABNORMAL HIGH (ref 98–111)
Creatinine, Ser: 3.1 mg/dL — ABNORMAL HIGH (ref 0.44–1.00)
GFR, Estimated: 14 mL/min — ABNORMAL LOW (ref >60)
Glucose, Bld: 76 mg/dL (ref 70–99)
Potassium: 3.7 mmol/L (ref 3.5–5.1)
Sodium: 143 mmol/L (ref 135–145)

## 2021-09-24 LAB — MAGNESIUM: Magnesium: 1.8 mg/dL (ref 1.7–2.4)

## 2021-09-24 MED ORDER — CEFDINIR 300 MG PO CAPS: 300.0000 mg | ORAL_CAPSULE | Freq: Two times a day (BID) | ORAL | Status: DC

## 2021-09-24 MED ORDER — CEFDINIR 300 MG PO CAPS
300.0000 mg | ORAL_CAPSULE | Freq: Every day | ORAL | Status: DC
  Administered 2021-09-24: 300 mg via ORAL
  Filled 2021-09-24: qty 1

## 2021-09-24 MED ORDER — CEFDINIR 300 MG PO CAPS: 300.0000 mg | ORAL_CAPSULE | Freq: Every day | ORAL | 0 refills | Status: DC

## 2021-09-24 NOTE — Progress Notes (Signed)
PROGRESS NOTE  [name] RKY:[id number] DOB: [birth year] DOA: 09/22/2021 PCP: [name], MD  Brief History:  88 year old female with history of anxiety, CKD stage IV, hypertension, hyperlipidemia, stroke presenting with elevated serum creatinine.  The patient states that she had a office visit with her PCP on 09/19/2021.  She had been feeling fatigued and malaised for the better part of the last 2 weeks with decreased oral intake.  Apparently, UA was obtained and suggested UTI.  The patient was prescribed ciprofloxacin which she has not taken it.  Nevertheless, routine blood work was obtained and showed the patient had an elevated serum creatinine.  She went to see her nephrologist [name]) on 09/22/2021 who repeated blood work and noted that her serum creatinine remained elevated.  As result, the patient was sent to the emergency department for further evaluation and treatment.  Notably, the patient's daughter at the bedside who supplements the history states that the patient recently had her furosemide decreased to 20 mg twice daily (from 40 mg am, 20 pm) on 09/12/2021 because of elevation in her serum creatinine.  The patient denies any fevers, chills, chest pain, shortness breath, abdominal pain, nausea, vomiting, diarrhea.  She denies any NSAIDs or other over-the-counter supplements.  She has had decreased oral intake for the better part of the last 2 to 3 weeks. In the ED, the patient was afebrile and hemodynamically stable.  Oxygen saturation was 100% room air.  BMP showed sodium 141, potassium 3.9, serum creatinine 4.95.  WBC 6.6, hemoglobin 1.2, platelets 169,000.  UA showed >50 WBC.  The patient was started on IV fluids.   Assessment and Plan: * Acute renal failure superimposed on stage 4 chronic kidney disease ([place]) Secondary to volume depletion and poor oral intake Baseline creatinine 2.6-3.0 in April 2023 Baseline creatinine up to 3.0-3.4 in May 2023. Continue judicious  IV fluid Patient presented with serum creatinine 4.95 09/22/2021 CT renal protocol--no hydronephrosis Hold furosemide  Mixed hyperlipidemia Continue statin  GERD (gastroesophageal reflux disease) Continue PPI  Essential hypertension Continue labetalol Hold amlodipine      Family Communication:   daughter at bedside 6/30  Consultants:  renal  Code Status:  FULL   DVT Prophylaxis:  [place] Heparin    Procedures: As Listed in Progress Note Above  Antibiotics: Ceftriaxone 6/30>>      Subjective: Patient denies fevers, chills, headache, chest pain, dyspnea, nausea, vomiting, diarrhea, abdominal pain, dysuria, hematuria, hematochezia, and melena.   Objective: Vitals:   09/23/21 1343 09/23/21 1348 09/23/21 2039 09/24/21 0528  BP: 137/60  (!) 160/67 128/66  Pulse: 66  65 68  Resp:  '18 18 18  '$ Temp: 98 F (36.7 C)  98.6 F (37 C) 97.9 F (36.6 C)  TempSrc: Oral  Oral Oral  SpO2: 100%  98% 98%  Weight:      Height:        Intake/Output Summary (Last 24 hours) at 09/24/2021 0924 Last data filed at 09/24/2021 0703 Gross per 24 hour  Intake 277.52 ml  Output 500 ml  Net -222.48 ml   Weight change:  Exam:  General:  Pt is alert, follows commands appropriately, not in acute distress HEENT: No icterus, No thrush, No neck mass, [place]/AT Cardiovascular: RRR, S1/S2, no rubs, no gallops Respiratory: CTA bilaterally, no wheezing, no crackles, no rhonchi Abdomen: Soft/+BS, non tender, non distended, no guarding Extremities: No edema, No lymphangitis, No petechiae, No rashes, no synovitis  Data Reviewed: I have personally reviewed following labs and imaging studies Basic Metabolic Panel: Recent Labs  Lab 09/22/21 1753 09/23/21 0404 09/24/21 0506  NA 141 143 143  K 3.9 3.2* 3.7  CL 113* 113* 121*  CO2 19* 21* 17*  GLUCOSE 147* 87 76  BUN 67* 62* 49*  CREATININE 4.95* 4.22* 3.10*  CALCIUM 8.4* 8.2* 7.7*  MG  --  1.6* 1.8  PHOS  --  2.7  --    Liver Function  Tests: Recent Labs  Lab 09/23/21 0404  AST 22  ALT 17  ALKPHOS 56  BILITOT 0.7  PROT 6.4*  ALBUMIN 3.2*   No results for input(s): "LIPASE", "AMYLASE" in the last 168 hours. No results for input(s): "AMMONIA" in the last 168 hours. Coagulation Profile: No results for input(s): "INR", "PROTIME" in the last 168 hours. CBC: Recent Labs  Lab 09/22/21 1753 09/23/21 0404  WBC 6.6 5.8  NEUTROABS 4.0  --   HGB 11.2* 10.6*  HCT 36.4 33.5*  MCV 105.5* 105.0*  PLT 169 157   Cardiac Enzymes: No results for input(s): "CKTOTAL", "CKMB", "CKMBINDEX", "TROPONINI" in the last 168 hours. BNP: Invalid input(s): "POCBNP" CBG: No results for input(s): "GLUCAP" in the last 168 hours. HbA1C: No results for input(s): "HGBA1C" in the last 72 hours. Urine analysis:    Component Value Date/Time   COLORURINE YELLOW 09/22/2021 2100   APPEARANCEUR CLOUDY (A) 09/22/2021 2100   LABSPEC 1.006 09/22/2021 2100   PHURINE 5.0 09/22/2021 2100   GLUCOSEU NEGATIVE 09/22/2021 2100   HGBUR SMALL (A) 09/22/2021 2100   BILIRUBINUR NEGATIVE 09/22/2021 2100    NEGATIVE 09/22/2021 2100   PROTEINUR NEGATIVE 09/22/2021 2100   UROBILINOGEN 0.2 08/13/2015 2036   NITRITE NEGATIVE 09/22/2021 2100   LEUKOCYTESUR MODERATE (A) 09/22/2021 2100   Sepsis Labs: '@LABRCNTIP'$ (procalcitonin:4,lacticidven:4) )No results found for this or any previous visit (from the past 240 hour(s)).   Scheduled Meds:  atorvastatin  20 mg Oral Daily   heparin  5,000 Units Subcutaneous Q8H   labetalol  100 mg Oral BID   omega-3 acid ethyl esters  1 g Oral Daily   pantoprazole  40 mg Oral Daily   Continuous Infusions:  cefTRIAXone (ROCEPHIN)  IV 1 g (09/23/21 1602)    Procedures/Studies: US Renal  Result Date: 09/23/2021 CLINICAL DATA:  Azotemia. EXAM: RENAL / URINARY TRACT ULTRASOUND COMPLETE COMPARISON:  CT stone study 09/22/2021. FINDINGS: Right Kidney: Renal measurements: 6.7 x 3.7 x 4.0 cm = volume: 52 mL. Slightly  increased echogenicity in the right kidney. No hydronephrosis. No discrete renal lesion identified but limited evaluation for subtle lesions on this examination. Review of recent CT demonstrated a small exophytic structure along the medial right kidney upper pole that measures up to 1.0 cm and probably new since MRI from 10/04/2007. Left Kidney: Renal measurements: 6.9 x 3.1 x 3.0 cm = volume: 33 mL. Slightly increased echogenicity in left kidney. No hydronephrosis. No discrete lesion. Bladder: Appears normal for degree of bladder distention. Bilateral ureter jets. Other: None. IMPRESSION: 1. Both kidneys are slightly echogenic and small. Findings could be associated with chronic medical renal disease. 2. Negative for hydronephrosis. 3. Small exophytic structure along the right kidney upper pole on the recent CT is not identified on this examination. In a patient of this age with abnormal renal function, consider surveillance of this area with cross-sectional imaging. Electronically Signed   By: Markus Daft [name]   On: 09/23/2021 09:49   CT Renal Stone Study  Result  Date: 09/22/2021 CLINICAL DATA:  Nephrolithiasis, acute renal failure EXAM: CT ABDOMEN AND PELVIS WITHOUT CONTRAST TECHNIQUE: Multidetector CT imaging of the abdomen and pelvis was performed following the standard protocol without IV contrast. RADIATION DOSE REDUCTION: This exam was performed according to the departmental dose-optimization program which includes automated exposure control, adjustment of the mA and/or kV according to patient size and/or use of iterative reconstruction technique. COMPARISON:  None Available. FINDINGS: Lower chest: Bibasilar atelectasis. Ascending thoracic aortic aneurysm is incompletely visualized, better seen on CT examination of 07/29/2021. Moderate coronary artery calcification. Global cardiac size within normal limits. Hepatobiliary: No focal liver abnormality is seen. Status post cholecystectomy. No biliary  dilatation. Pancreas: Unremarkable Spleen: Unremarkable Adrenals/Urinary Tract: Adrenal glands are unremarkable. Kidneys are normal, without renal calculi, focal lesion, or hydronephrosis. Bladder is unremarkable. Stomach/Bowel: Moderate to severe pancolonic diverticulosis, most severe within the sigmoid colon without superimposed acute inflammatory change. Stomach, small bowel, and large bowel are otherwise unremarkable. Appendix normal. No free intraperitoneal gas or fluid. Vascular/Lymphatic: Extensive aortoiliac atherosclerotic calcification. No abdominal aortic aneurysm. No pathologic adenopathy within the abdomen and pelvis. Reproductive: Uterus and bilateral adnexa are unremarkable. Other: Tiny fat containing umbilical hernia. Musculoskeletal: Osseous structures are diffusely osteopenic. Degenerative changes seen throughout the visualized thoracolumbar spine. No acute bone abnormality. IMPRESSION: 1. No acute intra-abdominal pathology identified. No definite radiographic explanation for the patient's reported symptoms. Normal noncontrast examination of the kidneys and renal collecting system. 2. Moderate to severe pancolonic diverticulosis without superimposed acute inflammatory change. Electronically Signed   By: Fidela Salisbury [name]   On: 09/22/2021 23:20    Orson Eva, DO  [facility]  If 7PM-7AM, please contact night-coverage [email or web address] Password [email or web address] 09/24/2021, 9:24 AM   LOS: 1 day

## 2021-09-24 NOTE — Progress Notes (Signed)
Nsg Discharge Note  Admit Date:  09/22/2021 Discharge date: 09/24/2021   Sela Hua to be D/C'd Home per MD order.  AVS completed.  Copy for chart, and copy for patient signed, and dated. Patient/caregiver able to verbalize understanding.  Discharge Medication: Allergies as of 09/24/2021       Reactions   Ciprofloxacin    Acute renal failure   Diphenhydramine Hcl    REACTION: UNKNOWN REACTION   Sulfonamide Derivatives Rash        Medication List     STOP taking these medications    amLODipine 2.5 MG tablet Commonly known as: NORVASC   furosemide 20 MG tablet Commonly known as: LASIX       TAKE these medications    acetaminophen 650 MG CR tablet Commonly known as: TYLENOL Take 650 mg by mouth every 8 (eight) hours as needed for pain.   atorvastatin 20 MG tablet Commonly known as: LIPITOR Take 1 tablet (20 mg total) by mouth daily.   calcitRIOL 0.25 MCG capsule Commonly known as: ROCALTROL Take 0.25 mcg by mouth daily.   cefdinir 300 MG capsule Commonly known as: OMNICEF Take 1 capsule (300 mg total) by mouth daily.   Cranberry Plus Vitamin C 4200-20-3 MG-MG-UNIT Caps Generic drug: Cranberry-Vitamin C-Vitamin E Take by mouth.   diclofenac Sodium 1 % Gel Commonly known as: Voltaren Apply 2 g topically 4 (four) times daily.   dipyridamole-aspirin 200-25 MG 12hr capsule Commonly known as: AGGRENOX Take 1 capsule by mouth 2 (two) times daily.   labetalol 100 MG tablet Commonly known as: NORMODYNE Take 1 tablet (100 mg total) by mouth 2 (two) times daily.   OMEGA-3 FISH OIL PO Take by mouth.   omeprazole 20 MG capsule Commonly known as: PRILOSEC TAKE 1 CAPSULE(20 MG) BY MOUTH DAILY   traMADol 50 MG tablet Commonly known as: ULTRAM Take 1 tablet (50 mg total) by mouth daily as needed.        Discharge Assessment: Vitals:   09/23/21 2039 09/24/21 0528  BP: (!) 160/67 128/66  Pulse: 65 68  Resp: 18 18  Temp: 98.6 F (37 C) 97.9 F  (36.6 C)  SpO2: 98% 98%   Skin clean, dry and intact without evidence of skin break down, no evidence of skin tears noted. IV catheter discontinued intact. Site without signs and symptoms of complications - no redness or edema noted at insertion site, patient denies c/o pain - only slight tenderness at site.  Dressing with slight pressure applied.  D/c Instructions-Education: Discharge instructions given to patient/family with verbalized understanding. D/c education completed with patient/family including follow up instructions, medication list, d/c activities limitations if indicated, with other d/c instructions as indicated by MD - patient able to verbalize understanding, all questions fully answered. Patient instructed to return to ED, call 911, or call MD for any changes in condition.  Patient escorted via [place], and D/C home via private auto.  Clovis Fredrickson, LPN 0/04/5425 06:23 AM

## 2021-09-24 NOTE — Assessment & Plan Note (Signed)
UA > 50 WBC Started on empiric ceftriaxone D/c home with cefdinir x 4 more days

## 2021-09-24 NOTE — Discharge Summary (Signed)
Physician Discharge Summary   Patient: [name] MRN: [id number] DOB: Dec 30, 1930  Admit date:     09/22/2021  Discharge date: 09/24/21  Discharge Physician: Shanon Brow Loral Campi   PCP: [name], MD   Recommendations at discharge:   Please follow up with primary care provider within 1-2 weeks  Please repeat BMP and CBC in one week  Please follow up on/with nephrology, Dr. Vonzella Nipple Course: 88 year old female with history of anxiety, CKD stage IV, hypertension, hyperlipidemia, stroke presenting with elevated serum creatinine.  The patient states that she had a office visit with her PCP on 09/19/2021.  She had been feeling fatigued and malaised for the better part of the last 2 weeks with decreased oral intake.  Apparently, UA was obtained and suggested UTI.  The patient was prescribed ciprofloxacin which she has not taken it.  Nevertheless, routine blood work was obtained and showed the patient had an elevated serum creatinine.  She went to see her nephrologist [name]) on 09/22/2021 who repeated blood work and noted that her serum creatinine remained elevated.  As result, the patient was sent to the emergency department for further evaluation and treatment.  Notably, the patient's daughter at the bedside who supplements the history states that the patient recently had her furosemide decreased to 20 mg twice daily (from 40 mg am, 20 pm) on 09/12/2021 because of elevation in her serum creatinine.  The patient denies any fevers, chills, chest pain, shortness breath, abdominal pain, nausea, vomiting, diarrhea.  She denies any NSAIDs or other over-the-counter supplements.  She has had decreased oral intake for the better part of the last 2 to 3 weeks. In the ED, the patient was afebrile and hemodynamically stable.  Oxygen saturation was 100% room air.  BMP showed sodium 141, potassium 3.9, serum creatinine 4.95.  WBC 6.6, hemoglobin 1.2, platelets 169,000.  UA showed >50 WBC.  The patient was  started on IV fluids.  Assessment and Plan: * Acute renal failure superimposed on stage 4 chronic kidney disease ([place]) Secondary to volume depletion and poor oral intake Baseline creatinine 2.6-3.0 in April 2023 Baseline creatinine up to 3.0-3.4 in May 2023. Continue judicious IV fluid Patient presented with serum creatinine 4.95>>3.10 on day of dc 09/22/2021 CT renal protocol--no hydronephrosis 6/30 US renal--echogenic kidneys; no hydronephrosis Hold furosemide>>follow up with Dr. [name] for adjustment  UTI (urinary tract infection) UA > 50 WBC Started on empiric ceftriaxone D/c home with cefdinir x 4 more days  Mixed hyperlipidemia Continue statin  GERD (gastroesophageal reflux disease) Continue PPI  Essential hypertension Continue labetalol Hold amlodipine         Consultants: renal Procedures performed: none  Disposition: Home Diet recommendation:  Cardiac diet DISCHARGE MEDICATION: Allergies as of 09/24/2021       Reactions   Ciprofloxacin    Acute renal failure   Diphenhydramine Hcl    REACTION: UNKNOWN REACTION   Sulfonamide Derivatives Rash        Medication List     STOP taking these medications    amLODipine 2.5 MG tablet Commonly known as: NORVASC   furosemide 20 MG tablet Commonly known as: LASIX       TAKE these medications    acetaminophen 650 MG CR tablet Commonly known as: TYLENOL Take 650 mg by mouth every 8 (eight) hours as needed for pain.   atorvastatin 20 MG tablet Commonly known as: LIPITOR Take 1 tablet (20 mg total) by mouth daily.   calcitRIOL 0.25 MCG  capsule Commonly known as: ROCALTROL Take 0.25 mcg by mouth daily.   cefdinir 300 MG capsule Commonly known as: OMNICEF Take 1 capsule (300 mg total) by mouth daily.   Cranberry Plus Vitamin C 4200-20-3 MG-MG-UNIT Caps Generic drug: Cranberry-Vitamin C-Vitamin E Take by mouth.   diclofenac Sodium 1 % Gel Commonly known as: Voltaren Apply 2 g topically 4  (four) times daily.   dipyridamole-aspirin 200-25 MG 12hr capsule Commonly known as: AGGRENOX Take 1 capsule by mouth 2 (two) times daily.   labetalol 100 MG tablet Commonly known as: NORMODYNE Take 1 tablet (100 mg total) by mouth 2 (two) times daily.   OMEGA-3 FISH OIL PO Take by mouth.   omeprazole 20 MG capsule Commonly known as: PRILOSEC TAKE 1 CAPSULE(20 MG) BY MOUTH DAILY   traMADol 50 MG tablet Commonly known as: ULTRAM Take 1 tablet (50 mg total) by mouth daily as needed.        Discharge Exam: Filed Weights   09/22/21 1730 09/22/21 2115  Weight: 59 kg 59.8 kg   HEENT:  [place]/AT, No thrush, no icterus CV:  RRR, no rub, no S3, no S4 Lung:  CTA, no wheeze, no rhonchi Abd:  soft/+BS, NT Ext:  No edema, no lymphangitis, no synovitis, no rash   Condition at discharge: stable  The results of significant diagnostics from this hospitalization (including imaging, microbiology, ancillary and laboratory) are listed below for reference.   Imaging Studies: US Renal  Result Date: 09/23/2021 CLINICAL DATA:  Azotemia. EXAM: RENAL / URINARY TRACT ULTRASOUND COMPLETE COMPARISON:  CT stone study 09/22/2021. FINDINGS: Right Kidney: Renal measurements: 6.7 x 3.7 x 4.0 cm = volume: 52 mL. Slightly increased echogenicity in the right kidney. No hydronephrosis. No discrete renal lesion identified but limited evaluation for subtle lesions on this examination. Review of recent CT demonstrated a small exophytic structure along the medial right kidney upper pole that measures up to 1.0 cm and probably new since MRI from 10/04/2007. Left Kidney: Renal measurements: 6.9 x 3.1 x 3.0 cm = volume: 33 mL. Slightly increased echogenicity in left kidney. No hydronephrosis. No discrete lesion. Bladder: Appears normal for degree of bladder distention. Bilateral ureter jets. Other: None. IMPRESSION: 1. Both kidneys are slightly echogenic and small. Findings could be associated with chronic medical renal  disease. 2. Negative for hydronephrosis. 3. Small exophytic structure along the right kidney upper pole on the recent CT is not identified on this examination. In a patient of this age with abnormal renal function, consider surveillance of this area with cross-sectional imaging. Electronically Signed   By: Markus Daft [name]   On: 09/23/2021 09:49   CT Renal Stone Study  Result Date: 09/22/2021 CLINICAL DATA:  Nephrolithiasis, acute renal failure EXAM: CT ABDOMEN AND PELVIS WITHOUT CONTRAST TECHNIQUE: Multidetector CT imaging of the abdomen and pelvis was performed following the standard protocol without IV contrast. RADIATION DOSE REDUCTION: This exam was performed according to the departmental dose-optimization program which includes automated exposure control, adjustment of the mA and/or kV according to patient size and/or use of iterative reconstruction technique. COMPARISON:  None Available. FINDINGS: Lower chest: Bibasilar atelectasis. Ascending thoracic aortic aneurysm is incompletely visualized, better seen on CT examination of 07/29/2021. Moderate coronary artery calcification. Global cardiac size within normal limits. Hepatobiliary: No focal liver abnormality is seen. Status post cholecystectomy. No biliary dilatation. Pancreas: Unremarkable Spleen: Unremarkable Adrenals/Urinary Tract: Adrenal glands are unremarkable. Kidneys are normal, without renal calculi, focal lesion, or hydronephrosis. Bladder is unremarkable. Stomach/Bowel: Moderate to severe  pancolonic diverticulosis, most severe within the sigmoid colon without superimposed acute inflammatory change. Stomach, small bowel, and large bowel are otherwise unremarkable. Appendix normal. No free intraperitoneal gas or fluid. Vascular/Lymphatic: Extensive aortoiliac atherosclerotic calcification. No abdominal aortic aneurysm. No pathologic adenopathy within the abdomen and pelvis. Reproductive: Uterus and bilateral adnexa are unremarkable. Other: Tiny  fat containing umbilical hernia. Musculoskeletal: Osseous structures are diffusely osteopenic. Degenerative changes seen throughout the visualized thoracolumbar spine. No acute bone abnormality. IMPRESSION: 1. No acute intra-abdominal pathology identified. No definite radiographic explanation for the patient's reported symptoms. Normal noncontrast examination of the kidneys and renal collecting system. 2. Moderate to severe pancolonic diverticulosis without superimposed acute inflammatory change. Electronically Signed   By: Fidela Salisbury [name]   On: 09/22/2021 23:20    Microbiology: Results for orders placed or performed in visit on 08/12/19  Urine Culture     Status: None   Collection Time: 08/12/19  2:49 PM   Specimen: Urine  Result Value Ref Range Status   MICRO NUMBER: [id number]  Final   SPECIMEN QUALITY: Adequate  Final   Sample Source NOT GIVEN  Final   STATUS: FINAL  Final   ISOLATE 1:   Final    Growth of mixed flora was isolated, suggesting probable contamination. No further testing will be performed. If clinically indicated, recollection using a method to minimize contamination, with prompt transfer to Urine Culture Transport Tube, is  recommended.     Labs: CBC: Recent Labs  Lab 09/22/21 1753 09/23/21 0404  WBC 6.6 5.8  NEUTROABS 4.0  --   HGB 11.2* 10.6*  HCT 36.4 33.5*  MCV 105.5* 105.0*  PLT 169 676   Basic Metabolic Panel: Recent Labs  Lab 09/22/21 1753 09/23/21 0404 09/24/21 0506  NA 141 143 143  K 3.9 3.2* 3.7  CL 113* 113* 121*  CO2 19* 21* 17*  GLUCOSE 147* 87 76  BUN 67* 62* 49*  CREATININE 4.95* 4.22* 3.10*  CALCIUM 8.4* 8.2* 7.7*  MG  --  1.6* 1.8  PHOS  --  2.7  --    Liver Function Tests: Recent Labs  Lab 09/23/21 0404  AST 22  ALT 17  ALKPHOS 56  BILITOT 0.7  PROT 6.4*  ALBUMIN 3.2*   CBG: No results for input(s): "GLUCAP" in the last 168 hours.  Discharge time spent: greater than 30 minutes.  Signed: Orson Eva, MD [facility] 09/24/2021

## 2021-09-25 LAB — URINE CULTURE: Culture: >=100000 — AB

## 2021-09-26 ENCOUNTER — Telehealth: Payer: Self-pay

## 2021-09-26 NOTE — Telephone Encounter (Signed)
Called to do TOC. Pt is currently being seen by the [facility] as her PCP per pt's daughter, Shelby Dubin.

## 2021-09-27 DIAGNOSIS — N184 Chronic kidney disease, stage 4 (severe): Secondary | ICD-10-CM | POA: Diagnosis not present

## 2021-09-27 DIAGNOSIS — F419 Anxiety disorder, unspecified: Secondary | ICD-10-CM | POA: Diagnosis not present

## 2021-09-27 DIAGNOSIS — R69 Illness, unspecified: Secondary | ICD-10-CM | POA: Diagnosis not present

## 2021-09-27 DIAGNOSIS — E782 Mixed hyperlipidemia: Secondary | ICD-10-CM | POA: Diagnosis not present

## 2021-09-27 DIAGNOSIS — Z7982 Long term (current) use of aspirin: Secondary | ICD-10-CM | POA: Diagnosis not present

## 2021-09-27 DIAGNOSIS — Z79891 Long term (current) use of opiate analgesic: Secondary | ICD-10-CM | POA: Diagnosis not present

## 2021-09-27 DIAGNOSIS — N390 Urinary tract infection, site not specified: Secondary | ICD-10-CM | POA: Diagnosis not present

## 2021-09-27 DIAGNOSIS — I708 Atherosclerosis of other arteries: Secondary | ICD-10-CM | POA: Diagnosis not present

## 2021-09-27 DIAGNOSIS — N179 Acute kidney failure, unspecified: Secondary | ICD-10-CM | POA: Diagnosis not present

## 2021-09-27 DIAGNOSIS — I129 Hypertensive chronic kidney disease with stage 1 through stage 4 chronic kidney disease, or unspecified chronic kidney disease: Secondary | ICD-10-CM | POA: Diagnosis not present

## 2021-09-27 DIAGNOSIS — Z9181 History of falling: Secondary | ICD-10-CM | POA: Diagnosis not present

## 2021-09-27 DIAGNOSIS — D631 Anemia in chronic kidney disease: Secondary | ICD-10-CM | POA: Diagnosis not present

## 2021-09-27 DIAGNOSIS — M810 Age-related osteoporosis without current pathological fracture: Secondary | ICD-10-CM | POA: Diagnosis not present

## 2021-09-27 DIAGNOSIS — Z8673 Personal history of transient ischemic attack (TIA), and cerebral infarction without residual deficits: Secondary | ICD-10-CM | POA: Diagnosis not present

## 2021-09-27 DIAGNOSIS — M170 Bilateral primary osteoarthritis of knee: Secondary | ICD-10-CM | POA: Diagnosis not present

## 2021-09-27 DIAGNOSIS — E46 Unspecified protein-calorie malnutrition: Secondary | ICD-10-CM | POA: Diagnosis not present

## 2021-10-04 DIAGNOSIS — N184 Chronic kidney disease, stage 4 (severe): Secondary | ICD-10-CM | POA: Diagnosis not present

## 2021-10-04 DIAGNOSIS — I10 Essential (primary) hypertension: Secondary | ICD-10-CM | POA: Diagnosis not present

## 2021-10-05 DIAGNOSIS — D631 Anemia in chronic kidney disease: Secondary | ICD-10-CM | POA: Diagnosis not present

## 2021-10-05 DIAGNOSIS — N185 Chronic kidney disease, stage 5: Secondary | ICD-10-CM | POA: Diagnosis not present

## 2021-10-05 DIAGNOSIS — I9589 Other hypotension: Secondary | ICD-10-CM | POA: Diagnosis not present

## 2021-10-05 DIAGNOSIS — N170 Acute kidney failure with tubular necrosis: Secondary | ICD-10-CM | POA: Diagnosis not present

## 2021-10-05 DIAGNOSIS — I120 Hypertensive chronic kidney disease with stage 5 chronic kidney disease or end stage renal disease: Secondary | ICD-10-CM | POA: Diagnosis not present

## 2021-10-05 DIAGNOSIS — Z8679 Personal history of other diseases of the circulatory system: Secondary | ICD-10-CM | POA: Diagnosis not present

## 2021-10-05 DIAGNOSIS — N189 Chronic kidney disease, unspecified: Secondary | ICD-10-CM | POA: Diagnosis not present

## 2021-10-06 DIAGNOSIS — I129 Hypertensive chronic kidney disease with stage 1 through stage 4 chronic kidney disease, or unspecified chronic kidney disease: Secondary | ICD-10-CM | POA: Diagnosis not present

## 2021-10-06 DIAGNOSIS — R809 Proteinuria, unspecified: Secondary | ICD-10-CM | POA: Diagnosis not present

## 2021-10-06 DIAGNOSIS — I120 Hypertensive chronic kidney disease with stage 5 chronic kidney disease or end stage renal disease: Secondary | ICD-10-CM | POA: Diagnosis not present

## 2021-10-06 DIAGNOSIS — E8722 Chronic metabolic acidosis: Secondary | ICD-10-CM | POA: Diagnosis not present

## 2021-10-06 DIAGNOSIS — N170 Acute kidney failure with tubular necrosis: Secondary | ICD-10-CM | POA: Diagnosis not present

## 2021-10-06 DIAGNOSIS — N189 Chronic kidney disease, unspecified: Secondary | ICD-10-CM | POA: Diagnosis not present

## 2021-10-06 DIAGNOSIS — E211 Secondary hyperparathyroidism, not elsewhere classified: Secondary | ICD-10-CM | POA: Diagnosis not present

## 2021-10-06 DIAGNOSIS — D631 Anemia in chronic kidney disease: Secondary | ICD-10-CM | POA: Diagnosis not present

## 2021-10-06 DIAGNOSIS — N185 Chronic kidney disease, stage 5: Secondary | ICD-10-CM | POA: Diagnosis not present

## 2021-10-08 DIAGNOSIS — E46 Unspecified protein-calorie malnutrition: Secondary | ICD-10-CM | POA: Diagnosis not present

## 2021-10-08 DIAGNOSIS — M170 Bilateral primary osteoarthritis of knee: Secondary | ICD-10-CM | POA: Diagnosis not present

## 2021-10-08 DIAGNOSIS — Z9181 History of falling: Secondary | ICD-10-CM | POA: Diagnosis not present

## 2021-10-08 DIAGNOSIS — Z7982 Long term (current) use of aspirin: Secondary | ICD-10-CM | POA: Diagnosis not present

## 2021-10-08 DIAGNOSIS — Z79891 Long term (current) use of opiate analgesic: Secondary | ICD-10-CM | POA: Diagnosis not present

## 2021-10-08 DIAGNOSIS — I129 Hypertensive chronic kidney disease with stage 1 through stage 4 chronic kidney disease, or unspecified chronic kidney disease: Secondary | ICD-10-CM | POA: Diagnosis not present

## 2021-10-08 DIAGNOSIS — N184 Chronic kidney disease, stage 4 (severe): Secondary | ICD-10-CM | POA: Diagnosis not present

## 2021-10-08 DIAGNOSIS — E782 Mixed hyperlipidemia: Secondary | ICD-10-CM | POA: Diagnosis not present

## 2021-10-08 DIAGNOSIS — N390 Urinary tract infection, site not specified: Secondary | ICD-10-CM | POA: Diagnosis not present

## 2021-10-08 DIAGNOSIS — M810 Age-related osteoporosis without current pathological fracture: Secondary | ICD-10-CM | POA: Diagnosis not present

## 2021-10-08 DIAGNOSIS — R69 Illness, unspecified: Secondary | ICD-10-CM | POA: Diagnosis not present

## 2021-10-08 DIAGNOSIS — Z8673 Personal history of transient ischemic attack (TIA), and cerebral infarction without residual deficits: Secondary | ICD-10-CM | POA: Diagnosis not present

## 2021-10-08 DIAGNOSIS — N179 Acute kidney failure, unspecified: Secondary | ICD-10-CM | POA: Diagnosis not present

## 2021-10-11 DIAGNOSIS — Z79891 Long term (current) use of opiate analgesic: Secondary | ICD-10-CM | POA: Diagnosis not present

## 2021-10-11 DIAGNOSIS — N179 Acute kidney failure, unspecified: Secondary | ICD-10-CM | POA: Diagnosis not present

## 2021-10-11 DIAGNOSIS — I129 Hypertensive chronic kidney disease with stage 1 through stage 4 chronic kidney disease, or unspecified chronic kidney disease: Secondary | ICD-10-CM | POA: Diagnosis not present

## 2021-10-11 DIAGNOSIS — Z9181 History of falling: Secondary | ICD-10-CM | POA: Diagnosis not present

## 2021-10-11 DIAGNOSIS — M170 Bilateral primary osteoarthritis of knee: Secondary | ICD-10-CM | POA: Diagnosis not present

## 2021-10-11 DIAGNOSIS — Z7982 Long term (current) use of aspirin: Secondary | ICD-10-CM | POA: Diagnosis not present

## 2021-10-11 DIAGNOSIS — M810 Age-related osteoporosis without current pathological fracture: Secondary | ICD-10-CM | POA: Diagnosis not present

## 2021-10-11 DIAGNOSIS — E782 Mixed hyperlipidemia: Secondary | ICD-10-CM | POA: Diagnosis not present

## 2021-10-11 DIAGNOSIS — R69 Illness, unspecified: Secondary | ICD-10-CM | POA: Diagnosis not present

## 2021-10-11 DIAGNOSIS — E46 Unspecified protein-calorie malnutrition: Secondary | ICD-10-CM | POA: Diagnosis not present

## 2021-10-11 DIAGNOSIS — Z8673 Personal history of transient ischemic attack (TIA), and cerebral infarction without residual deficits: Secondary | ICD-10-CM | POA: Diagnosis not present

## 2021-10-11 DIAGNOSIS — N184 Chronic kidney disease, stage 4 (severe): Secondary | ICD-10-CM | POA: Diagnosis not present

## 2021-10-11 DIAGNOSIS — N390 Urinary tract infection, site not specified: Secondary | ICD-10-CM | POA: Diagnosis not present

## 2021-10-14 DIAGNOSIS — Z79891 Long term (current) use of opiate analgesic: Secondary | ICD-10-CM | POA: Diagnosis not present

## 2021-10-14 DIAGNOSIS — E782 Mixed hyperlipidemia: Secondary | ICD-10-CM | POA: Diagnosis not present

## 2021-10-14 DIAGNOSIS — N390 Urinary tract infection, site not specified: Secondary | ICD-10-CM | POA: Diagnosis not present

## 2021-10-14 DIAGNOSIS — M810 Age-related osteoporosis without current pathological fracture: Secondary | ICD-10-CM | POA: Diagnosis not present

## 2021-10-14 DIAGNOSIS — I129 Hypertensive chronic kidney disease with stage 1 through stage 4 chronic kidney disease, or unspecified chronic kidney disease: Secondary | ICD-10-CM | POA: Diagnosis not present

## 2021-10-14 DIAGNOSIS — Z9181 History of falling: Secondary | ICD-10-CM | POA: Diagnosis not present

## 2021-10-14 DIAGNOSIS — R69 Illness, unspecified: Secondary | ICD-10-CM | POA: Diagnosis not present

## 2021-10-14 DIAGNOSIS — N179 Acute kidney failure, unspecified: Secondary | ICD-10-CM | POA: Diagnosis not present

## 2021-10-14 DIAGNOSIS — N184 Chronic kidney disease, stage 4 (severe): Secondary | ICD-10-CM | POA: Diagnosis not present

## 2021-10-14 DIAGNOSIS — Z7982 Long term (current) use of aspirin: Secondary | ICD-10-CM | POA: Diagnosis not present

## 2021-10-14 DIAGNOSIS — M170 Bilateral primary osteoarthritis of knee: Secondary | ICD-10-CM | POA: Diagnosis not present

## 2021-10-14 DIAGNOSIS — Z8673 Personal history of transient ischemic attack (TIA), and cerebral infarction without residual deficits: Secondary | ICD-10-CM | POA: Diagnosis not present

## 2021-10-14 DIAGNOSIS — E46 Unspecified protein-calorie malnutrition: Secondary | ICD-10-CM | POA: Diagnosis not present

## 2021-10-18 DIAGNOSIS — N184 Chronic kidney disease, stage 4 (severe): Secondary | ICD-10-CM | POA: Diagnosis not present

## 2021-10-18 DIAGNOSIS — E782 Mixed hyperlipidemia: Secondary | ICD-10-CM | POA: Diagnosis not present

## 2021-10-18 DIAGNOSIS — I120 Hypertensive chronic kidney disease with stage 5 chronic kidney disease or end stage renal disease: Secondary | ICD-10-CM | POA: Diagnosis not present

## 2021-10-18 DIAGNOSIS — Z8673 Personal history of transient ischemic attack (TIA), and cerebral infarction without residual deficits: Secondary | ICD-10-CM | POA: Diagnosis not present

## 2021-10-18 DIAGNOSIS — Z7982 Long term (current) use of aspirin: Secondary | ICD-10-CM | POA: Diagnosis not present

## 2021-10-18 DIAGNOSIS — R69 Illness, unspecified: Secondary | ICD-10-CM | POA: Diagnosis not present

## 2021-10-18 DIAGNOSIS — E211 Secondary hyperparathyroidism, not elsewhere classified: Secondary | ICD-10-CM | POA: Diagnosis not present

## 2021-10-18 DIAGNOSIS — I129 Hypertensive chronic kidney disease with stage 1 through stage 4 chronic kidney disease, or unspecified chronic kidney disease: Secondary | ICD-10-CM | POA: Diagnosis not present

## 2021-10-18 DIAGNOSIS — M170 Bilateral primary osteoarthritis of knee: Secondary | ICD-10-CM | POA: Diagnosis not present

## 2021-10-18 DIAGNOSIS — Z79891 Long term (current) use of opiate analgesic: Secondary | ICD-10-CM | POA: Diagnosis not present

## 2021-10-18 DIAGNOSIS — E46 Unspecified protein-calorie malnutrition: Secondary | ICD-10-CM | POA: Diagnosis not present

## 2021-10-18 DIAGNOSIS — N179 Acute kidney failure, unspecified: Secondary | ICD-10-CM | POA: Diagnosis not present

## 2021-10-18 DIAGNOSIS — N390 Urinary tract infection, site not specified: Secondary | ICD-10-CM | POA: Diagnosis not present

## 2021-10-18 DIAGNOSIS — R809 Proteinuria, unspecified: Secondary | ICD-10-CM | POA: Diagnosis not present

## 2021-10-18 DIAGNOSIS — N185 Chronic kidney disease, stage 5: Secondary | ICD-10-CM | POA: Diagnosis not present

## 2021-10-18 DIAGNOSIS — M810 Age-related osteoporosis without current pathological fracture: Secondary | ICD-10-CM | POA: Diagnosis not present

## 2021-10-18 DIAGNOSIS — N189 Chronic kidney disease, unspecified: Secondary | ICD-10-CM | POA: Diagnosis not present

## 2021-10-18 DIAGNOSIS — Z9181 History of falling: Secondary | ICD-10-CM | POA: Diagnosis not present

## 2021-10-18 DIAGNOSIS — N170 Acute kidney failure with tubular necrosis: Secondary | ICD-10-CM | POA: Diagnosis not present

## 2021-10-18 DIAGNOSIS — D631 Anemia in chronic kidney disease: Secondary | ICD-10-CM | POA: Diagnosis not present

## 2021-10-26 DIAGNOSIS — R69 Illness, unspecified: Secondary | ICD-10-CM | POA: Diagnosis not present

## 2021-10-26 DIAGNOSIS — N184 Chronic kidney disease, stage 4 (severe): Secondary | ICD-10-CM | POA: Diagnosis not present

## 2021-10-26 DIAGNOSIS — M810 Age-related osteoporosis without current pathological fracture: Secondary | ICD-10-CM | POA: Diagnosis not present

## 2021-10-26 DIAGNOSIS — Z79891 Long term (current) use of opiate analgesic: Secondary | ICD-10-CM | POA: Diagnosis not present

## 2021-10-26 DIAGNOSIS — M170 Bilateral primary osteoarthritis of knee: Secondary | ICD-10-CM | POA: Diagnosis not present

## 2021-10-26 DIAGNOSIS — E782 Mixed hyperlipidemia: Secondary | ICD-10-CM | POA: Diagnosis not present

## 2021-10-26 DIAGNOSIS — N179 Acute kidney failure, unspecified: Secondary | ICD-10-CM | POA: Diagnosis not present

## 2021-10-26 DIAGNOSIS — N390 Urinary tract infection, site not specified: Secondary | ICD-10-CM | POA: Diagnosis not present

## 2021-10-26 DIAGNOSIS — I129 Hypertensive chronic kidney disease with stage 1 through stage 4 chronic kidney disease, or unspecified chronic kidney disease: Secondary | ICD-10-CM | POA: Diagnosis not present

## 2021-10-26 DIAGNOSIS — E46 Unspecified protein-calorie malnutrition: Secondary | ICD-10-CM | POA: Diagnosis not present

## 2021-10-26 DIAGNOSIS — Z7982 Long term (current) use of aspirin: Secondary | ICD-10-CM | POA: Diagnosis not present

## 2021-10-26 DIAGNOSIS — Z8673 Personal history of transient ischemic attack (TIA), and cerebral infarction without residual deficits: Secondary | ICD-10-CM | POA: Diagnosis not present

## 2021-10-26 DIAGNOSIS — Z9181 History of falling: Secondary | ICD-10-CM | POA: Diagnosis not present

## 2021-10-28 ENCOUNTER — Other Ambulatory Visit: Payer: Self-pay | Admitting: Nurse Practitioner

## 2021-10-28 DIAGNOSIS — Z8673 Personal history of transient ischemic attack (TIA), and cerebral infarction without residual deficits: Secondary | ICD-10-CM

## 2021-10-28 NOTE — Telephone Encounter (Signed)
Provider no longer at practice Requested Prescriptions  Pending Prescriptions Disp Refills  . atorvastatin (LIPITOR) 20 MG tablet [Pharmacy Med Name: ATORVASTATIN '20MG'$  TABLETS] 90 tablet 1    Sig: TAKE 1 TABLET(20 MG) BY MOUTH DAILY     Cardiovascular:  Antilipid - Statins Failed - 10/28/2021 10:08 AM      Failed - Lipid Panel in normal range within the last 12 months    Cholesterol  Date Value Ref Range Status  08/26/2020 158 <200 mg/dL Final   LDL Cholesterol (Calc)  Date Value Ref Range Status  08/26/2020 88 mg/dL (calc) Final    Comment:    Reference range: <100 . Desirable range <100 mg/dL for primary prevention;   <70 mg/dL for patients with CHD or diabetic patients  with > or = 2 CHD risk factors. Marland Kitchen LDL-C is now calculated using the Martin-Hopkins  calculation, which is a validated novel method providing  better accuracy than the Friedewald equation in the  estimation of LDL-C.  Cresenciano Genre et al. Annamaria Helling. 6962;952(84): 2061-2068  (http://education.[facility].com/faq/FAQ164)    HDL  Date Value Ref Range Status  08/26/2020 47 (L) > OR = 50 mg/dL Final   Triglycerides  Date Value Ref Range Status  08/26/2020 125 <150 mg/dL Final         Passed - Patient is not pregnant      Passed - Valid encounter within last 12 months    Recent Outpatient Visits          6 months ago Acute midline low back pain with left-sided sciatica   [place] Eulogio Bear, NP   8 months ago Essential hypertension   [place] Eulogio Bear, NP   1 year ago Stage 4 chronic kidney disease ([place])   [place] Eulogio Bear, NP   1 year ago Essential hypertension   [place], [place] Nunnery, MD   1 year ago Routine general medical examination at a health care facility   [place], [place]ena Nunnery, MD

## 2021-11-04 DIAGNOSIS — Z9181 History of falling: Secondary | ICD-10-CM | POA: Diagnosis not present

## 2021-11-04 DIAGNOSIS — Z79891 Long term (current) use of opiate analgesic: Secondary | ICD-10-CM | POA: Diagnosis not present

## 2021-11-04 DIAGNOSIS — M170 Bilateral primary osteoarthritis of knee: Secondary | ICD-10-CM | POA: Diagnosis not present

## 2021-11-04 DIAGNOSIS — N179 Acute kidney failure, unspecified: Secondary | ICD-10-CM | POA: Diagnosis not present

## 2021-11-04 DIAGNOSIS — I129 Hypertensive chronic kidney disease with stage 1 through stage 4 chronic kidney disease, or unspecified chronic kidney disease: Secondary | ICD-10-CM | POA: Diagnosis not present

## 2021-11-04 DIAGNOSIS — Z7982 Long term (current) use of aspirin: Secondary | ICD-10-CM | POA: Diagnosis not present

## 2021-11-04 DIAGNOSIS — Z8673 Personal history of transient ischemic attack (TIA), and cerebral infarction without residual deficits: Secondary | ICD-10-CM | POA: Diagnosis not present

## 2021-11-04 DIAGNOSIS — E782 Mixed hyperlipidemia: Secondary | ICD-10-CM | POA: Diagnosis not present

## 2021-11-04 DIAGNOSIS — M810 Age-related osteoporosis without current pathological fracture: Secondary | ICD-10-CM | POA: Diagnosis not present

## 2021-11-04 DIAGNOSIS — N390 Urinary tract infection, site not specified: Secondary | ICD-10-CM | POA: Diagnosis not present

## 2021-11-04 DIAGNOSIS — R69 Illness, unspecified: Secondary | ICD-10-CM | POA: Diagnosis not present

## 2021-11-04 DIAGNOSIS — N184 Chronic kidney disease, stage 4 (severe): Secondary | ICD-10-CM | POA: Diagnosis not present

## 2021-11-04 DIAGNOSIS — E46 Unspecified protein-calorie malnutrition: Secondary | ICD-10-CM | POA: Diagnosis not present

## 2021-11-08 DIAGNOSIS — Z79891 Long term (current) use of opiate analgesic: Secondary | ICD-10-CM | POA: Diagnosis not present

## 2021-11-08 DIAGNOSIS — E46 Unspecified protein-calorie malnutrition: Secondary | ICD-10-CM | POA: Diagnosis not present

## 2021-11-08 DIAGNOSIS — M170 Bilateral primary osteoarthritis of knee: Secondary | ICD-10-CM | POA: Diagnosis not present

## 2021-11-08 DIAGNOSIS — N184 Chronic kidney disease, stage 4 (severe): Secondary | ICD-10-CM | POA: Diagnosis not present

## 2021-11-08 DIAGNOSIS — M810 Age-related osteoporosis without current pathological fracture: Secondary | ICD-10-CM | POA: Diagnosis not present

## 2021-11-08 DIAGNOSIS — E782 Mixed hyperlipidemia: Secondary | ICD-10-CM | POA: Diagnosis not present

## 2021-11-08 DIAGNOSIS — Z9181 History of falling: Secondary | ICD-10-CM | POA: Diagnosis not present

## 2021-11-08 DIAGNOSIS — Z7982 Long term (current) use of aspirin: Secondary | ICD-10-CM | POA: Diagnosis not present

## 2021-11-08 DIAGNOSIS — Z8673 Personal history of transient ischemic attack (TIA), and cerebral infarction without residual deficits: Secondary | ICD-10-CM | POA: Diagnosis not present

## 2021-11-08 DIAGNOSIS — I129 Hypertensive chronic kidney disease with stage 1 through stage 4 chronic kidney disease, or unspecified chronic kidney disease: Secondary | ICD-10-CM | POA: Diagnosis not present

## 2021-11-08 DIAGNOSIS — N390 Urinary tract infection, site not specified: Secondary | ICD-10-CM | POA: Diagnosis not present

## 2021-11-08 DIAGNOSIS — N179 Acute kidney failure, unspecified: Secondary | ICD-10-CM | POA: Diagnosis not present

## 2021-11-08 DIAGNOSIS — R69 Illness, unspecified: Secondary | ICD-10-CM | POA: Diagnosis not present

## 2021-11-16 DIAGNOSIS — Z8673 Personal history of transient ischemic attack (TIA), and cerebral infarction without residual deficits: Secondary | ICD-10-CM | POA: Diagnosis not present

## 2021-11-16 DIAGNOSIS — R69 Illness, unspecified: Secondary | ICD-10-CM | POA: Diagnosis not present

## 2021-11-16 DIAGNOSIS — N179 Acute kidney failure, unspecified: Secondary | ICD-10-CM | POA: Diagnosis not present

## 2021-11-16 DIAGNOSIS — N184 Chronic kidney disease, stage 4 (severe): Secondary | ICD-10-CM | POA: Diagnosis not present

## 2021-11-16 DIAGNOSIS — E782 Mixed hyperlipidemia: Secondary | ICD-10-CM | POA: Diagnosis not present

## 2021-11-16 DIAGNOSIS — N390 Urinary tract infection, site not specified: Secondary | ICD-10-CM | POA: Diagnosis not present

## 2021-11-16 DIAGNOSIS — M810 Age-related osteoporosis without current pathological fracture: Secondary | ICD-10-CM | POA: Diagnosis not present

## 2021-11-16 DIAGNOSIS — M170 Bilateral primary osteoarthritis of knee: Secondary | ICD-10-CM | POA: Diagnosis not present

## 2021-11-16 DIAGNOSIS — Z7982 Long term (current) use of aspirin: Secondary | ICD-10-CM | POA: Diagnosis not present

## 2021-11-16 DIAGNOSIS — Z9181 History of falling: Secondary | ICD-10-CM | POA: Diagnosis not present

## 2021-11-16 DIAGNOSIS — E46 Unspecified protein-calorie malnutrition: Secondary | ICD-10-CM | POA: Diagnosis not present

## 2021-11-16 DIAGNOSIS — Z79891 Long term (current) use of opiate analgesic: Secondary | ICD-10-CM | POA: Diagnosis not present

## 2021-11-16 DIAGNOSIS — I129 Hypertensive chronic kidney disease with stage 1 through stage 4 chronic kidney disease, or unspecified chronic kidney disease: Secondary | ICD-10-CM | POA: Diagnosis not present

## 2021-11-25 DIAGNOSIS — M170 Bilateral primary osteoarthritis of knee: Secondary | ICD-10-CM | POA: Diagnosis not present

## 2021-11-25 DIAGNOSIS — N179 Acute kidney failure, unspecified: Secondary | ICD-10-CM | POA: Diagnosis not present

## 2021-11-25 DIAGNOSIS — E46 Unspecified protein-calorie malnutrition: Secondary | ICD-10-CM | POA: Diagnosis not present

## 2021-11-25 DIAGNOSIS — Z7982 Long term (current) use of aspirin: Secondary | ICD-10-CM | POA: Diagnosis not present

## 2021-11-25 DIAGNOSIS — Z8673 Personal history of transient ischemic attack (TIA), and cerebral infarction without residual deficits: Secondary | ICD-10-CM | POA: Diagnosis not present

## 2021-11-25 DIAGNOSIS — R69 Illness, unspecified: Secondary | ICD-10-CM | POA: Diagnosis not present

## 2021-11-25 DIAGNOSIS — M810 Age-related osteoporosis without current pathological fracture: Secondary | ICD-10-CM | POA: Diagnosis not present

## 2021-11-25 DIAGNOSIS — E782 Mixed hyperlipidemia: Secondary | ICD-10-CM | POA: Diagnosis not present

## 2021-11-25 DIAGNOSIS — Z79891 Long term (current) use of opiate analgesic: Secondary | ICD-10-CM | POA: Diagnosis not present

## 2021-11-25 DIAGNOSIS — N184 Chronic kidney disease, stage 4 (severe): Secondary | ICD-10-CM | POA: Diagnosis not present

## 2021-11-25 DIAGNOSIS — N390 Urinary tract infection, site not specified: Secondary | ICD-10-CM | POA: Diagnosis not present

## 2021-11-25 DIAGNOSIS — Z9181 History of falling: Secondary | ICD-10-CM | POA: Diagnosis not present

## 2021-11-25 DIAGNOSIS — I129 Hypertensive chronic kidney disease with stage 1 through stage 4 chronic kidney disease, or unspecified chronic kidney disease: Secondary | ICD-10-CM | POA: Diagnosis not present

## 2021-12-01 DIAGNOSIS — E46 Unspecified protein-calorie malnutrition: Secondary | ICD-10-CM | POA: Diagnosis not present

## 2021-12-01 DIAGNOSIS — Z79891 Long term (current) use of opiate analgesic: Secondary | ICD-10-CM | POA: Diagnosis not present

## 2021-12-01 DIAGNOSIS — I708 Atherosclerosis of other arteries: Secondary | ICD-10-CM | POA: Diagnosis not present

## 2021-12-01 DIAGNOSIS — Z8673 Personal history of transient ischemic attack (TIA), and cerebral infarction without residual deficits: Secondary | ICD-10-CM | POA: Diagnosis not present

## 2021-12-01 DIAGNOSIS — M170 Bilateral primary osteoarthritis of knee: Secondary | ICD-10-CM | POA: Diagnosis not present

## 2021-12-01 DIAGNOSIS — N179 Acute kidney failure, unspecified: Secondary | ICD-10-CM | POA: Diagnosis not present

## 2021-12-01 DIAGNOSIS — E782 Mixed hyperlipidemia: Secondary | ICD-10-CM | POA: Diagnosis not present

## 2021-12-01 DIAGNOSIS — Z9181 History of falling: Secondary | ICD-10-CM | POA: Diagnosis not present

## 2021-12-01 DIAGNOSIS — Z7982 Long term (current) use of aspirin: Secondary | ICD-10-CM | POA: Diagnosis not present

## 2021-12-01 DIAGNOSIS — R69 Illness, unspecified: Secondary | ICD-10-CM | POA: Diagnosis not present

## 2021-12-01 DIAGNOSIS — N184 Chronic kidney disease, stage 4 (severe): Secondary | ICD-10-CM | POA: Diagnosis not present

## 2021-12-01 DIAGNOSIS — I129 Hypertensive chronic kidney disease with stage 1 through stage 4 chronic kidney disease, or unspecified chronic kidney disease: Secondary | ICD-10-CM | POA: Diagnosis not present

## 2021-12-01 DIAGNOSIS — D631 Anemia in chronic kidney disease: Secondary | ICD-10-CM | POA: Diagnosis not present

## 2021-12-01 DIAGNOSIS — F419 Anxiety disorder, unspecified: Secondary | ICD-10-CM | POA: Diagnosis not present

## 2021-12-01 DIAGNOSIS — N390 Urinary tract infection, site not specified: Secondary | ICD-10-CM | POA: Diagnosis not present

## 2021-12-01 DIAGNOSIS — M810 Age-related osteoporosis without current pathological fracture: Secondary | ICD-10-CM | POA: Diagnosis not present

## 2021-12-06 DIAGNOSIS — F419 Anxiety disorder, unspecified: Secondary | ICD-10-CM | POA: Diagnosis not present

## 2021-12-06 DIAGNOSIS — D631 Anemia in chronic kidney disease: Secondary | ICD-10-CM | POA: Diagnosis not present

## 2021-12-06 DIAGNOSIS — M170 Bilateral primary osteoarthritis of knee: Secondary | ICD-10-CM | POA: Diagnosis not present

## 2021-12-06 DIAGNOSIS — R69 Illness, unspecified: Secondary | ICD-10-CM | POA: Diagnosis not present

## 2021-12-06 DIAGNOSIS — Z8673 Personal history of transient ischemic attack (TIA), and cerebral infarction without residual deficits: Secondary | ICD-10-CM | POA: Diagnosis not present

## 2021-12-06 DIAGNOSIS — Z7982 Long term (current) use of aspirin: Secondary | ICD-10-CM | POA: Diagnosis not present

## 2021-12-06 DIAGNOSIS — M810 Age-related osteoporosis without current pathological fracture: Secondary | ICD-10-CM | POA: Diagnosis not present

## 2021-12-06 DIAGNOSIS — I129 Hypertensive chronic kidney disease with stage 1 through stage 4 chronic kidney disease, or unspecified chronic kidney disease: Secondary | ICD-10-CM | POA: Diagnosis not present

## 2021-12-06 DIAGNOSIS — E46 Unspecified protein-calorie malnutrition: Secondary | ICD-10-CM | POA: Diagnosis not present

## 2021-12-06 DIAGNOSIS — Z9181 History of falling: Secondary | ICD-10-CM | POA: Diagnosis not present

## 2021-12-06 DIAGNOSIS — N390 Urinary tract infection, site not specified: Secondary | ICD-10-CM | POA: Diagnosis not present

## 2021-12-06 DIAGNOSIS — I708 Atherosclerosis of other arteries: Secondary | ICD-10-CM | POA: Diagnosis not present

## 2021-12-06 DIAGNOSIS — N179 Acute kidney failure, unspecified: Secondary | ICD-10-CM | POA: Diagnosis not present

## 2021-12-06 DIAGNOSIS — Z79891 Long term (current) use of opiate analgesic: Secondary | ICD-10-CM | POA: Diagnosis not present

## 2021-12-06 DIAGNOSIS — E782 Mixed hyperlipidemia: Secondary | ICD-10-CM | POA: Diagnosis not present

## 2021-12-06 DIAGNOSIS — N184 Chronic kidney disease, stage 4 (severe): Secondary | ICD-10-CM | POA: Diagnosis not present

## 2021-12-15 ENCOUNTER — Other Ambulatory Visit: Payer: Self-pay | Admitting: Nurse Practitioner

## 2021-12-15 DIAGNOSIS — I129 Hypertensive chronic kidney disease with stage 1 through stage 4 chronic kidney disease, or unspecified chronic kidney disease: Secondary | ICD-10-CM | POA: Diagnosis not present

## 2021-12-15 DIAGNOSIS — F419 Anxiety disorder, unspecified: Secondary | ICD-10-CM | POA: Diagnosis not present

## 2021-12-15 DIAGNOSIS — D631 Anemia in chronic kidney disease: Secondary | ICD-10-CM | POA: Diagnosis not present

## 2021-12-15 DIAGNOSIS — R69 Illness, unspecified: Secondary | ICD-10-CM | POA: Diagnosis not present

## 2021-12-15 DIAGNOSIS — N184 Chronic kidney disease, stage 4 (severe): Secondary | ICD-10-CM | POA: Diagnosis not present

## 2021-12-15 DIAGNOSIS — M810 Age-related osteoporosis without current pathological fracture: Secondary | ICD-10-CM | POA: Diagnosis not present

## 2021-12-15 DIAGNOSIS — Z7982 Long term (current) use of aspirin: Secondary | ICD-10-CM | POA: Diagnosis not present

## 2021-12-15 DIAGNOSIS — Z8673 Personal history of transient ischemic attack (TIA), and cerebral infarction without residual deficits: Secondary | ICD-10-CM | POA: Diagnosis not present

## 2021-12-15 DIAGNOSIS — E782 Mixed hyperlipidemia: Secondary | ICD-10-CM | POA: Diagnosis not present

## 2021-12-15 DIAGNOSIS — E46 Unspecified protein-calorie malnutrition: Secondary | ICD-10-CM | POA: Diagnosis not present

## 2021-12-15 DIAGNOSIS — Z9181 History of falling: Secondary | ICD-10-CM | POA: Diagnosis not present

## 2021-12-15 DIAGNOSIS — N179 Acute kidney failure, unspecified: Secondary | ICD-10-CM | POA: Diagnosis not present

## 2021-12-15 DIAGNOSIS — I708 Atherosclerosis of other arteries: Secondary | ICD-10-CM | POA: Diagnosis not present

## 2021-12-15 DIAGNOSIS — Z79891 Long term (current) use of opiate analgesic: Secondary | ICD-10-CM | POA: Diagnosis not present

## 2021-12-15 DIAGNOSIS — M170 Bilateral primary osteoarthritis of knee: Secondary | ICD-10-CM | POA: Diagnosis not present

## 2021-12-15 DIAGNOSIS — N390 Urinary tract infection, site not specified: Secondary | ICD-10-CM | POA: Diagnosis not present

## 2021-12-16 DIAGNOSIS — E875 Hyperkalemia: Secondary | ICD-10-CM | POA: Diagnosis not present

## 2021-12-16 DIAGNOSIS — N189 Chronic kidney disease, unspecified: Secondary | ICD-10-CM | POA: Diagnosis not present

## 2021-12-16 DIAGNOSIS — E211 Secondary hyperparathyroidism, not elsewhere classified: Secondary | ICD-10-CM | POA: Diagnosis not present

## 2021-12-16 DIAGNOSIS — D631 Anemia in chronic kidney disease: Secondary | ICD-10-CM | POA: Diagnosis not present

## 2021-12-16 DIAGNOSIS — I129 Hypertensive chronic kidney disease with stage 1 through stage 4 chronic kidney disease, or unspecified chronic kidney disease: Secondary | ICD-10-CM | POA: Diagnosis not present

## 2021-12-16 DIAGNOSIS — R809 Proteinuria, unspecified: Secondary | ICD-10-CM | POA: Diagnosis not present

## 2021-12-16 DIAGNOSIS — N184 Chronic kidney disease, stage 4 (severe): Secondary | ICD-10-CM | POA: Diagnosis not present

## 2021-12-16 NOTE — Telephone Encounter (Signed)
Requested medication (s) are due for refill today: yes  Requested medication (s) are on the active medication list: yes  Last refill:  04/08/21 #90 1 RF  Future visit scheduled: no  Notes to clinic:  Chart lists PCP at [facility]- pt never got 30 day courtesy refill but have no record of that practice or PCP in chart.   Requested Prescriptions  Pending Prescriptions Disp Refills   atorvastatin (LIPITOR) 20 MG tablet [Pharmacy Med Name: ATORVASTATIN '20MG'$  TABLETS] 90 tablet 1    Sig: TAKE 1 TABLET(20 MG) BY MOUTH DAILY     Cardiovascular:  Antilipid - Statins Failed - 12/15/2021  7:18 PM      Failed - Lipid Panel in normal range within the last 12 months    Cholesterol  Date Value Ref Range Status  08/26/2020 158 <200 mg/dL Final   LDL Cholesterol (Calc)  Date Value Ref Range Status  08/26/2020 88 mg/dL (calc) Final    Comment:    Reference range: <100 . Desirable range <100 mg/dL for primary prevention;   <70 mg/dL for patients with CHD or diabetic patients  with > or = 2 CHD risk factors. Marland Kitchen LDL-C is now calculated using the Martin-Hopkins  calculation, which is a validated novel method providing  better accuracy than the Friedewald equation in the  estimation of LDL-C.  Cresenciano Genre et al. Annamaria Helling. 3546;568(12): 2061-2068  (http://education.[facility].com/faq/FAQ164)    HDL  Date Value Ref Range Status  08/26/2020 47 (L) > OR = 50 mg/dL Final   Triglycerides  Date Value Ref Range Status  08/26/2020 125 <150 mg/dL Final         Passed - Patient is not pregnant      Passed - Valid encounter within last 12 months    Recent Outpatient Visits           8 months ago Acute midline low back pain with left-sided sciatica   [place] Eulogio Bear, NP   10 months ago Essential hypertension   [place] Eulogio Bear, NP   1 year ago Stage 4 chronic kidney disease ([place])   [place] Eulogio Bear, NP   1 year ago Essential hypertension   [place], Modena Nunnery, MD   1 year ago Routine general medical examination at a health care facility   [place], Modena Nunnery, MD

## 2021-12-20 DIAGNOSIS — I129 Hypertensive chronic kidney disease with stage 1 through stage 4 chronic kidney disease, or unspecified chronic kidney disease: Secondary | ICD-10-CM | POA: Diagnosis not present

## 2021-12-20 DIAGNOSIS — M810 Age-related osteoporosis without current pathological fracture: Secondary | ICD-10-CM | POA: Diagnosis not present

## 2021-12-20 DIAGNOSIS — Z79891 Long term (current) use of opiate analgesic: Secondary | ICD-10-CM | POA: Diagnosis not present

## 2021-12-20 DIAGNOSIS — R69 Illness, unspecified: Secondary | ICD-10-CM | POA: Diagnosis not present

## 2021-12-20 DIAGNOSIS — Z9181 History of falling: Secondary | ICD-10-CM | POA: Diagnosis not present

## 2021-12-20 DIAGNOSIS — E782 Mixed hyperlipidemia: Secondary | ICD-10-CM | POA: Diagnosis not present

## 2021-12-20 DIAGNOSIS — Z7982 Long term (current) use of aspirin: Secondary | ICD-10-CM | POA: Diagnosis not present

## 2021-12-20 DIAGNOSIS — N184 Chronic kidney disease, stage 4 (severe): Secondary | ICD-10-CM | POA: Diagnosis not present

## 2021-12-20 DIAGNOSIS — F419 Anxiety disorder, unspecified: Secondary | ICD-10-CM | POA: Diagnosis not present

## 2021-12-20 DIAGNOSIS — I708 Atherosclerosis of other arteries: Secondary | ICD-10-CM | POA: Diagnosis not present

## 2021-12-20 DIAGNOSIS — M170 Bilateral primary osteoarthritis of knee: Secondary | ICD-10-CM | POA: Diagnosis not present

## 2021-12-20 DIAGNOSIS — E46 Unspecified protein-calorie malnutrition: Secondary | ICD-10-CM | POA: Diagnosis not present

## 2021-12-20 DIAGNOSIS — D631 Anemia in chronic kidney disease: Secondary | ICD-10-CM | POA: Diagnosis not present

## 2021-12-20 DIAGNOSIS — N179 Acute kidney failure, unspecified: Secondary | ICD-10-CM | POA: Diagnosis not present

## 2021-12-20 DIAGNOSIS — Z8673 Personal history of transient ischemic attack (TIA), and cerebral infarction without residual deficits: Secondary | ICD-10-CM | POA: Diagnosis not present

## 2021-12-20 DIAGNOSIS — N390 Urinary tract infection, site not specified: Secondary | ICD-10-CM | POA: Diagnosis not present

## 2021-12-22 DIAGNOSIS — N184 Chronic kidney disease, stage 4 (severe): Secondary | ICD-10-CM | POA: Diagnosis not present

## 2021-12-22 DIAGNOSIS — I129 Hypertensive chronic kidney disease with stage 1 through stage 4 chronic kidney disease, or unspecified chronic kidney disease: Secondary | ICD-10-CM | POA: Diagnosis not present

## 2021-12-22 DIAGNOSIS — E8722 Chronic metabolic acidosis: Secondary | ICD-10-CM | POA: Diagnosis not present

## 2021-12-22 DIAGNOSIS — R809 Proteinuria, unspecified: Secondary | ICD-10-CM | POA: Diagnosis not present

## 2021-12-22 DIAGNOSIS — N170 Acute kidney failure with tubular necrosis: Secondary | ICD-10-CM | POA: Diagnosis not present

## 2021-12-22 DIAGNOSIS — E211 Secondary hyperparathyroidism, not elsewhere classified: Secondary | ICD-10-CM | POA: Diagnosis not present

## 2021-12-29 DIAGNOSIS — I739 Peripheral vascular disease, unspecified: Secondary | ICD-10-CM | POA: Diagnosis not present

## 2021-12-29 DIAGNOSIS — E785 Hyperlipidemia, unspecified: Secondary | ICD-10-CM | POA: Diagnosis not present

## 2021-12-29 DIAGNOSIS — N2581 Secondary hyperparathyroidism of renal origin: Secondary | ICD-10-CM | POA: Diagnosis not present

## 2021-12-29 DIAGNOSIS — I719 Aortic aneurysm of unspecified site, without rupture: Secondary | ICD-10-CM | POA: Diagnosis not present

## 2021-12-29 DIAGNOSIS — K219 Gastro-esophageal reflux disease without esophagitis: Secondary | ICD-10-CM | POA: Diagnosis not present

## 2021-12-29 DIAGNOSIS — R69 Illness, unspecified: Secondary | ICD-10-CM | POA: Diagnosis not present

## 2021-12-29 DIAGNOSIS — K08409 Partial loss of teeth, unspecified cause, unspecified class: Secondary | ICD-10-CM | POA: Diagnosis not present

## 2021-12-29 DIAGNOSIS — I129 Hypertensive chronic kidney disease with stage 1 through stage 4 chronic kidney disease, or unspecified chronic kidney disease: Secondary | ICD-10-CM | POA: Diagnosis not present

## 2021-12-29 DIAGNOSIS — I700 Atherosclerosis of aorta: Secondary | ICD-10-CM | POA: Diagnosis not present

## 2021-12-29 DIAGNOSIS — H269 Unspecified cataract: Secondary | ICD-10-CM | POA: Diagnosis not present

## 2021-12-29 DIAGNOSIS — I2510 Atherosclerotic heart disease of native coronary artery without angina pectoris: Secondary | ICD-10-CM | POA: Diagnosis not present

## 2022-01-05 DIAGNOSIS — I10 Essential (primary) hypertension: Secondary | ICD-10-CM | POA: Diagnosis not present

## 2022-01-05 DIAGNOSIS — M170 Bilateral primary osteoarthritis of knee: Secondary | ICD-10-CM | POA: Diagnosis not present

## 2022-01-05 DIAGNOSIS — L989 Disorder of the skin and subcutaneous tissue, unspecified: Secondary | ICD-10-CM | POA: Diagnosis not present

## 2022-01-05 DIAGNOSIS — N184 Chronic kidney disease, stage 4 (severe): Secondary | ICD-10-CM | POA: Diagnosis not present

## 2022-01-05 DIAGNOSIS — Z23 Encounter for immunization: Secondary | ICD-10-CM | POA: Diagnosis not present

## 2022-02-22 DIAGNOSIS — E8722 Chronic metabolic acidosis: Secondary | ICD-10-CM | POA: Diagnosis not present

## 2022-02-22 DIAGNOSIS — N184 Chronic kidney disease, stage 4 (severe): Secondary | ICD-10-CM | POA: Diagnosis not present

## 2022-02-22 DIAGNOSIS — I129 Hypertensive chronic kidney disease with stage 1 through stage 4 chronic kidney disease, or unspecified chronic kidney disease: Secondary | ICD-10-CM | POA: Diagnosis not present

## 2022-02-22 DIAGNOSIS — E211 Secondary hyperparathyroidism, not elsewhere classified: Secondary | ICD-10-CM | POA: Diagnosis not present

## 2022-02-22 DIAGNOSIS — N170 Acute kidney failure with tubular necrosis: Secondary | ICD-10-CM | POA: Diagnosis not present

## 2022-02-22 DIAGNOSIS — R809 Proteinuria, unspecified: Secondary | ICD-10-CM | POA: Diagnosis not present

## 2022-03-08 DIAGNOSIS — E211 Secondary hyperparathyroidism, not elsewhere classified: Secondary | ICD-10-CM | POA: Diagnosis not present

## 2022-03-08 DIAGNOSIS — R809 Proteinuria, unspecified: Secondary | ICD-10-CM | POA: Diagnosis not present

## 2022-03-08 DIAGNOSIS — D631 Anemia in chronic kidney disease: Secondary | ICD-10-CM | POA: Diagnosis not present

## 2022-03-08 DIAGNOSIS — I129 Hypertensive chronic kidney disease with stage 1 through stage 4 chronic kidney disease, or unspecified chronic kidney disease: Secondary | ICD-10-CM | POA: Diagnosis not present

## 2022-03-08 DIAGNOSIS — N189 Chronic kidney disease, unspecified: Secondary | ICD-10-CM | POA: Diagnosis not present

## 2022-03-08 DIAGNOSIS — N184 Chronic kidney disease, stage 4 (severe): Secondary | ICD-10-CM | POA: Diagnosis not present

## 2022-03-30 ENCOUNTER — Other Ambulatory Visit: Payer: Self-pay | Admitting: Nurse Practitioner

## 2022-03-30 NOTE — Telephone Encounter (Signed)
Unable to refill per protocol, last refill by another provider not at this practice. Will refuse.  Requested Prescriptions  Pending Prescriptions Disp Refills   omeprazole (PRILOSEC) 20 MG capsule [Pharmacy Med Name: OMEPRAZOLE '20MG'$  CAPSULES] 90 capsule 2    Sig: TAKE 1 CAPSULE(20 MG) BY MOUTH DAILY     Gastroenterology: Proton Pump Inhibitors Passed - 03/30/2022  6:23 AM      Passed - Valid encounter within last 12 months    Recent Outpatient Visits           11 months ago Acute midline low back pain with left-sided sciatica   [place] Eulogio Bear, NP   1 year ago Essential hypertension   [place] Eulogio Bear, NP   1 year ago Stage 4 chronic kidney disease ([place])   [place] Eulogio Bear, NP   1 year ago Essential hypertension   [place], Modena Nunnery, MD   2 years ago Routine general medical examination at a health care facility   [place], Modena Nunnery, MD

## 2022-03-31 DIAGNOSIS — R2689 Other abnormalities of gait and mobility: Secondary | ICD-10-CM | POA: Diagnosis not present

## 2022-03-31 DIAGNOSIS — R69 Illness, unspecified: Secondary | ICD-10-CM | POA: Diagnosis not present

## 2022-03-31 DIAGNOSIS — Z008 Encounter for other general examination: Secondary | ICD-10-CM | POA: Diagnosis not present

## 2022-03-31 DIAGNOSIS — E785 Hyperlipidemia, unspecified: Secondary | ICD-10-CM | POA: Diagnosis not present

## 2022-03-31 DIAGNOSIS — Z8673 Personal history of transient ischemic attack (TIA), and cerebral infarction without residual deficits: Secondary | ICD-10-CM | POA: Diagnosis not present

## 2022-03-31 DIAGNOSIS — I739 Peripheral vascular disease, unspecified: Secondary | ICD-10-CM | POA: Diagnosis not present

## 2022-03-31 DIAGNOSIS — I129 Hypertensive chronic kidney disease with stage 1 through stage 4 chronic kidney disease, or unspecified chronic kidney disease: Secondary | ICD-10-CM | POA: Diagnosis not present

## 2022-03-31 DIAGNOSIS — G8929 Other chronic pain: Secondary | ICD-10-CM | POA: Diagnosis not present

## 2022-03-31 DIAGNOSIS — K219 Gastro-esophageal reflux disease without esophagitis: Secondary | ICD-10-CM | POA: Diagnosis not present

## 2022-03-31 DIAGNOSIS — M810 Age-related osteoporosis without current pathological fracture: Secondary | ICD-10-CM | POA: Diagnosis not present

## 2022-03-31 DIAGNOSIS — R32 Unspecified urinary incontinence: Secondary | ICD-10-CM | POA: Diagnosis not present

## 2022-03-31 DIAGNOSIS — Z7982 Long term (current) use of aspirin: Secondary | ICD-10-CM | POA: Diagnosis not present

## 2022-03-31 DIAGNOSIS — N184 Chronic kidney disease, stage 4 (severe): Secondary | ICD-10-CM | POA: Diagnosis not present

## 2022-04-27 DIAGNOSIS — H04123 Dry eye syndrome of bilateral lacrimal glands: Secondary | ICD-10-CM | POA: Diagnosis not present

## 2022-04-27 DIAGNOSIS — H43813 Vitreous degeneration, bilateral: Secondary | ICD-10-CM | POA: Diagnosis not present

## 2022-04-27 DIAGNOSIS — H35033 Hypertensive retinopathy, bilateral: Secondary | ICD-10-CM | POA: Diagnosis not present

## 2022-04-27 DIAGNOSIS — H25813 Combined forms of age-related cataract, bilateral: Secondary | ICD-10-CM | POA: Diagnosis not present

## 2022-05-16 DIAGNOSIS — R809 Proteinuria, unspecified: Secondary | ICD-10-CM | POA: Diagnosis not present

## 2022-05-16 DIAGNOSIS — N184 Chronic kidney disease, stage 4 (severe): Secondary | ICD-10-CM | POA: Diagnosis not present

## 2022-05-16 DIAGNOSIS — N189 Chronic kidney disease, unspecified: Secondary | ICD-10-CM | POA: Diagnosis not present

## 2022-05-16 DIAGNOSIS — E211 Secondary hyperparathyroidism, not elsewhere classified: Secondary | ICD-10-CM | POA: Diagnosis not present

## 2022-05-16 DIAGNOSIS — I129 Hypertensive chronic kidney disease with stage 1 through stage 4 chronic kidney disease, or unspecified chronic kidney disease: Secondary | ICD-10-CM | POA: Diagnosis not present

## 2022-05-16 DIAGNOSIS — D631 Anemia in chronic kidney disease: Secondary | ICD-10-CM | POA: Diagnosis not present

## 2022-05-17 DIAGNOSIS — I129 Hypertensive chronic kidney disease with stage 1 through stage 4 chronic kidney disease, or unspecified chronic kidney disease: Secondary | ICD-10-CM | POA: Diagnosis not present

## 2022-05-17 DIAGNOSIS — N184 Chronic kidney disease, stage 4 (severe): Secondary | ICD-10-CM | POA: Diagnosis not present

## 2022-05-17 DIAGNOSIS — E211 Secondary hyperparathyroidism, not elsewhere classified: Secondary | ICD-10-CM | POA: Diagnosis not present

## 2022-05-17 DIAGNOSIS — N189 Chronic kidney disease, unspecified: Secondary | ICD-10-CM | POA: Diagnosis not present

## 2022-05-17 DIAGNOSIS — R809 Proteinuria, unspecified: Secondary | ICD-10-CM | POA: Diagnosis not present

## 2022-05-17 DIAGNOSIS — D631 Anemia in chronic kidney disease: Secondary | ICD-10-CM | POA: Diagnosis not present

## 2022-06-05 DIAGNOSIS — I700 Atherosclerosis of aorta: Secondary | ICD-10-CM | POA: Diagnosis not present

## 2022-06-05 DIAGNOSIS — N2581 Secondary hyperparathyroidism of renal origin: Secondary | ICD-10-CM | POA: Diagnosis not present

## 2022-06-05 DIAGNOSIS — N184 Chronic kidney disease, stage 4 (severe): Secondary | ICD-10-CM | POA: Diagnosis not present

## 2022-06-05 DIAGNOSIS — Z9989 Dependence on other enabling machines and devices: Secondary | ICD-10-CM | POA: Diagnosis not present

## 2022-06-05 DIAGNOSIS — M170 Bilateral primary osteoarthritis of knee: Secondary | ICD-10-CM | POA: Diagnosis not present

## 2022-06-29 ENCOUNTER — Other Ambulatory Visit: Payer: Self-pay | Admitting: Nurse Practitioner

## 2022-06-29 DIAGNOSIS — I10 Essential (primary) hypertension: Secondary | ICD-10-CM

## 2022-07-01 ENCOUNTER — Other Ambulatory Visit: Payer: Self-pay | Admitting: Nurse Practitioner

## 2022-07-01 DIAGNOSIS — Z8673 Personal history of transient ischemic attack (TIA), and cerebral infarction without residual deficits: Secondary | ICD-10-CM

## 2022-07-03 NOTE — Telephone Encounter (Signed)
No longer under prescriber care, will refuse this medication.  Requested Prescriptions  Refused Prescriptions Disp Refills   dipyridamole-aspirin (AGGRENOX) 200-25 MG 12hr capsule [Pharmacy Med Name: ASPIRIN-DIPYRIDAMOLE ER 25/200MG  C] 180 capsule 3    Sig: TAKE ONE CAPSULE BY MOUTH TWICE DAILY     Hematology:  Antiplatelets - aspirin / dipyridamole Failed - 07/01/2022  5:15 PM      Failed - Cr in normal range and within 360 days    Creat  Date Value Ref Range Status  08/26/2020 2.91 (H) 0.60 - 0.88 mg/dL Final    Comment:    For patients >49 years of age, the reference limit for Creatinine is approximately 13% higher for people identified as African-American. .    Creatinine, Ser  Date Value Ref Range Status  09/24/2021 3.10 (H) 0.44 - 1.00 mg/dL Final   Creatinine, Urine  Date Value Ref Range Status  08/09/2015 172.41 mg/dL Final         Failed - Valid encounter within last 12 months    Recent Outpatient Visits           1 year ago Acute midline low back pain with left-sided sciatica   [facility] Valentino Nose, NP   1 year ago Essential hypertension   [facility] Valentino Nose, NP   1 year ago Stage 4 chronic kidney disease (HCC)   [facility] Valentino Nose, NP   2 years ago Essential hypertension   [facility] [place], Velna Hatchet, MD   2 years ago Routine general medical examination at a health care facility   [facility] [place], Velna Hatchet, MD              Passed - eGFR is 10 or above and within 360 days    GFR, Est African American  Date Value Ref Range Status  08/26/2020 16 (L) > OR = 60 mL/min/1.5m2 Final   GFR, Est Non African American  Date Value Ref Range Status  08/26/2020 14 (L) > OR = 60 mL/min/1.61m2 Final   GFR, Estimated  Date Value Ref Range Status  09/24/2021 14 (L) >60 mL/min Final    Comment:    (NOTE) Calculated using the CKD-EPI  Creatinine Equation (2021)          Passed - Patient is not pregnant

## 2022-07-24 DIAGNOSIS — D631 Anemia in chronic kidney disease: Secondary | ICD-10-CM | POA: Diagnosis not present

## 2022-07-24 DIAGNOSIS — R809 Proteinuria, unspecified: Secondary | ICD-10-CM | POA: Diagnosis not present

## 2022-07-24 DIAGNOSIS — N189 Chronic kidney disease, unspecified: Secondary | ICD-10-CM | POA: Diagnosis not present

## 2022-07-24 DIAGNOSIS — N184 Chronic kidney disease, stage 4 (severe): Secondary | ICD-10-CM | POA: Diagnosis not present

## 2022-07-24 DIAGNOSIS — I129 Hypertensive chronic kidney disease with stage 1 through stage 4 chronic kidney disease, or unspecified chronic kidney disease: Secondary | ICD-10-CM | POA: Diagnosis not present

## 2022-07-24 DIAGNOSIS — E211 Secondary hyperparathyroidism, not elsewhere classified: Secondary | ICD-10-CM | POA: Diagnosis not present

## 2022-07-26 ENCOUNTER — Encounter (INDEPENDENT_AMBULATORY_CARE_PROVIDER_SITE_OTHER): Payer: Self-pay | Admitting: *Deleted

## 2022-07-26 DIAGNOSIS — D631 Anemia in chronic kidney disease: Secondary | ICD-10-CM | POA: Diagnosis not present

## 2022-07-26 DIAGNOSIS — D508 Other iron deficiency anemias: Secondary | ICD-10-CM | POA: Diagnosis not present

## 2022-07-26 DIAGNOSIS — R809 Proteinuria, unspecified: Secondary | ICD-10-CM | POA: Diagnosis not present

## 2022-07-26 DIAGNOSIS — N184 Chronic kidney disease, stage 4 (severe): Secondary | ICD-10-CM | POA: Diagnosis not present

## 2022-07-26 DIAGNOSIS — N189 Chronic kidney disease, unspecified: Secondary | ICD-10-CM | POA: Diagnosis not present

## 2022-07-26 DIAGNOSIS — I129 Hypertensive chronic kidney disease with stage 1 through stage 4 chronic kidney disease, or unspecified chronic kidney disease: Secondary | ICD-10-CM | POA: Diagnosis not present

## 2022-07-26 DIAGNOSIS — E211 Secondary hyperparathyroidism, not elsewhere classified: Secondary | ICD-10-CM | POA: Diagnosis not present

## 2022-08-01 ENCOUNTER — Other Ambulatory Visit (INDEPENDENT_AMBULATORY_CARE_PROVIDER_SITE_OTHER): Payer: Medicare HMO

## 2022-08-01 ENCOUNTER — Telehealth: Payer: Self-pay

## 2022-08-01 ENCOUNTER — Ambulatory Visit: Payer: Medicare HMO | Admitting: Physician Assistant

## 2022-08-01 DIAGNOSIS — M1711 Unilateral primary osteoarthritis, right knee: Secondary | ICD-10-CM

## 2022-08-01 DIAGNOSIS — M1712 Unilateral primary osteoarthritis, left knee: Secondary | ICD-10-CM

## 2022-08-01 DIAGNOSIS — M170 Bilateral primary osteoarthritis of knee: Secondary | ICD-10-CM | POA: Diagnosis not present

## 2022-08-01 MED ORDER — LIDOCAINE HCL 1 % IJ SOLN
3.0000 mL | INTRAMUSCULAR | Status: AC | PRN
Start: 2022-08-01 — End: 2022-08-01
  Administered 2022-08-01: 3 mL

## 2022-08-01 MED ORDER — BUPIVACAINE HCL 0.25 % IJ SOLN
0.6600 mL | INTRAMUSCULAR | Status: AC | PRN
Start: 2022-08-01 — End: 2022-08-01
  Administered 2022-08-01: .66 mL via INTRA_ARTICULAR

## 2022-08-01 MED ORDER — METHYLPREDNISOLONE ACETATE 40 MG/ML IJ SUSP
13.3300 mg | INTRAMUSCULAR | Status: AC | PRN
Start: 2022-08-01 — End: 2022-08-01
  Administered 2022-08-01: 13.33 mg via INTRA_ARTICULAR

## 2022-08-01 NOTE — Telephone Encounter (Signed)
Please precert for bilateral gel approval. Dr.Xu's patient. Thanks!

## 2022-08-01 NOTE — Progress Notes (Signed)
Office Visit Note   Patient: [name]           Date of Birth: 1930/11/24           MRN: [id number] Visit Date: 08/01/2022              Requested by: Thana Ates, MD [address] [place],  [place] [address] PCP: Thana Ates, MD   Assessment & Plan: Visit Diagnoses:  1. Bilateral primary osteoarthritis of knee     Plan: Impression is advanced degenerative joint disease both knees with significant valgus deformity.  Today we discussed various treatment options to include repeat cortisone injection versus viscosupplementation injection versus total knee arthroplasty.  She is not interested in surgery.  She would like to repeat cortisone injection today and get approval for viscosupplementation injections.  Follow-up with [place] once approved for Visco injections.  This patient is diagnosed with osteoarthritis of the knee(s).    Radiographs show evidence of joint space narrowing, osteophytes, subchondral sclerosis and/or subchondral cysts.  This patient has knee pain which interferes with functional and activities of daily living.    This patient has experienced inadequate response, adverse effects and/or intolerance with conservative treatments such as acetaminophen, NSAIDS, topical creams, physical therapy or regular exercise, knee bracing and/or weight loss.   This patient has experienced inadequate response or has a contraindication to intra articular steroid injections for at least 3 months.   This patient is not scheduled to have a total knee replacement within 6 months of starting treatment with viscosupplementation.   Follow-Up Instructions: Return for once approved visco injection.   Orders:  Orders Placed This Encounter  Procedures   Large Joint Inj   XR KNEE 3 VIEW LEFT   XR KNEE 3 VIEW RIGHT   No orders of the defined types were placed in this encounter.     Procedures: Large Joint Inj: bilateral knee on 08/01/2022 9:42 AM Indications:  pain Details: 22 G needle, anterolateral approach Medications (Right): 0.66 mL bupivacaine 0.25 %; 3 mL lidocaine 1 %; 13.33 mg methylPREDNISolone acetate 40 MG/ML Medications (Left): 0.66 mL bupivacaine 0.25 %; 3 mL lidocaine 1 %; 13.33 mg methylPREDNISolone acetate 40 MG/ML      Clinical Data: No additional findings.   Subjective: Chief Complaint  Patient presents with   Right Knee - Pain   Left Knee - Pain    HPI patient is a pleasant 89 year old female who comes in today with bilateral knee pain both equally as bad.  She has had symptoms for several years.  She has had intermittent cortisone and viscosupplementation injections all with only temporary relief.  The pain she has is to the entire aspect of both knees worse with walking.  She does get some relief when she is sitting down.  She has tried Tylenol arthritis without significant relief.  Currently not interested in total knee replacement.  Review of Systems as detailed in HPI.  All others reviewed and are negative.   Objective: Vital Signs: There were no vitals taken for this visit.  Physical Exam well-developed well-nourished female no acute distress.  Alert and oriented x 3.  Ortho Exam bilateral knee exam shows valgus deformity.  Range of motion 0 to 100 degrees.  Mild medial lateral joint line tenderness on the left.  Moderate patellofemoral crepitus both sides.  She is neurovascular intact distally.  Specialty Comments:  No specialty comments available.  Imaging: XR KNEE 3 VIEW RIGHT  Result Date:  08/01/2022 Significant lateral and patellofemoral degenerative changes  XR KNEE 3 VIEW LEFT  Result Date: 08/01/2022 Significant lateral and patellofemoral degenerative changes    PMFS History: Patient Active Problem List   Diagnosis Date Noted   UTI (urinary tract infection) 09/24/2021   Mixed hyperlipidemia 09/23/2021   Acute renal failure superimposed on stage 4 chronic kidney disease (HCC) 09/22/2021    Prediabetes 08/29/2020   Fatigue 08/26/2020   Protein-calorie malnutrition (HCC) 04/19/2020   GERD (gastroesophageal reflux disease) 08/12/2019   Bilateral primary osteoarthritis of knee 11/09/2016   History of stroke 06/02/2016   Osteoarthritis 03/25/2014   Anxiety and depression 02/20/2012   Chronic headache 02/20/2012   Peripheral edema 02/19/2012   Hyperparathyroidism (HCC) 10/23/2008   Essential hypertension 10/23/2008   GALLSTONE PANCREATITIS 10/23/2008   Chronic kidney disease 10/23/2008   DEGENERATIVE JOINT DISEASE 10/23/2008   PROTEINURIA 10/23/2008   Past Medical History:  Diagnosis Date   Anxiety    Arthritis    CKD (chronic kidney disease)    Dr. Fausto Skillern   Depression    GERD (gastroesophageal reflux disease)    Hyperlipidemia    Hypertension    Osteoporosis    Stroke [facility])    UTI (lower urinary tract infection)     Family History  Problem Relation Age of Onset   Cancer Mother    Cancer Father     Past Surgical History:  Procedure Laterality Date   CHOLECYSTECTOMY     Social History   Occupational History   Not on file  Tobacco Use   Smoking status: Never   Smokeless tobacco: Never  Substance and Sexual Activity   Alcohol use: No   Drug use: No   Sexual activity: Never

## 2022-08-02 NOTE — Telephone Encounter (Signed)
VOB submitted for Euflexxa, bilateral knee.  

## 2022-08-07 ENCOUNTER — Encounter: Payer: Self-pay | Admitting: Orthopedic Surgery

## 2022-08-07 ENCOUNTER — Ambulatory Visit (INDEPENDENT_AMBULATORY_CARE_PROVIDER_SITE_OTHER): Payer: Medicare HMO | Admitting: Orthopedic Surgery

## 2022-08-07 DIAGNOSIS — B351 Tinea unguium: Secondary | ICD-10-CM | POA: Diagnosis not present

## 2022-08-07 NOTE — Progress Notes (Signed)
Office Visit Note   Patient: [name]           Date of Birth: 1930/04/11           MRN: [id number] Visit Date: 08/07/2022              Requested by: Thana Ates, MD [address] [place],  [place] [address] PCP: Thana Ates, MD  Chief Complaint  Patient presents with   Right Foot - Nail Problem   Left Foot - Nail Problem      HPI: Patient is a 89 year old woman who presents with painful thickened discolored onychomycotic nails x 10.  Assessment & Plan: Visit Diagnoses:  1. Onychomycosis     Plan: Nails were trimmed x 10 without complication.  Reevaluate in 3 months.  Follow-Up Instructions: Return in about 3 months (around 11/07/2022).   Ortho Exam  Patient is alert, oriented, no adenopathy, well-dressed, normal affect, normal respiratory effort. Examination patient has extremely long thickened discolored onychomycotic nails she is unable to safely trim the nails on her own and the nails were trimmed x 10 without complication.  Imaging: No results found. No images are attached to the encounter.  Labs: Lab Results  Component Value Date   HGBA1C 6.1 (H) 08/26/2020   HGBA1C 5.7 (H) 08/12/2019   HGBA1C 5.7 (H) 02/02/2016   ESRSEDRATE 78 (H) 10/04/2007   REPTSTATUS 09/25/2021 FINAL 09/23/2021   CULT >=100,000 COLONIES/mL ESCHERICHIA COLI (A) 09/23/2021   LABORGA ESCHERICHIA COLI (A) 09/23/2021     Lab Results  Component Value Date   ALBUMIN 3.2 (L) 09/23/2021   ALBUMIN 3.9 07/29/2021   ALBUMIN 3.8 10/25/2016    Lab Results  Component Value Date   MG 1.8 09/24/2021   MG 1.6 (L) 09/23/2021   MG 1.9 10/03/2007   Lab Results  Component Value Date   VD25OH 62 08/12/2019   VD25OH 54.3 08/09/2015   VD25OH 59 06/16/2013    No results found for: "PREALBUMIN"    Latest Ref Rng & Units 09/23/2021    4:04 AM 09/22/2021    5:53 PM 07/29/2021    9:24 PM  CBC EXTENDED  WBC 4.0 - 10.5 K/uL 5.8  6.6    RBC 3.87 - 5.11 MIL/uL 3.19  3.45     Hemoglobin 12.0 - 15.0 g/dL 19.1  47.8  29.5   HCT 36.0 - 46.0 % 33.5  36.4  38.0   Platelets 150 - 400 K/uL 157  169    NEUT# 1.7 - 7.7 K/uL  4.0    Lymph# 0.7 - 4.0 K/uL  1.7       There is no height or weight on file to calculate BMI.  Orders:  No orders of the defined types were placed in this encounter.  No orders of the defined types were placed in this encounter.    Procedures: No procedures performed  Clinical Data: No additional findings.  ROS:  All other systems negative, except as noted in the HPI. Review of Systems  Objective: Vital Signs: There were no vitals taken for this visit.  Specialty Comments:  No specialty comments available.  PMFS History: Patient Active Problem List   Diagnosis Date Noted   UTI (urinary tract infection) 09/24/2021   Mixed hyperlipidemia 09/23/2021   Acute renal failure superimposed on stage 4 chronic kidney disease (HCC) 09/22/2021   Prediabetes 08/29/2020   Fatigue 08/26/2020   Protein-calorie malnutrition (HCC) 04/19/2020   GERD (gastroesophageal reflux disease)  08/12/2019   Bilateral primary osteoarthritis of knee 11/09/2016   History of stroke 06/02/2016   Osteoarthritis 03/25/2014   Anxiety and depression 02/20/2012   Chronic headache 02/20/2012   Peripheral edema 02/19/2012   Hyperparathyroidism (HCC) 10/23/2008   Essential hypertension 10/23/2008   GALLSTONE PANCREATITIS 10/23/2008   Chronic kidney disease 10/23/2008   DEGENERATIVE JOINT DISEASE 10/23/2008   PROTEINURIA 10/23/2008   Past Medical History:  Diagnosis Date   Anxiety    Arthritis    CKD (chronic kidney disease)    Dr. Fausto Skillern   Depression    GERD (gastroesophageal reflux disease)    Hyperlipidemia    Hypertension    Osteoporosis    Stroke [facility])    UTI (lower urinary tract infection)     Family History  Problem Relation Age of Onset   Cancer Mother    Cancer Father     Past Surgical History:  Procedure Laterality Date    CHOLECYSTECTOMY     Social History   Occupational History   Not on file  Tobacco Use   Smoking status: Never   Smokeless tobacco: Never  Substance and Sexual Activity   Alcohol use: No   Drug use: No   Sexual activity: Never

## 2022-08-09 ENCOUNTER — Telehealth: Payer: Self-pay

## 2022-08-09 NOTE — Telephone Encounter (Signed)
PA has been started through euflexxa portal for euflexxa, bilateral knee. PA pending

## 2022-08-22 ENCOUNTER — Ambulatory Visit (INDEPENDENT_AMBULATORY_CARE_PROVIDER_SITE_OTHER): Payer: Medicare HMO | Admitting: Gastroenterology

## 2022-08-22 ENCOUNTER — Encounter (INDEPENDENT_AMBULATORY_CARE_PROVIDER_SITE_OTHER): Payer: Self-pay | Admitting: Gastroenterology

## 2022-08-22 VITALS — BP 149/77 | HR 70 | Temp 97.5°F | Ht <= 58 in | Wt 138.6 lb

## 2022-08-22 DIAGNOSIS — D509 Iron deficiency anemia, unspecified: Secondary | ICD-10-CM

## 2022-08-22 NOTE — Progress Notes (Addendum)
Referring Provider: Thana Ates, MD Primary Care Physician:  Thana Ates, MD Primary GI Physician: new   Chief Complaint  Patient presents with   IDA    Patient here today due to being referred by Dr. Wolfgang Phoenix for IDA. Patient says she has seen darker stools,but she is taking po Ferrous sulfate once per day. She denies any shortness of breath,no sight of bright red blood, no dizziness, no chest pain, no fatigue. Last hgb was drawn on 07/24/22 hgb was 10.0 at the time and fe sat was 10%. Patient has history of CKD.   HPI:   [name] is a 89 y.o. female with past medical history of anxiety, arthritis, CKD, depression, GERD, HLD, HTN, Osteoporosis, Stroke  Patient presenting today as a new patient for IDA.  Iron panel iron 24, TIBC 232, iron sat 10%, ferritin 95 in April  Hgb 10   Appears patient has history of IDA off and on for many years   Present: Patient states she thinks IDA has been ongoing for the past few weeks. She notes that when she is anemic her eyes bother her and she gets a headache that comes and goes. She has some peeling on the inside of her mouth as well. She denies feeling SOB, dizziness or fatigue. Denies any rectal bleeding or melena, stools are darker on PO iron. She denies any abdominal pain. No nausea or vomiting. She endorses some decline in appetite and small amount of weight loss over the past couple of weeks. Family reports she was 141 lbs on 5/1 (started on iron at this time as well), today is 138 lbs. Denies early satiety.  She does not take any NSAIDs. Denies issues with GERD as long a she takes low dose omeprazole. She endorses some newer constipation for the past 2 weeks. She notes she feels like she needs to use the restroom more often than previously but having difficulty with actually defecating. She does not take anything for constipation.   She is seeing Dr. Wolfgang Phoenix again 8/1.  NSAID ZOX:WRUE  Social hx: no etoh or tobacco  Fam hx: unsure    Last Colonoscopy:2011? diverticulosis Last Endoscopy: June 2009patchy erythema.  Biopsies of the small intestine showed no  evidence of celiac sprue.  Her gastric biopsy showed reactive  gastropathy.   Recommendations:    Past Medical History:  Diagnosis Date   Anxiety    Arthritis    CKD (chronic kidney disease)    Dr. Fausto Skillern   Depression    GERD (gastroesophageal reflux disease)    Hyperlipidemia    Hypertension    Osteoporosis    Stroke [facility])    UTI (lower urinary tract infection)     Past Surgical History:  Procedure Laterality Date   CHOLECYSTECTOMY      Current Outpatient Medications  Medication Sig Dispense Refill   acetaminophen (TYLENOL) 650 MG CR tablet Take 650 mg by mouth every 8 (eight) hours as needed for pain.     atorvastatin (LIPITOR) 20 MG tablet Take 1 tablet (20 mg total) by mouth daily. 90 tablet 1   calcitRIOL (ROCALTROL) 0.25 MCG capsule Take 0.25 mcg by mouth daily.     Cranberry-Vitamin C-Vitamin E (CRANBERRY PLUS VITAMIN C) 4200-20-3 MG-MG-UNIT CAPS Take by mouth.     dipyridamole-aspirin (AGGRENOX) 200-25 MG 12hr capsule Take 1 capsule by mouth 2 (two) times daily. 180 capsule 3   ferrous sulfate 325 (65 FE) MG EC tablet Take 325 mg by mouth  daily with breakfast.     labetalol (NORMODYNE) 100 MG tablet Take 1 tablet (100 mg total) by mouth 2 (two) times daily. 180 tablet 0   Omega-3 Fatty Acids (OMEGA-3 FISH OIL PO) Take by mouth.     omeprazole (PRILOSEC) 20 MG capsule TAKE 1 CAPSULE(20 MG) BY MOUTH DAILY 90 capsule 2   sodium bicarbonate 650 MG tablet Take 650 mg by mouth 2 (two) times daily.     traMADol (ULTRAM) 50 MG tablet Take 1 tablet (50 mg total) by mouth daily as needed. 90 tablet 0   No current facility-administered medications for this visit.    Allergies as of 08/22/2022 - Review Complete 08/22/2022  Allergen Reaction Noted   Ciprofloxacin     Diphenhydramine hcl     Sulfonamide derivatives Rash     Family History   Problem Relation Age of Onset   Cancer Mother    Cancer Father     Social History   Socioeconomic History   Marital status: Widowed    Spouse name: Not on file   Number of children: Not on file   Years of education: Not on file   Highest education level: Not on file  Occupational History   Not on file  Tobacco Use   Smoking status: Never   Smokeless tobacco: Never  Vaping Use   Vaping Use: Never used  Substance and Sexual Activity   Alcohol use: No   Drug use: No   Sexual activity: Never  Other Topics Concern   Not on file  Social History Narrative   Not on file   Social Determinants of Health   Financial Resource Strain: Not on file  Food Insecurity: Not on file  Transportation Needs: Not on file  Physical Activity: Not on file  Stress: Not on file  Social Connections: Not on file   Review of systems General: negative for malaise, night sweats, fever, chills, weight loss Neck: Negative for lumps, goiter, pain and significant neck swelling Resp: Negative for cough, wheezing, dyspnea at rest CV: Negative for chest pain, leg swelling, palpitations, orthopnea GI: denies melena, hematochezia, nausea, vomiting, diarrhea, dysphagia, odyonophagia, early satiety or unintentional weight loss. +constipation MSK: Negative for joint pain or swelling, back pain, and muscle pain. Derm: Negative for itching or rash Psych: Denies depression, anxiety, memory loss, confusion. No homicidal or suicidal ideation.  Heme: Negative for prolonged bleeding, bruising easily, and swollen nodes. Endocrine: Negative for cold or heat intolerance, polyuria, polydipsia and goiter. Neuro: negative for tremor, gait imbalance, syncope and seizures. The remainder of the review of systems is noncontributory.  Physical Exam: BP (!) 149/77 (BP Location: Left Arm, Patient Position: Sitting, Cuff Size: Large)   Pulse 70   Temp (!) 97.5 F (36.4 C) (Temporal)   Ht 4\' 9"  (1.448 m)   Wt 138 lb 9.6 oz  (62.9 kg)   BMI 29.99 kg/m  General:   Alert and oriented. No distress noted. Pleasant and cooperative. In wheelchair  Head:  Normocephalic and atraumatic. Eyes:  Conjuctiva clear without scleral icterus. Mouth:  Oral mucosa pink and moist. Good dentition. No lesions. Heart: Normal rate and rhythm, s1 and s2 heart sounds present.  Lungs: Clear lung sounds in all lobes. Respirations equal and unlabored. Abdomen:  +BS, soft, non-tender and non-distended. No rebound or guarding. No HSM or masses noted. Derm: No palmar erythema or jaundice Msk:  Symmetrical without gross deformities. Normal posture. Extremities:  Without edema. Neurologic:  Alert and  oriented x4 Psych:  Alert and cooperative. Normal mood and affect.  Invalid input(s): "6 MONTHS"   ASSESSMENT: AVAA DITOMASO is a 89 y.o. female presenting today as a new patient for IDA.  Appears patient has history of IDA off and on for many years with last evaluation via colonoscopy and EGD appearing to be 2009-2011 time frame? Per chart review. Iron panel iron 24, TIBC 232, iron sat 10%, ferritin 95, Hgb 10 at the end of April. Patient started on PO iron on 5/1. Denies sob, dizziness, fatigue. Having some constipation and decreased appetite since starting iron but denies abdominal pain, nausea, vomiting, early satiety. She does not take NSAIDs. Etiology of IDA is unclear, certainly could be some aspect of her CKD, long term PPI use? I did discuss endoscopic evaluation (EGD/Colonoscopy) with the patient and family, to include indications, risks and benefits, however, it is possible that risks of this procedure would outweight the benefits considering patient's age and multi morbidities. Given this consideration of avoiding endoscopic evaluations, I also  cannot ultimately rule out potential for presence of something such as malignancy as cause of the patient's symptoms, I discussed this at length with the patient and family who verbalized  understanding. Ultimately patient stated she does not wish to undergo any endoscopic evaluations at this time. For now will continue with PO iron, will check fecal occult stool cards x3 and can discuss further evaluation once these have results. She has upcoming labs with Dr Wolfgang Phoenix on 8/1.   In regards to constipation, suspect this is secondary to starting Iron therapy. Advised to Start taking Miralax 1 capful every day for one week. If bowel movements do not improve, increase to 1 capful every 12 hours. If after two weeks there is no improvement, increase to 1 capful every 8 hours   PLAN:  Fecal occult stool cards x3  2. Further discussion on EGD/Colonoscopy pending stool card results  3. Could consider Capsule study 4. Continue PO iron therapy 5. Has repeat Iron studies in early August with Dr. Wolfgang Phoenix 6. Start miralax  All questions were answered, patient verbalized understanding and is in agreement with plan as outlined above.   Follow Up: 3 months   Kensly Bowmer L. Jeanmarie Hubert, MSN, APRN, AGNP-C Adult-Gerontology Nurse Practitioner [facility]  I have reviewed the note and agree with the APP's assessment as described in this progress note  Katrinka Blazing, MD Gastroenterology and Hepatology [facility]

## 2022-08-22 NOTE — Patient Instructions (Signed)
As discussed, we will check stool cards to evaluate for presence of blood in your stools. At your age, EGD and Colonoscopy do pose somewhat higher risk, however, we ultimately cannot rule out other causes of GI blood loss. I will call you to discuss further once stool cards are resulted. Please continue with iron pill daily for now.  For constipation, Start taking Miralax 1 capful every day for one week. If bowel movements do not improve, increase to 1 capful every 12 hours. If after two weeks there is no improvement, increase to 1 capful every 8 hours   Follow up 3 months

## 2022-08-30 ENCOUNTER — Other Ambulatory Visit (INDEPENDENT_AMBULATORY_CARE_PROVIDER_SITE_OTHER): Payer: Self-pay | Admitting: *Deleted

## 2022-08-30 DIAGNOSIS — D649 Anemia, unspecified: Secondary | ICD-10-CM

## 2022-08-30 LAB — POC HEMOCCULT BLD/STL (HOME/3-CARD/SCREEN)
Card #2 Fecal Occult Blod, POC: NEGATIVE
Card #3 Fecal Occult Blood, POC: NEGATIVE
Fecal Occult Blood, POC: NEGATIVE

## 2022-09-26 ENCOUNTER — Ambulatory Visit (INDEPENDENT_AMBULATORY_CARE_PROVIDER_SITE_OTHER): Payer: Medicare HMO | Admitting: Gastroenterology

## 2022-10-23 DIAGNOSIS — Z1331 Encounter for screening for depression: Secondary | ICD-10-CM | POA: Diagnosis not present

## 2022-10-23 DIAGNOSIS — N2581 Secondary hyperparathyroidism of renal origin: Secondary | ICD-10-CM | POA: Diagnosis not present

## 2022-10-23 DIAGNOSIS — I10 Essential (primary) hypertension: Secondary | ICD-10-CM | POA: Diagnosis not present

## 2022-10-23 DIAGNOSIS — N184 Chronic kidney disease, stage 4 (severe): Secondary | ICD-10-CM | POA: Diagnosis not present

## 2022-10-23 DIAGNOSIS — E7849 Other hyperlipidemia: Secondary | ICD-10-CM | POA: Diagnosis not present

## 2022-10-23 DIAGNOSIS — M170 Bilateral primary osteoarthritis of knee: Secondary | ICD-10-CM | POA: Diagnosis not present

## 2022-10-23 DIAGNOSIS — R7303 Prediabetes: Secondary | ICD-10-CM | POA: Diagnosis not present

## 2022-10-23 DIAGNOSIS — I700 Atherosclerosis of aorta: Secondary | ICD-10-CM | POA: Diagnosis not present

## 2022-10-23 DIAGNOSIS — Z0000 Encounter for general adult medical examination without abnormal findings: Secondary | ICD-10-CM | POA: Diagnosis not present

## 2022-10-26 DIAGNOSIS — R7303 Prediabetes: Secondary | ICD-10-CM | POA: Diagnosis not present

## 2022-10-26 DIAGNOSIS — E7849 Other hyperlipidemia: Secondary | ICD-10-CM | POA: Diagnosis not present

## 2022-10-30 DIAGNOSIS — I129 Hypertensive chronic kidney disease with stage 1 through stage 4 chronic kidney disease, or unspecified chronic kidney disease: Secondary | ICD-10-CM | POA: Diagnosis not present

## 2022-10-30 DIAGNOSIS — D631 Anemia in chronic kidney disease: Secondary | ICD-10-CM | POA: Diagnosis not present

## 2022-10-30 DIAGNOSIS — N184 Chronic kidney disease, stage 4 (severe): Secondary | ICD-10-CM | POA: Diagnosis not present

## 2022-10-30 DIAGNOSIS — R809 Proteinuria, unspecified: Secondary | ICD-10-CM | POA: Diagnosis not present

## 2022-11-01 DIAGNOSIS — D631 Anemia in chronic kidney disease: Secondary | ICD-10-CM | POA: Diagnosis not present

## 2022-11-01 DIAGNOSIS — I129 Hypertensive chronic kidney disease with stage 1 through stage 4 chronic kidney disease, or unspecified chronic kidney disease: Secondary | ICD-10-CM | POA: Diagnosis not present

## 2022-11-01 DIAGNOSIS — D509 Iron deficiency anemia, unspecified: Secondary | ICD-10-CM | POA: Diagnosis not present

## 2022-11-01 DIAGNOSIS — N184 Chronic kidney disease, stage 4 (severe): Secondary | ICD-10-CM | POA: Diagnosis not present

## 2022-11-02 DIAGNOSIS — H43813 Vitreous degeneration, bilateral: Secondary | ICD-10-CM | POA: Diagnosis not present

## 2022-11-02 DIAGNOSIS — H25813 Combined forms of age-related cataract, bilateral: Secondary | ICD-10-CM | POA: Diagnosis not present

## 2022-11-02 DIAGNOSIS — H5201 Hypermetropia, right eye: Secondary | ICD-10-CM | POA: Diagnosis not present

## 2022-11-02 DIAGNOSIS — H5212 Myopia, left eye: Secondary | ICD-10-CM | POA: Diagnosis not present

## 2022-11-02 DIAGNOSIS — H524 Presbyopia: Secondary | ICD-10-CM | POA: Diagnosis not present

## 2022-11-02 DIAGNOSIS — H04123 Dry eye syndrome of bilateral lacrimal glands: Secondary | ICD-10-CM | POA: Diagnosis not present

## 2022-11-02 DIAGNOSIS — H35033 Hypertensive retinopathy, bilateral: Secondary | ICD-10-CM | POA: Diagnosis not present

## 2022-11-07 ENCOUNTER — Ambulatory Visit: Payer: Medicare HMO | Admitting: Orthopedic Surgery

## 2022-11-07 DIAGNOSIS — B351 Tinea unguium: Secondary | ICD-10-CM

## 2022-11-08 ENCOUNTER — Encounter: Payer: Self-pay | Admitting: Orthopedic Surgery

## 2022-11-08 NOTE — Progress Notes (Signed)
Office Visit Note   Patient: [name]           Date of Birth: Nov 14, 1930           MRN: [id number] Visit Date: 11/07/2022              Requested by: Thana Ates, MD [address] [place],  [place] [address] PCP: Thana Ates, MD  Chief Complaint  Patient presents with   Right Foot - Follow-up   Left Foot - Follow-up      HPI: Patient is a 89 year old woman who presents for evaluation of both lower extremities.  Assessment & Plan: Visit Diagnoses:  1. Onychomycosis     Plan: Nails were trimmed x 10 no venous or plantar ulcers.  Follow-Up Instructions: Return in about 3 months (around 02/07/2023).   Ortho Exam  Patient is alert, oriented, no adenopathy, well-dressed, normal affect, normal respiratory effort. Examination patient does have swelling of lower extremities but no open ulcers.  There are no plantar ulcers on either foot.  She does have thickened discolored onychomycotic nails x 10 she is unable to safely trim the nails on her own and the nails were trimmed x 10 without complication.  Imaging: No results found. No images are attached to the encounter.  Labs: Lab Results  Component Value Date   HGBA1C 6.1 (H) 08/26/2020   HGBA1C 5.7 (H) 08/12/2019   HGBA1C 5.7 (H) 02/02/2016   ESRSEDRATE 78 (H) 10/04/2007   REPTSTATUS 09/25/2021 FINAL 09/23/2021   CULT >=100,000 COLONIES/mL ESCHERICHIA COLI (A) 09/23/2021   LABORGA ESCHERICHIA COLI (A) 09/23/2021     Lab Results  Component Value Date   ALBUMIN 3.2 (L) 09/23/2021   ALBUMIN 3.9 07/29/2021   ALBUMIN 3.8 10/25/2016    Lab Results  Component Value Date   MG 1.8 09/24/2021   MG 1.6 (L) 09/23/2021   MG 1.9 10/03/2007   Lab Results  Component Value Date   VD25OH 62 08/12/2019   VD25OH 54.3 08/09/2015   VD25OH 59 06/16/2013    No results found for: "PREALBUMIN"    Latest Ref Rng & Units 09/23/2021    4:04 AM 09/22/2021    5:53 PM 07/29/2021    9:24 PM  CBC EXTENDED   WBC 4.0 - 10.5 K/uL 5.8  6.6    RBC 3.87 - 5.11 MIL/uL 3.19  3.45    Hemoglobin 12.0 - 15.0 g/dL 19.1  47.8  29.5   HCT 36.0 - 46.0 % 33.5  36.4  38.0   Platelets 150 - 400 K/uL 157  169    NEUT# 1.7 - 7.7 K/uL  4.0    Lymph# 0.7 - 4.0 K/uL  1.7       There is no height or weight on file to calculate BMI.  Orders:  No orders of the defined types were placed in this encounter.  No orders of the defined types were placed in this encounter.    Procedures: No procedures performed  Clinical Data: No additional findings.  ROS:  All other systems negative, except as noted in the HPI. Review of Systems  Objective: Vital Signs: There were no vitals taken for this visit.  Specialty Comments:  No specialty comments available.  PMFS History: Patient Active Problem List   Diagnosis Date Noted   Iron deficiency anemia 08/22/2022   UTI (urinary tract infection) 09/24/2021   Mixed hyperlipidemia 09/23/2021   Acute renal failure superimposed on stage 4 chronic kidney disease (  HCC) 09/22/2021   Prediabetes 08/29/2020   Fatigue 08/26/2020   Protein-calorie malnutrition (HCC) 04/19/2020   GERD (gastroesophageal reflux disease) 08/12/2019   Bilateral primary osteoarthritis of knee 11/09/2016   History of stroke 06/02/2016   Osteoarthritis 03/25/2014   Anxiety and depression 02/20/2012   Chronic headache 02/20/2012   Peripheral edema 02/19/2012   Hyperparathyroidism (HCC) 10/23/2008   Essential hypertension 10/23/2008   GALLSTONE PANCREATITIS 10/23/2008   Chronic kidney disease 10/23/2008   DEGENERATIVE JOINT DISEASE 10/23/2008   PROTEINURIA 10/23/2008   Past Medical History:  Diagnosis Date   Anxiety    Arthritis    CKD (chronic kidney disease)    Dr. Fausto Skillern   Depression    GERD (gastroesophageal reflux disease)    Hyperlipidemia    Hypertension    Osteoporosis    Stroke [facility])    UTI (lower urinary tract infection)     Family History  Problem Relation Age of  Onset   Cancer Mother    Cancer Father     Past Surgical History:  Procedure Laterality Date   CHOLECYSTECTOMY     Social History   Occupational History   Not on file  Tobacco Use   Smoking status: Never   Smokeless tobacco: Never  Vaping Use   Vaping status: Never Used  Substance and Sexual Activity   Alcohol use: No   Drug use: No   Sexual activity: Never

## 2022-11-09 ENCOUNTER — Other Ambulatory Visit: Payer: Self-pay

## 2022-11-09 DIAGNOSIS — M170 Bilateral primary osteoarthritis of knee: Secondary | ICD-10-CM

## 2022-11-17 ENCOUNTER — Ambulatory Visit: Payer: Medicare HMO | Admitting: Orthopaedic Surgery

## 2022-11-17 DIAGNOSIS — M170 Bilateral primary osteoarthritis of knee: Secondary | ICD-10-CM

## 2022-11-17 MED ORDER — SODIUM HYALURONATE (VISCOSUP) 20 MG/2ML IX SOSY
20.0000 mg | PREFILLED_SYRINGE | INTRA_ARTICULAR | Status: AC | PRN
Start: 2022-11-17 — End: 2022-11-17
  Administered 2022-11-17: 20 mg via INTRA_ARTICULAR

## 2022-11-17 NOTE — Progress Notes (Signed)
   Office Visit Note   Patient: [name]           Date of Birth: Feb 13, 1931           MRN: [id number] Visit Date: 11/17/2022              Requested by: Thana Ates, MD [address] [place],  [place] [address] PCP: Thana Ates, MD   Assessment & Plan: Visit Diagnoses:  1. Bilateral primary osteoarthritis of knee     Plan: Ms. Deloza comes in today for first round of bilateral knee Euflexxa injections.  She tolerated the injections well.  Will see her back next week for second round.  Follow-Up Instructions: Return in about 1 week (around 11/24/2022) for with lindsey.   Orders:  No orders of the defined types were placed in this encounter.  No orders of the defined types were placed in this encounter.     Procedures: Large Joint Inj: bilateral knee on 11/17/2022 9:57 AM Indications: pain Details: 22 G needle  Arthrogram: No  Medications (Right): 20 mg Sodium Hyaluronate (Viscosup) 20 MG/2ML Medications (Left): 20 mg Sodium Hyaluronate (Viscosup) 20 MG/2ML Outcome: tolerated well, no immediate complications Patient was prepped and draped in the usual sterile fashion.       Clinical Data: No additional findings.   Subjective: Chief Complaint  Patient presents with   Right Knee - Pain   Left Knee - Pain    HPI  Review of Systems   Objective: Vital Signs: There were no vitals taken for this visit.  Physical Exam  Ortho Exam  Specialty Comments:  No specialty comments available.  Imaging: No results found.   PMFS History: Patient Active Problem List   Diagnosis Date Noted   Iron deficiency anemia 08/22/2022   UTI (urinary tract infection) 09/24/2021   Mixed hyperlipidemia 09/23/2021   Acute renal failure superimposed on stage 4 chronic kidney disease (HCC) 09/22/2021   Prediabetes 08/29/2020   Fatigue 08/26/2020   Protein-calorie malnutrition (HCC) 04/19/2020   GERD (gastroesophageal reflux disease) 08/12/2019    Bilateral primary osteoarthritis of knee 11/09/2016   History of stroke 06/02/2016   Osteoarthritis 03/25/2014   Anxiety and depression 02/20/2012   Chronic headache 02/20/2012   Peripheral edema 02/19/2012   Hyperparathyroidism (HCC) 10/23/2008   Essential hypertension 10/23/2008   GALLSTONE PANCREATITIS 10/23/2008   Chronic kidney disease 10/23/2008   DEGENERATIVE JOINT DISEASE 10/23/2008   PROTEINURIA 10/23/2008   Past Medical History:  Diagnosis Date   Anxiety    Arthritis    CKD (chronic kidney disease)    Dr. Fausto Skillern   Depression    GERD (gastroesophageal reflux disease)    Hyperlipidemia    Hypertension    Osteoporosis    Stroke [facility])    UTI (lower urinary tract infection)     Family History  Problem Relation Age of Onset   Cancer Mother    Cancer Father     Past Surgical History:  Procedure Laterality Date   CHOLECYSTECTOMY     Social History   Occupational History   Not on file  Tobacco Use   Smoking status: Never   Smokeless tobacco: Never  Vaping Use   Vaping status: Never Used  Substance and Sexual Activity   Alcohol use: No   Drug use: No   Sexual activity: Never

## 2022-11-24 ENCOUNTER — Ambulatory Visit: Payer: Medicare HMO | Admitting: Orthopaedic Surgery

## 2022-11-24 DIAGNOSIS — M170 Bilateral primary osteoarthritis of knee: Secondary | ICD-10-CM

## 2022-11-24 MED ORDER — SODIUM HYALURONATE (VISCOSUP) 20 MG/2ML IX SOSY
20.0000 mg | PREFILLED_SYRINGE | INTRA_ARTICULAR | Status: AC | PRN
Start: 2022-11-24 — End: 2022-11-24
  Administered 2022-11-24: 20 mg via INTRA_ARTICULAR

## 2022-11-24 NOTE — Progress Notes (Signed)
   Office Visit Note   Patient: [name]           Date of Birth: 05-06-1930           MRN: [id number] Visit Date: 11/24/2022              Requested by: Thana Ates, MD [address] [place],  [place] [address] PCP: Thana Ates, MD   Assessment & Plan: Visit Diagnoses:  1. Bilateral primary osteoarthritis of knee     Plan: Patient underwent second round of Euflexxa injections today.  Follow-Up Instructions: No follow-ups on file.   Orders:  No orders of the defined types were placed in this encounter.  No orders of the defined types were placed in this encounter.     Procedures: Large Joint Inj: bilateral knee on 11/24/2022 10:51 AM Indications: pain Details: 22 G needle  Arthrogram: No  Medications (Right): 20 mg Sodium Hyaluronate (Viscosup) 20 MG/2ML Medications (Left): 20 mg Sodium Hyaluronate (Viscosup) 20 MG/2ML Outcome: tolerated well, no immediate complications Patient was prepped and draped in the usual sterile fashion.       Clinical Data: No additional findings.   Subjective: Chief Complaint  Patient presents with   Left Knee - Pain   Right Knee - Pain    HPI  Review of Systems   Objective: Vital Signs: There were no vitals taken for this visit.  Physical Exam  Ortho Exam  Specialty Comments:  No specialty comments available.  Imaging: No results found.   PMFS History: Patient Active Problem List   Diagnosis Date Noted   Iron deficiency anemia 08/22/2022   UTI (urinary tract infection) 09/24/2021   Mixed hyperlipidemia 09/23/2021   Acute renal failure superimposed on stage 4 chronic kidney disease (HCC) 09/22/2021   Prediabetes 08/29/2020   Fatigue 08/26/2020   Protein-calorie malnutrition (HCC) 04/19/2020   GERD (gastroesophageal reflux disease) 08/12/2019   Bilateral primary osteoarthritis of knee 11/09/2016   History of stroke 06/02/2016   Osteoarthritis 03/25/2014   Anxiety and depression  02/20/2012   Chronic headache 02/20/2012   Peripheral edema 02/19/2012   Hyperparathyroidism (HCC) 10/23/2008   Essential hypertension 10/23/2008   GALLSTONE PANCREATITIS 10/23/2008   Chronic kidney disease 10/23/2008   DEGENERATIVE JOINT DISEASE 10/23/2008   PROTEINURIA 10/23/2008   Past Medical History:  Diagnosis Date   Anxiety    Arthritis    CKD (chronic kidney disease)    Dr. Fausto Skillern   Depression    GERD (gastroesophageal reflux disease)    Hyperlipidemia    Hypertension    Osteoporosis    Stroke [facility])    UTI (lower urinary tract infection)     Family History  Problem Relation Age of Onset   Cancer Mother    Cancer Father     Past Surgical History:  Procedure Laterality Date   CHOLECYSTECTOMY     Social History   Occupational History   Not on file  Tobacco Use   Smoking status: Never   Smokeless tobacco: Never  Vaping Use   Vaping status: Never Used  Substance and Sexual Activity   Alcohol use: No   Drug use: No   Sexual activity: Never

## 2022-12-01 ENCOUNTER — Encounter: Payer: Self-pay | Admitting: Orthopaedic Surgery

## 2022-12-01 ENCOUNTER — Ambulatory Visit: Payer: Medicare HMO | Admitting: Orthopaedic Surgery

## 2022-12-01 DIAGNOSIS — M170 Bilateral primary osteoarthritis of knee: Secondary | ICD-10-CM | POA: Diagnosis not present

## 2022-12-01 MED ORDER — SODIUM HYALURONATE (VISCOSUP) 20 MG/2ML IX SOSY
20.0000 mg | PREFILLED_SYRINGE | INTRA_ARTICULAR | Status: AC | PRN
Start: 2022-12-01 — End: 2022-12-01
  Administered 2022-12-01: 20 mg via INTRA_ARTICULAR

## 2022-12-01 NOTE — Progress Notes (Signed)
   Office Visit Note   Patient: [name]           Date of Birth: 08-28-1930           MRN: [id number] Visit Date: 12/01/2022              Requested by: Thana Ates, MD [address] [place],  [place] [address] PCP: Thana Ates, MD   Assessment & Plan: Visit Diagnoses:  1. Bilateral primary osteoarthritis of knee     Plan: Briceida underwent third round of Euflexxa injections today.  She tolerated these well.  Follow-up as needed.  Follow-Up Instructions: No follow-ups on file.   Orders:  No orders of the defined types were placed in this encounter.  No orders of the defined types were placed in this encounter.     Procedures: Large Joint Inj: bilateral knee on 12/01/2022 10:27 AM Indications: pain Details: 22 G needle  Arthrogram: No  Medications (Right): 20 mg Sodium Hyaluronate (Viscosup) 20 MG/2ML Medications (Left): 20 mg Sodium Hyaluronate (Viscosup) 20 MG/2ML Outcome: tolerated well, no immediate complications Patient was prepped and draped in the usual sterile fashion.       Clinical Data: No additional findings.   Subjective: Chief Complaint  Patient presents with   Right Knee - Follow-up    Euflexxa #3   Left Knee - Follow-up    Euflexxa #3    HPI  Review of Systems   Objective: Vital Signs: There were no vitals taken for this visit.  Physical Exam  Ortho Exam  Specialty Comments:  No specialty comments available.  Imaging: No results found.   PMFS History: Patient Active Problem List   Diagnosis Date Noted   Iron deficiency anemia 08/22/2022   UTI (urinary tract infection) 09/24/2021   Mixed hyperlipidemia 09/23/2021   Acute renal failure superimposed on stage 4 chronic kidney disease (HCC) 09/22/2021   Prediabetes 08/29/2020   Fatigue 08/26/2020   Protein-calorie malnutrition (HCC) 04/19/2020   GERD (gastroesophageal reflux disease) 08/12/2019   Bilateral primary osteoarthritis of knee 11/09/2016    History of stroke 06/02/2016   Osteoarthritis 03/25/2014   Anxiety and depression 02/20/2012   Chronic headache 02/20/2012   Peripheral edema 02/19/2012   Hyperparathyroidism (HCC) 10/23/2008   Essential hypertension 10/23/2008   GALLSTONE PANCREATITIS 10/23/2008   Chronic kidney disease 10/23/2008   DEGENERATIVE JOINT DISEASE 10/23/2008   PROTEINURIA 10/23/2008   Past Medical History:  Diagnosis Date   Anxiety    Arthritis    CKD (chronic kidney disease)    Dr. Fausto Skillern   Depression    GERD (gastroesophageal reflux disease)    Hyperlipidemia    Hypertension    Osteoporosis    Stroke [facility])    UTI (lower urinary tract infection)     Family History  Problem Relation Age of Onset   Cancer Mother    Cancer Father     Past Surgical History:  Procedure Laterality Date   CHOLECYSTECTOMY     Social History   Occupational History   Not on file  Tobacco Use   Smoking status: Never   Smokeless tobacco: Never  Vaping Use   Vaping status: Never Used  Substance and Sexual Activity   Alcohol use: No   Drug use: No   Sexual activity: Never

## 2022-12-19 ENCOUNTER — Ambulatory Visit (INDEPENDENT_AMBULATORY_CARE_PROVIDER_SITE_OTHER): Payer: Medicare HMO | Admitting: Gastroenterology

## 2022-12-19 ENCOUNTER — Encounter (INDEPENDENT_AMBULATORY_CARE_PROVIDER_SITE_OTHER): Payer: Self-pay | Admitting: Gastroenterology

## 2022-12-19 VITALS — BP 144/80 | Temp 97.9°F | Ht <= 58 in | Wt 138.2 lb

## 2022-12-19 DIAGNOSIS — D509 Iron deficiency anemia, unspecified: Secondary | ICD-10-CM | POA: Diagnosis not present

## 2022-12-19 NOTE — Patient Instructions (Signed)
Please continue taking daily iron I will get most recent iron studies from Dr. Wolfgang Phoenix, however, given you have any no symptoms of GI bleeding, your stool testing was negative and considering age and other health issues, as we discussed, we will hold off on any procedures at this time. We will plan to see you on as needed basis, if Dr. Wolfgang Phoenix feels that you need to be seen by [place] again for decreasing iron levels, or if you have any rectal bleeding.   It was a pleasure to see you today. I want to create trusting relationships with patients and provide genuine, compassionate, and quality care. I truly value your feedback! please be on the lookout for a survey regarding your visit with me today. I appreciate your input about our visit and your time in completing this!    Amea Mcphail L. Jeanmarie Hubert, MSN, APRN, AGNP-C Adult-Gerontology Nurse Practitioner [facility] at [facility]

## 2022-12-19 NOTE — Progress Notes (Signed)
Referring Provider: Thana Ates, MD Primary Care Physician:  Thana Ates, MD Primary GI Physician: Dr. Levon Hedger   Chief Complaint  Patient presents with   Anemia    Patient arrives with daughter [name]. Follow up on anemia. Takes ferrous sulfate one daily.    HPI:   [name] is a 89 y.o. female with past medical history of anxiety, arthritis, CKD, depression, GERD, HLD, HTN, Osteoporosis, Stroke   Patient presenting today for follow up of IDA  Last seen May 2024, denied rectal bleeding, melena, fatigue, sob, dizziness. Started PO Iron on 5/1. No nsaids. Taking omeprazole 20mg .  Iron panel iron 24, TIBC 232, iron sat 10%, ferritin 95 in April  Hgb 10   Recommended to do fecal occult cards x3, consider capsule study/EGD/Colonoscopy if stool cards positive. Continue PO iron. Miralax for constipation   Stool cards negative x3, no recent Iron studies done  Present:  Patient arrives with daughter. States she had labs done with [name] recently and states they were told they looked good (I am not able to review these at this time). She is still doing PO iron daily. They are making sure she eats if she takes her aggrenox so that she is not taking it on an empty stomach. She does have history of PUD in the past secondary to NSAID use. She denies any fatigue. Dizziness or sob. Daughter states she ambulates with a cane and does well. She even had some physical therapy a while back which helped her. No rectal bleeding or melena. She has occasional constipation but this is usually well managed with eating applesauce, plenty of fruits and veggies and drinking water. She has not had to take anything for her constipation. Weight Is stable since may 2024, actually up 7 pounds from last June. No nausea or vomiting. Appetite is good. Overall, patient feeling well, denies any GI complaints    Last Colonoscopy:2011? diverticulosis Last Endoscopy: June 2009patchy erythema.  Biopsies of the  small intestine showed no  evidence of celiac sprue.  Her gastric biopsy showed reactive  gastropathy.     Recommendations:    Past Medical History:  Diagnosis Date   Anxiety    Arthritis    CKD (chronic kidney disease)    [name]   Depression    GERD (gastroesophageal reflux disease)    Hyperlipidemia    Hypertension    Osteoporosis    Stroke [name])    UTI (lower urinary tract infection)     Past Surgical History:  Procedure Laterality Date   CHOLECYSTECTOMY      Current Outpatient Medications  Medication Sig Dispense Refill   acetaminophen (TYLENOL) 650 MG CR tablet Take 650 mg by mouth every 8 (eight) hours as needed for pain.     amLODipine (NORVASC) 2.5 MG tablet Take 2.5 mg by mouth daily.     atorvastatin (LIPITOR) 20 MG tablet Take 1 tablet (20 mg total) by mouth daily. 90 tablet 1   calcitRIOL (ROCALTROL) 0.25 MCG capsule Take 0.25 mcg by mouth daily.     cetirizine (ZYRTEC) 10 MG tablet Take 10 mg by mouth daily. As needed     Cranberry-Vitamin C-Vitamin E (CRANBERRY PLUS VITAMIN C) 4200-20-3 MG-MG-UNIT CAPS Take by mouth.     dipyridamole-aspirin (AGGRENOX) 200-25 MG 12hr capsule Take 1 capsule by mouth 2 (two) times daily. 180 capsule 3   ferrous sulfate 325 (65 FE) MG EC tablet Take 325 mg by mouth daily with breakfast.  labetalol (NORMODYNE) 100 MG tablet Take 1 tablet (100 mg total) by mouth 2 (two) times daily. 180 tablet 0   Omega-3 Fatty Acids (OMEGA-3 FISH OIL PO) Take by mouth.     omeprazole (PRILOSEC) 20 MG capsule TAKE 1 CAPSULE(20 MG) BY MOUTH DAILY 90 capsule 2   sodium bicarbonate 650 MG tablet Take 650 mg by mouth 2 (two) times daily.     traMADol (ULTRAM) 50 MG tablet Take 1 tablet (50 mg total) by mouth daily as needed. 90 tablet 0   No current facility-administered medications for this visit.    Allergies as of 12/19/2022 - Review Complete 12/19/2022  Allergen Reaction Noted   Ciprofloxacin     Diphenhydramine hcl     Sulfonamide  derivatives Rash     Family History  Problem Relation Age of Onset   Cancer Mother    Cancer Father     Social History   Socioeconomic History   Marital status: Widowed    Spouse name: Not on file   Number of children: Not on file   Years of education: Not on file   Highest education level: Not on file  Occupational History   Not on file  Tobacco Use   Smoking status: Never   Smokeless tobacco: Never  Vaping Use   Vaping status: Never Used  Substance and Sexual Activity   Alcohol use: No   Drug use: No   Sexual activity: Never  Other Topics Concern   Not on file  Social History Narrative   Not on file   Social Determinants of Health   Financial Resource Strain: Not on file  Food Insecurity: Not on file  Transportation Needs: Not on file  Physical Activity: Not on file  Stress: Not on file  Social Connections: Unknown (08/09/2021)   Received from [facility]   Social Network    Social Network: Not on file    Review of systems General: negative for malaise, night sweats, fever, chills, weight loss Neck: Negative for lumps, goiter, pain and significant neck swelling Resp: Negative for cough, wheezing, dyspnea at rest CV: Negative for chest pain, leg swelling, palpitations, orthopnea GI: denies melena, hematochezia, nausea, vomiting, diarrhea, dysphagia, odyonophagia, early satiety or unintentional weight loss. +occasional constipation  MSK: Negative for joint pain or swelling, back pain, and muscle pain. Derm: Negative for itching or rash Psych: Denies depression, anxiety, memory loss, confusion. No homicidal or suicidal ideation.  Heme: Negative for prolonged bleeding, bruising easily, and swollen nodes. Endocrine: Negative for cold or heat intolerance, polyuria, polydipsia and goiter. Neuro: negative for tremor, gait imbalance, syncope and seizures. The remainder of the review of systems is noncontributory.  Physical Exam: BP (!) 144/80 (BP  Location: Right Arm, Patient Position: Sitting, Cuff Size: Large)   Temp 97.9 F (36.6 C) (Oral)   Ht 4\' 9"  (1.448 m)   Wt 138 lb 3.2 oz (62.7 kg)   BMI 29.91 kg/m  General:   Alert and oriented. No distress noted. Pleasant and cooperative.  Head:  Normocephalic and atraumatic. Eyes:  Conjuctiva clear without scleral icterus. Mouth:  Oral mucosa pink and moist. Good dentition. No lesions. Heart: Normal rate and rhythm, s1 and s2 heart sounds present.  Lungs: Clear lung sounds in all lobes. Respirations equal and unlabored. Abdomen:  +BS, soft, non-tender and non-distended. No rebound or guarding. No HSM or masses noted. Derm: No palmar erythema or jaundice Msk:  Symmetrical without gross deformities. Normal posture. Extremities:  Without edema.  Neurologic:  Alert and  oriented x4 Psych:  Alert and cooperative. Normal mood and affect.  Invalid input(s): "6 MONTHS"   ASSESSMENT: WHITENY SCHMITTER is a 89 y.o. female presenting today for follow up of IDA  Ongoing IDA off and on since atleast 2009, per chart review. At last visit, occult stool cards x3 all negative. Patient continues on PO iron, denies rectal bleeding, melena, sob, dizziness, fatigue, weight loss, abdominal pain. Family notes patient had recent iron studies with her renal doctor that were good, I will obtain these for further review. I did again discuss endoscopic evaluation (EGD/Colonoscopy) with the patient and family, to include indications, risks and benefits, however, it is possible that risks of this procedure would outweight the benefits considering patient's age and multi morbidities. Given this consideration of avoiding endoscopic evaluations, I also cannot ultimately rule out potential for presence of something such as malignancy as cause of the patient's symptoms, I discussed this at length with the patient and family who verbalized understanding. Ultimately patient stated she does not wish to undergo any endoscopic  evaluations at this time, which I feel is reasonable given her age and multimorbidities. For now will continue with PO iron, she has frequent iron studies done with her nephrologist. I advised patient to make me aware of any rectal bleeding, melena or if iron levels decline while on PO iron, [name] can have her see [place] again, otherwise, we will plan to see her on as needed basis at this time.    PLAN:  Continue PO iron  2. Obtain iron studies from [name]  3. Pt to make me aware of any new GI symptoms(rectal bleeding, melena)  All questions were answered, patient verbalized understanding and is in agreement with plan as outlined above.   Follow Up: PRN   Solstice Lastinger L. Jeanmarie Hubert, MSN, APRN, AGNP-C Adult-Gerontology Nurse Practitioner [name]  I have reviewed the note and agree with the APP's assessment as described in this progress note  Katrinka Blazing, MD Gastroenterology and Hepatology [name]

## 2023-02-07 DIAGNOSIS — N189 Chronic kidney disease, unspecified: Secondary | ICD-10-CM | POA: Diagnosis not present

## 2023-02-07 DIAGNOSIS — R600 Localized edema: Secondary | ICD-10-CM | POA: Diagnosis not present

## 2023-02-07 DIAGNOSIS — R809 Proteinuria, unspecified: Secondary | ICD-10-CM | POA: Diagnosis not present

## 2023-02-07 DIAGNOSIS — I129 Hypertensive chronic kidney disease with stage 1 through stage 4 chronic kidney disease, or unspecified chronic kidney disease: Secondary | ICD-10-CM | POA: Diagnosis not present

## 2023-02-07 DIAGNOSIS — N2581 Secondary hyperparathyroidism of renal origin: Secondary | ICD-10-CM | POA: Diagnosis not present

## 2023-02-07 DIAGNOSIS — N184 Chronic kidney disease, stage 4 (severe): Secondary | ICD-10-CM | POA: Diagnosis not present

## 2023-02-08 ENCOUNTER — Ambulatory Visit: Payer: Medicare HMO | Admitting: Orthopedic Surgery

## 2023-02-09 ENCOUNTER — Ambulatory Visit: Payer: Medicare HMO | Admitting: Orthopedic Surgery

## 2023-02-09 DIAGNOSIS — M170 Bilateral primary osteoarthritis of knee: Secondary | ICD-10-CM

## 2023-02-09 DIAGNOSIS — B351 Tinea unguium: Secondary | ICD-10-CM

## 2023-02-09 DIAGNOSIS — I129 Hypertensive chronic kidney disease with stage 1 through stage 4 chronic kidney disease, or unspecified chronic kidney disease: Secondary | ICD-10-CM | POA: Diagnosis not present

## 2023-02-09 DIAGNOSIS — N184 Chronic kidney disease, stage 4 (severe): Secondary | ICD-10-CM | POA: Diagnosis not present

## 2023-02-09 DIAGNOSIS — R809 Proteinuria, unspecified: Secondary | ICD-10-CM | POA: Diagnosis not present

## 2023-02-09 DIAGNOSIS — D631 Anemia in chronic kidney disease: Secondary | ICD-10-CM | POA: Diagnosis not present

## 2023-02-12 ENCOUNTER — Encounter: Payer: Self-pay | Admitting: Orthopedic Surgery

## 2023-02-12 NOTE — Progress Notes (Signed)
Office Visit Note   Patient: [name]           Date of Birth: 01-10-1931           MRN: [id number] Visit Date: 02/09/2023              Requested by: Thana Ates, MD [address] [place],  [place] [address] PCP: Thana Ates, MD  No chief complaint on file.     HPI: Patient is a 89 year old woman who presents for evaluation for both lower extremities.  Patient has osteoarthritis both knees and onychomycotic nails x 10 that she is unable to safely trim on her own.  Assessment & Plan: Visit Diagnoses:  1. Onychomycosis   2. Bilateral primary osteoarthritis of knee     Plan: Nails were trimmed x 10 without complications.  No foot ulcers.  Follow-Up Instructions: Return in about 3 months (around 05/12/2023).   Ortho Exam  Patient is alert, oriented, no adenopathy, well-dressed, normal affect, normal respiratory effort. Examination patient has no effusion of either knee no cellulitis.  She has no plantar ulcers.  She has thickened discolored onychomycotic nails x 10.  After informed consent the nails were trimmed x 10 without complication.  Imaging: No results found. No images are attached to the encounter.  Labs: Lab Results  Component Value Date   HGBA1C 6.1 (H) 08/26/2020   HGBA1C 5.7 (H) 08/12/2019   HGBA1C 5.7 (H) 02/02/2016   ESRSEDRATE 78 (H) 10/04/2007   REPTSTATUS 09/25/2021 FINAL 09/23/2021   CULT >=100,000 COLONIES/mL ESCHERICHIA COLI (A) 09/23/2021   LABORGA ESCHERICHIA COLI (A) 09/23/2021     Lab Results  Component Value Date   ALBUMIN 3.2 (L) 09/23/2021   ALBUMIN 3.9 07/29/2021   ALBUMIN 3.8 10/25/2016    Lab Results  Component Value Date   MG 1.8 09/24/2021   MG 1.6 (L) 09/23/2021   MG 1.9 10/03/2007   Lab Results  Component Value Date   VD25OH 62 08/12/2019   VD25OH 54.3 08/09/2015   VD25OH 59 06/16/2013    No results found for: "PREALBUMIN"    Latest Ref Rng & Units 09/23/2021    4:04 AM 09/22/2021    5:53  PM 07/29/2021    9:24 PM  CBC EXTENDED  WBC 4.0 - 10.5 K/uL 5.8  6.6    RBC 3.87 - 5.11 MIL/uL 3.19  3.45    Hemoglobin 12.0 - 15.0 g/dL 62.1  30.8  65.7   HCT 36.0 - 46.0 % 33.5  36.4  38.0   Platelets 150 - 400 K/uL 157  169    NEUT# 1.7 - 7.7 K/uL  4.0    Lymph# 0.7 - 4.0 K/uL  1.7       There is no height or weight on file to calculate BMI.  Orders:  No orders of the defined types were placed in this encounter.  No orders of the defined types were placed in this encounter.    Procedures: No procedures performed  Clinical Data: No additional findings.  ROS:  All other systems negative, except as noted in the HPI. Review of Systems  Objective: Vital Signs: There were no vitals taken for this visit.  Specialty Comments:  No specialty comments available.  PMFS History: Patient Active Problem List   Diagnosis Date Noted   Iron deficiency anemia 08/22/2022   UTI (urinary tract infection) 09/24/2021   Mixed hyperlipidemia 09/23/2021   Acute renal failure superimposed on stage 4 chronic  kidney disease (HCC) 09/22/2021   Prediabetes 08/29/2020   Fatigue 08/26/2020   Protein-calorie malnutrition (HCC) 04/19/2020   GERD (gastroesophageal reflux disease) 08/12/2019   Bilateral primary osteoarthritis of knee 11/09/2016   History of stroke 06/02/2016   Osteoarthritis 03/25/2014   Anxiety and depression 02/20/2012   Chronic headache 02/20/2012   Peripheral edema 02/19/2012   Hyperparathyroidism (HCC) 10/23/2008   Essential hypertension 10/23/2008   GALLSTONE PANCREATITIS 10/23/2008   Chronic kidney disease 10/23/2008   DEGENERATIVE JOINT DISEASE 10/23/2008   PROTEINURIA 10/23/2008   Past Medical History:  Diagnosis Date   Anxiety    Arthritis    CKD (chronic kidney disease)    Dr. Fausto Skillern   Depression    GERD (gastroesophageal reflux disease)    Hyperlipidemia    Hypertension    Osteoporosis    Stroke [facility])    UTI (lower urinary tract infection)      Family History  Problem Relation Age of Onset   Cancer Mother    Cancer Father     Past Surgical History:  Procedure Laterality Date   CHOLECYSTECTOMY     Social History   Occupational History   Not on file  Tobacco Use   Smoking status: Never   Smokeless tobacco: Never  Vaping Use   Vaping status: Never Used  Substance and Sexual Activity   Alcohol use: No   Drug use: No   Sexual activity: Never

## 2023-04-25 DIAGNOSIS — R600 Localized edema: Secondary | ICD-10-CM | POA: Diagnosis not present

## 2023-04-25 DIAGNOSIS — I10 Essential (primary) hypertension: Secondary | ICD-10-CM | POA: Diagnosis not present

## 2023-04-25 DIAGNOSIS — D7589 Other specified diseases of blood and blood-forming organs: Secondary | ICD-10-CM | POA: Diagnosis not present

## 2023-04-25 DIAGNOSIS — Z23 Encounter for immunization: Secondary | ICD-10-CM | POA: Diagnosis not present

## 2023-04-25 DIAGNOSIS — E611 Iron deficiency: Secondary | ICD-10-CM | POA: Diagnosis not present

## 2023-04-25 DIAGNOSIS — N189 Chronic kidney disease, unspecified: Secondary | ICD-10-CM | POA: Diagnosis not present

## 2023-04-25 DIAGNOSIS — R809 Proteinuria, unspecified: Secondary | ICD-10-CM | POA: Diagnosis not present

## 2023-04-25 DIAGNOSIS — M170 Bilateral primary osteoarthritis of knee: Secondary | ICD-10-CM | POA: Diagnosis not present

## 2023-04-25 DIAGNOSIS — D631 Anemia in chronic kidney disease: Secondary | ICD-10-CM | POA: Diagnosis not present

## 2023-04-25 DIAGNOSIS — N184 Chronic kidney disease, stage 4 (severe): Secondary | ICD-10-CM | POA: Diagnosis not present

## 2023-05-02 DIAGNOSIS — K219 Gastro-esophageal reflux disease without esophagitis: Secondary | ICD-10-CM | POA: Diagnosis not present

## 2023-05-02 DIAGNOSIS — G319 Degenerative disease of nervous system, unspecified: Secondary | ICD-10-CM | POA: Diagnosis not present

## 2023-05-02 DIAGNOSIS — Z833 Family history of diabetes mellitus: Secondary | ICD-10-CM | POA: Diagnosis not present

## 2023-05-02 DIAGNOSIS — R269 Unspecified abnormalities of gait and mobility: Secondary | ICD-10-CM | POA: Diagnosis not present

## 2023-05-02 DIAGNOSIS — M4800 Spinal stenosis, site unspecified: Secondary | ICD-10-CM | POA: Diagnosis not present

## 2023-05-02 DIAGNOSIS — Z9181 History of falling: Secondary | ICD-10-CM | POA: Diagnosis not present

## 2023-05-02 DIAGNOSIS — N2581 Secondary hyperparathyroidism of renal origin: Secondary | ICD-10-CM | POA: Diagnosis not present

## 2023-05-02 DIAGNOSIS — N184 Chronic kidney disease, stage 4 (severe): Secondary | ICD-10-CM | POA: Diagnosis not present

## 2023-05-02 DIAGNOSIS — I129 Hypertensive chronic kidney disease with stage 1 through stage 4 chronic kidney disease, or unspecified chronic kidney disease: Secondary | ICD-10-CM | POA: Diagnosis not present

## 2023-05-02 DIAGNOSIS — E785 Hyperlipidemia, unspecified: Secondary | ICD-10-CM | POA: Diagnosis not present

## 2023-05-02 DIAGNOSIS — M1990 Unspecified osteoarthritis, unspecified site: Secondary | ICD-10-CM | POA: Diagnosis not present

## 2023-05-02 DIAGNOSIS — I719 Aortic aneurysm of unspecified site, without rupture: Secondary | ICD-10-CM | POA: Diagnosis not present

## 2023-05-03 DIAGNOSIS — N184 Chronic kidney disease, stage 4 (severe): Secondary | ICD-10-CM | POA: Diagnosis not present

## 2023-05-03 DIAGNOSIS — D631 Anemia in chronic kidney disease: Secondary | ICD-10-CM | POA: Diagnosis not present

## 2023-05-03 DIAGNOSIS — R809 Proteinuria, unspecified: Secondary | ICD-10-CM | POA: Diagnosis not present

## 2023-05-03 DIAGNOSIS — I129 Hypertensive chronic kidney disease with stage 1 through stage 4 chronic kidney disease, or unspecified chronic kidney disease: Secondary | ICD-10-CM | POA: Diagnosis not present

## 2023-05-14 ENCOUNTER — Encounter: Payer: Self-pay | Admitting: Orthopedic Surgery

## 2023-05-14 ENCOUNTER — Ambulatory Visit (INDEPENDENT_AMBULATORY_CARE_PROVIDER_SITE_OTHER): Payer: Medicare HMO | Admitting: Orthopedic Surgery

## 2023-05-14 DIAGNOSIS — M170 Bilateral primary osteoarthritis of knee: Secondary | ICD-10-CM | POA: Diagnosis not present

## 2023-05-14 DIAGNOSIS — B351 Tinea unguium: Secondary | ICD-10-CM

## 2023-05-14 NOTE — Progress Notes (Signed)
Office Visit Note   Patient: [name]           Date of Birth: [birth year]           MRN: [id number] Visit Date: 05/14/2023              Requested by: Thana Ates, MD [address] [place],  [place] [address] PCP: Thana Ates, MD  Chief Complaint  Patient presents with   Right Foot - Follow-up    Bilateral toenail trimming   Left Foot - Follow-up      HPI: Patient is a 89 year old woman who is seen in follow-up for osteoarthritis both knees and onychomycotic nails times time.  Assessment & Plan: Visit Diagnoses:  1. Onychomycosis   2. Bilateral primary osteoarthritis of knee     Plan: Nails were trimmed x 10.  Reevaluate in 3 months.  Follow-Up Instructions: Return in about 3 months (around 08/11/2023).   Ortho Exam  Patient is alert, oriented, no adenopathy, well-dressed, normal affect, normal respiratory effort. Examination of both lower extremities patient has no open ulcers no plantar ulcers.  She has thickened discolored onychomycotic nails x 10 without paronychial infection.  Nails were trimmed x 10 without complications.  Imaging: No results found. No images are attached to the encounter.  Labs: Lab Results  Component Value Date   HGBA1C 6.1 (H) 08/26/2020   HGBA1C 5.7 (H) 08/12/2019   HGBA1C 5.7 (H) 02/02/2016   ESRSEDRATE 78 (H) 10/04/2007   REPTSTATUS 09/25/2021 FINAL 09/23/2021   CULT >=100,000 COLONIES/mL ESCHERICHIA COLI (A) 09/23/2021   LABORGA ESCHERICHIA COLI (A) 09/23/2021     Lab Results  Component Value Date   ALBUMIN 3.2 (L) 09/23/2021   ALBUMIN 3.9 07/29/2021   ALBUMIN 3.8 10/25/2016    Lab Results  Component Value Date   MG 1.8 09/24/2021   MG 1.6 (L) 09/23/2021   MG 1.9 10/03/2007   Lab Results  Component Value Date   VD25OH 62 08/12/2019   VD25OH 54.3 08/09/2015   VD25OH 59 06/16/2013    No results found for: "PREALBUMIN"    Latest Ref Rng & Units 09/23/2021    4:04 AM 09/22/2021    5:53 PM  07/29/2021    9:24 PM  CBC EXTENDED  WBC 4.0 - 10.5 K/uL 5.8  6.6    RBC 3.87 - 5.11 MIL/uL 3.19  3.45    Hemoglobin 12.0 - 15.0 g/dL 52.8  41.3  24.4   HCT 36.0 - 46.0 % 33.5  36.4  38.0   Platelets 150 - 400 K/uL 157  169    NEUT# 1.7 - 7.7 K/uL  4.0    Lymph# 0.7 - 4.0 K/uL  1.7       There is no height or weight on file to calculate BMI.  Orders:  No orders of the defined types were placed in this encounter.  No orders of the defined types were placed in this encounter.    Procedures: No procedures performed  Clinical Data: No additional findings.  ROS:  All other systems negative, except as noted in the HPI. Review of Systems  Objective: Vital Signs: There were no vitals taken for this visit.  Specialty Comments:  No specialty comments available.  PMFS History: Patient Active Problem List   Diagnosis Date Noted   Iron deficiency anemia 08/22/2022   UTI (urinary tract infection) 09/24/2021   Mixed hyperlipidemia 09/23/2021   Acute renal failure superimposed on stage 4 chronic  kidney disease (HCC) 09/22/2021   Prediabetes 08/29/2020   Fatigue 08/26/2020   Protein-calorie malnutrition (HCC) 04/19/2020   GERD (gastroesophageal reflux disease) 08/12/2019   Bilateral primary osteoarthritis of knee 11/09/2016   History of stroke 06/02/2016   Osteoarthritis 03/25/2014   Anxiety and depression 02/20/2012   Chronic headache 02/20/2012   Peripheral edema 02/19/2012   Hyperparathyroidism (HCC) 10/23/2008   Essential hypertension 10/23/2008   GALLSTONE PANCREATITIS 10/23/2008   Chronic kidney disease 10/23/2008   DEGENERATIVE JOINT DISEASE 10/23/2008   PROTEINURIA 10/23/2008   Past Medical History:  Diagnosis Date   Anxiety    Arthritis    CKD (chronic kidney disease)    Dr. Fausto Skillern   Depression    GERD (gastroesophageal reflux disease)    Hyperlipidemia    Hypertension    Osteoporosis    Stroke [facility])    UTI (lower urinary tract infection)     Family  History  Problem Relation Age of Onset   Cancer Mother    Cancer Father     Past Surgical History:  Procedure Laterality Date   CHOLECYSTECTOMY     Social History   Occupational History   Not on file  Tobacco Use   Smoking status: Never   Smokeless tobacco: Never  Vaping Use   Vaping status: Never Used  Substance and Sexual Activity   Alcohol use: No   Drug use: No   Sexual activity: Never

## 2023-06-05 DIAGNOSIS — I10 Essential (primary) hypertension: Secondary | ICD-10-CM | POA: Diagnosis not present

## 2023-06-05 DIAGNOSIS — R197 Diarrhea, unspecified: Secondary | ICD-10-CM | POA: Diagnosis not present

## 2023-06-05 DIAGNOSIS — R630 Anorexia: Secondary | ICD-10-CM | POA: Diagnosis not present

## 2023-06-09 ENCOUNTER — Other Ambulatory Visit: Payer: Self-pay

## 2023-06-09 ENCOUNTER — Emergency Department (HOSPITAL_COMMUNITY)

## 2023-06-09 ENCOUNTER — Inpatient Hospital Stay (HOSPITAL_COMMUNITY)
Admission: EM | Admit: 2023-06-09 | Discharge: 2023-06-13 | DRG: 280 | Disposition: A | Discharge status: Home Health | Attending: Internal Medicine | Admitting: Internal Medicine

## 2023-06-09 DIAGNOSIS — I214 Non-ST elevation (NSTEMI) myocardial infarction: Secondary | ICD-10-CM | POA: Diagnosis not present

## 2023-06-09 DIAGNOSIS — I160 Hypertensive urgency: Secondary | ICD-10-CM | POA: Diagnosis not present

## 2023-06-09 DIAGNOSIS — I7121 Aneurysm of the ascending aorta, without rupture: Secondary | ICD-10-CM | POA: Diagnosis not present

## 2023-06-09 DIAGNOSIS — Z888 Allergy status to other drugs, medicaments and biological substances status: Secondary | ICD-10-CM | POA: Diagnosis not present

## 2023-06-09 DIAGNOSIS — R0902 Hypoxemia: Secondary | ICD-10-CM | POA: Diagnosis not present

## 2023-06-09 DIAGNOSIS — Z1152 Encounter for screening for COVID-19: Secondary | ICD-10-CM

## 2023-06-09 DIAGNOSIS — Z881 Allergy status to other antibiotic agents status: Secondary | ICD-10-CM

## 2023-06-09 DIAGNOSIS — I130 Hypertensive heart and chronic kidney disease with heart failure and stage 1 through stage 4 chronic kidney disease, or unspecified chronic kidney disease: Secondary | ICD-10-CM | POA: Diagnosis not present

## 2023-06-09 DIAGNOSIS — Z882 Allergy status to sulfonamides status: Secondary | ICD-10-CM | POA: Diagnosis not present

## 2023-06-09 DIAGNOSIS — R5383 Other fatigue: Secondary | ICD-10-CM | POA: Diagnosis not present

## 2023-06-09 DIAGNOSIS — R54 Age-related physical debility: Secondary | ICD-10-CM | POA: Diagnosis present

## 2023-06-09 DIAGNOSIS — Z91158 Patient's noncompliance with renal dialysis for other reason: Secondary | ICD-10-CM

## 2023-06-09 DIAGNOSIS — D631 Anemia in chronic kidney disease: Secondary | ICD-10-CM | POA: Diagnosis present

## 2023-06-09 DIAGNOSIS — Z743 Need for continuous supervision: Secondary | ICD-10-CM | POA: Diagnosis not present

## 2023-06-09 DIAGNOSIS — N179 Acute kidney failure, unspecified: Secondary | ICD-10-CM | POA: Diagnosis not present

## 2023-06-09 DIAGNOSIS — I5033 Acute on chronic diastolic (congestive) heart failure: Secondary | ICD-10-CM | POA: Diagnosis not present

## 2023-06-09 DIAGNOSIS — E782 Mixed hyperlipidemia: Secondary | ICD-10-CM | POA: Diagnosis not present

## 2023-06-09 DIAGNOSIS — R079 Chest pain, unspecified: Secondary | ICD-10-CM | POA: Diagnosis not present

## 2023-06-09 DIAGNOSIS — I252 Old myocardial infarction: Secondary | ICD-10-CM | POA: Diagnosis not present

## 2023-06-09 DIAGNOSIS — Z66 Do not resuscitate: Secondary | ICD-10-CM | POA: Diagnosis not present

## 2023-06-09 DIAGNOSIS — I083 Combined rheumatic disorders of mitral, aortic and tricuspid valves: Secondary | ICD-10-CM | POA: Diagnosis not present

## 2023-06-09 DIAGNOSIS — Z79899 Other long term (current) drug therapy: Secondary | ICD-10-CM

## 2023-06-09 DIAGNOSIS — Z7982 Long term (current) use of aspirin: Secondary | ICD-10-CM | POA: Diagnosis not present

## 2023-06-09 DIAGNOSIS — Z9049 Acquired absence of other specified parts of digestive tract: Secondary | ICD-10-CM

## 2023-06-09 DIAGNOSIS — Z515 Encounter for palliative care: Secondary | ICD-10-CM | POA: Diagnosis not present

## 2023-06-09 DIAGNOSIS — F419 Anxiety disorder, unspecified: Secondary | ICD-10-CM | POA: Diagnosis present

## 2023-06-09 DIAGNOSIS — M810 Age-related osteoporosis without current pathological fracture: Secondary | ICD-10-CM | POA: Diagnosis not present

## 2023-06-09 DIAGNOSIS — I10 Essential (primary) hypertension: Secondary | ICD-10-CM | POA: Diagnosis present

## 2023-06-09 DIAGNOSIS — N184 Chronic kidney disease, stage 4 (severe): Secondary | ICD-10-CM | POA: Diagnosis not present

## 2023-06-09 DIAGNOSIS — I161 Hypertensive emergency: Secondary | ICD-10-CM | POA: Diagnosis not present

## 2023-06-09 DIAGNOSIS — I2510 Atherosclerotic heart disease of native coronary artery without angina pectoris: Secondary | ICD-10-CM | POA: Diagnosis not present

## 2023-06-09 DIAGNOSIS — I714 Abdominal aortic aneurysm, without rupture, unspecified: Secondary | ICD-10-CM | POA: Diagnosis present

## 2023-06-09 DIAGNOSIS — F329 Major depressive disorder, single episode, unspecified: Secondary | ICD-10-CM | POA: Diagnosis present

## 2023-06-09 DIAGNOSIS — K219 Gastro-esophageal reflux disease without esophagitis: Secondary | ICD-10-CM | POA: Diagnosis present

## 2023-06-09 DIAGNOSIS — R531 Weakness: Secondary | ICD-10-CM | POA: Diagnosis not present

## 2023-06-09 DIAGNOSIS — Z8673 Personal history of transient ischemic attack (TIA), and cerebral infarction without residual deficits: Secondary | ICD-10-CM | POA: Diagnosis not present

## 2023-06-09 DIAGNOSIS — I071 Rheumatic tricuspid insufficiency: Secondary | ICD-10-CM | POA: Diagnosis not present

## 2023-06-09 DIAGNOSIS — N189 Chronic kidney disease, unspecified: Secondary | ICD-10-CM | POA: Diagnosis present

## 2023-06-09 DIAGNOSIS — Z7401 Bed confinement status: Secondary | ICD-10-CM | POA: Diagnosis not present

## 2023-06-09 LAB — RESP PANEL BY RT-PCR (RSV, FLU A&B, COVID)  RVPGX2
Influenza A by PCR: NEGATIVE
Influenza B by PCR: NEGATIVE
Resp Syncytial Virus by PCR: NEGATIVE
SARS Coronavirus 2 by RT PCR: NEGATIVE

## 2023-06-09 LAB — CBC
HCT: 31.1 % — ABNORMAL LOW (ref 36.0–46.0)
Hemoglobin: 9.4 g/dL — ABNORMAL LOW (ref 12.0–15.0)
MCH: 32 pg (ref 26.0–34.0)
MCHC: 30.2 g/dL (ref 30.0–36.0)
MCV: 105.8 fL — ABNORMAL HIGH (ref 80.0–100.0)
Platelets: 227 10*3/uL (ref 150–400)
RBC: 2.94 MIL/uL — ABNORMAL LOW (ref 3.87–5.11)
RDW: 14.6 % (ref 11.5–15.5)
WBC: 9.1 10*3/uL (ref 4.0–10.5)
nRBC: 0 % (ref 0.0–0.2)

## 2023-06-09 LAB — BASIC METABOLIC PANEL
Anion gap: 11 (ref 5–15)
BUN: 33 mg/dL — ABNORMAL HIGH (ref 8–23)
CO2: 19 mmol/L — ABNORMAL LOW (ref 22–32)
Calcium: 8.4 mg/dL — ABNORMAL LOW (ref 8.9–10.3)
Chloride: 114 mmol/L — ABNORMAL HIGH (ref 98–111)
Creatinine, Ser: 2.28 mg/dL — ABNORMAL HIGH (ref 0.44–1.00)
GFR, Estimated: 20 mL/min — ABNORMAL LOW (ref >60)
Glucose, Bld: 175 mg/dL — ABNORMAL HIGH (ref 70–99)
Potassium: 4.6 mmol/L (ref 3.5–5.1)
Sodium: 144 mmol/L (ref 135–145)

## 2023-06-09 LAB — TROPONIN I (HIGH SENSITIVITY)
Troponin I (High Sensitivity): 1233 ng/L (ref <18)
Troponin I (High Sensitivity): 1985 ng/L (ref <18)

## 2023-06-09 MED ORDER — NITROGLYCERIN 0.4 MG SL SUBL: 0.4000 mg | SUBLINGUAL_TABLET | SUBLINGUAL | Status: DC | PRN

## 2023-06-09 MED ORDER — METOPROLOL TARTRATE 25 MG PO TABS
25.0000 mg | ORAL_TABLET | Freq: Four times a day (QID) | ORAL | Status: DC
  Administered 2023-06-09 – 2023-06-10 (×3): 25 mg via ORAL
  Filled 2023-06-09 (×3): qty 1

## 2023-06-09 MED ORDER — ATORVASTATIN CALCIUM 80 MG PO TABS
80.0000 mg | ORAL_TABLET | Freq: Every day | ORAL | Status: DC
  Administered 2023-06-09 – 2023-06-12 (×4): 80 mg via ORAL
  Filled 2023-06-09: qty 2
  Filled 2023-06-09 (×3): qty 1

## 2023-06-09 MED ORDER — SODIUM BICARBONATE 650 MG PO TABS
650.0000 mg | ORAL_TABLET | Freq: Two times a day (BID) | ORAL | Status: DC
  Administered 2023-06-09 – 2023-06-13 (×8): 650 mg via ORAL
  Filled 2023-06-09 (×8): qty 1

## 2023-06-09 MED ORDER — ONDANSETRON HCL 4 MG/2ML IJ SOLN: 4.0000 mg | Freq: Four times a day (QID) | INTRAMUSCULAR | Status: DC | PRN

## 2023-06-09 MED ORDER — NITROGLYCERIN 0.4 MG SL SUBL
0.4000 mg | SUBLINGUAL_TABLET | SUBLINGUAL | Status: AC | PRN
  Administered 2023-06-09 (×3): 0.4 mg via SUBLINGUAL
  Filled 2023-06-09: qty 1

## 2023-06-09 MED ORDER — PANTOPRAZOLE SODIUM 40 MG PO TBEC
40.0000 mg | DELAYED_RELEASE_TABLET | Freq: Every day | ORAL | Status: DC
  Administered 2023-06-10 – 2023-06-13 (×4): 40 mg via ORAL
  Filled 2023-06-09 (×4): qty 1

## 2023-06-09 MED ORDER — METOPROLOL TARTRATE 5 MG/5ML IV SOLN
5.0000 mg | Freq: Once | INTRAVENOUS | Status: AC
  Administered 2023-06-09: 2.5 mg via INTRAVENOUS
  Filled 2023-06-09: qty 5

## 2023-06-09 MED ORDER — ACETAMINOPHEN 325 MG PO TABS: 650.0000 mg | ORAL_TABLET | ORAL | Status: DC | PRN

## 2023-06-09 MED ORDER — HEPARIN BOLUS VIA INFUSION
3000.0000 [IU] | Freq: Once | INTRAVENOUS | Status: AC
  Administered 2023-06-09: 3000 [IU] via INTRAVENOUS

## 2023-06-09 MED ORDER — ASPIRIN 81 MG PO TBEC
81.0000 mg | DELAYED_RELEASE_TABLET | Freq: Every day | ORAL | Status: DC
  Administered 2023-06-10: 81 mg via ORAL
  Filled 2023-06-09: qty 1

## 2023-06-09 MED ORDER — ALBUTEROL SULFATE (2.5 MG/3ML) 0.083% IN NEBU
2.5000 mg | INHALATION_SOLUTION | RESPIRATORY_TRACT | Status: DC | PRN
  Administered 2023-06-10 (×2): 2.5 mg via RESPIRATORY_TRACT
  Filled 2023-06-09 (×2): qty 3

## 2023-06-09 MED ORDER — NITROGLYCERIN 2 % TD OINT
1.0000 [in_us] | TOPICAL_OINTMENT | Freq: Once | TRANSDERMAL | Status: AC
  Administered 2023-06-09: 1 [in_us] via TOPICAL
  Filled 2023-06-09: qty 1

## 2023-06-09 MED ORDER — ACETAMINOPHEN 650 MG RE SUPP: 650.0000 mg | Freq: Four times a day (QID) | RECTAL | Status: DC | PRN

## 2023-06-09 MED ORDER — HEPARIN (PORCINE) 25000 UT/250ML-% IV SOLN
1050.0000 [IU]/h | INTRAVENOUS | Status: AC
  Administered 2023-06-09: 650 [IU]/h via INTRAVENOUS
  Administered 2023-06-10: 850 [IU]/h via INTRAVENOUS
  Filled 2023-06-09 (×2): qty 250

## 2023-06-09 MED ORDER — ACETAMINOPHEN 325 MG PO TABS: 650.0000 mg | ORAL_TABLET | Freq: Four times a day (QID) | ORAL | Status: DC | PRN

## 2023-06-09 MED ORDER — LABETALOL HCL 200 MG PO TABS
100.0000 mg | ORAL_TABLET | Freq: Once | ORAL | Status: AC
  Administered 2023-06-09: 100 mg via ORAL
  Filled 2023-06-09: qty 1

## 2023-06-09 NOTE — H&P (Signed)
 [facility] H&P   Patient Demographics:    [name], is a 89 y.o. female  MRN: [id number]   DOB - 16-Apr-1930  Admit Date - 06/09/2023  Outpatient Primary MD for the patient is Thana Ates, MD  Referring MD/NP/PA: PA Burgess Amor  Patient coming from: home  Chief Complaint  Patient presents with   Chest Pain      HPI:    [name]  is a 89 y.o. female,with history of anxiety, CKD stage IV, hypertension, hyperlipidemia, stroke presenting with elevated serum creatinine.  -Patient presents to ED secondary to complaints of chest pain, patient report she had recent diarrhea, thinks it is related to food poisoning, this has resolved 2 days ago, she was treated with short course of azithromycin by PCP, he does report chest pain, pressure quality, developed last night, resolved without intervention, this morning again at 5:30 AM reports left-sided nonradiating chest pain, pressure-like, also send heavy with deep inspiration, she 1 dose aspirin and sublingual nitro by EMS to arrival, reports her symptoms resolved. -ED workup significant for elevated high-sensitivity troponins 1233, creatinine at baseline elevated at 2.28, bicarb low at 19, and started on heparin drip for NSTEMI, ED discussed with Dr. Wyline Mood, who recommended admission to [facility], and initiating high-dose statin .  [facility] hospitalist consulted to admit  Review of systems:      A full 10 point Review of Systems was done, except as stated above, all other Review of Systems were negative.   With Past History of the following :    Past Medical History:  Diagnosis Date   Anxiety    Arthritis    CKD (chronic kidney disease)    Dr. Fausto Skillern   Depression    GERD (gastroesophageal reflux disease)    Hyperlipidemia    Hypertension    Osteoporosis    Stroke [facility])    UTI (lower urinary tract infection)        Past Surgical History:  Procedure Laterality Date   CHOLECYSTECTOMY        Social History:     Social History   Tobacco Use   Smoking status: Never   Smokeless tobacco: Never  Substance Use Topics   Alcohol use: No        Family History :     Family History  Problem Relation Age of Onset   Cancer Mother    Cancer Father      Home Medications:   Prior to Admission medications   Medication Sig Start Date End Date Taking? Authorizing Provider  acetaminophen (TYLENOL) 650 MG CR tablet Take 650 mg by mouth every 8 (eight) hours as needed for pain.    [provider]  amLODipine (NORVASC) 2.5 MG tablet Take 2.5 mg by mouth daily.    [provider]  atorvastatin (LIPITOR) 20 MG tablet Take 1 tablet (20 mg total) by  mouth daily. 04/08/21   Valentino Nose, NP  calcitRIOL (ROCALTROL) 0.25 MCG capsule Take 0.25 mcg by mouth daily.    [provider]  cetirizine (ZYRTEC) 10 MG tablet Take 10 mg by mouth daily. As needed    [provider]  Cranberry-Vitamin C-Vitamin E (CRANBERRY PLUS VITAMIN C) 4200-20-3 MG-MG-UNIT CAPS Take by mouth.    [provider]  dipyridamole-aspirin (AGGRENOX) 200-25 MG 12hr capsule Take 1 capsule by mouth 2 (two) times daily. 04/08/21   Valentino Nose, NP  ferrous sulfate 325 (65 FE) MG EC tablet Take 325 mg by mouth daily with breakfast.    [provider]  labetalol (NORMODYNE) 100 MG tablet Take 1 tablet (100 mg total) by mouth 2 (two) times daily. 04/29/21   Valentino Nose, NP  Omega-3 Fatty Acids (OMEGA-3 FISH OIL PO) Take by mouth.    [provider]  omeprazole (PRILOSEC) 20 MG capsule TAKE 1 CAPSULE(20 MG) BY MOUTH DAILY 04/29/21   Cathlean Marseilles A, NP  sodium bicarbonate 650 MG tablet Take 650 mg by mouth 2 (two) times daily.    [provider]  traMADol (ULTRAM) 50 MG tablet Take 1 tablet (50 mg total) by mouth daily as needed. 07/30/21   Dione Booze, MD      Allergies:     Allergies  Allergen Reactions   Ciprofloxacin     Acute renal failure   Diphenhydramine Hcl     REACTION: UNKNOWN REACTION   Sulfonamide Derivatives Rash     Physical Exam:   Vitals  Blood pressure (!) 186/112, pulse 95, temperature 98.4 F (36.9 C), temperature source Oral, resp. rate (!) 28, height 4\' 9"  (1.448 m), weight 63.8 kg, SpO2 99%.   1. General Frail elderly female, laying in bed, no apparent distress  2. Normal affect and insight, Not Suicidal or Homicidal, Awake Alert, Oriented X 3.  3. No F.N deficits, ALL C.Nerves Intact, Strength 5/5 all 4 extremities, Sensation intact all 4 extremities, Plantars down going.  4. Ears and Eyes appear Normal, Conjunctivae clear, PERRLA. Moist Oral Mucosa.  5. Supple Neck, No JVD, No cervical lymphadenopathy appriciated, No Carotid Bruits.  6. Symmetrical Chest wall movement, Good air movement bilaterally, tachypneic  7. RRR, No Gallops, Rubs or Murmurs, No Parasternal Heave.  +2 edema  8. Positive Bowel Sounds, Abdomen Soft, No tenderness, No organomegaly appriciated,No rebound -guarding or rigidity.  9.  No Cyanosis, Normal Skin Turgor, No Skin Rash or Bruise.  10. Good muscle tone,  joints appear normal , no effusions, Normal ROM.    Data Review:    CBC Recent Labs  Lab 06/09/23 1454  WBC 9.1  HGB 9.4*  HCT 31.1*  PLT 227  MCV 105.8*  MCH 32.0  MCHC 30.2  RDW 14.6   ------------------------------------------------------------------------------------------------------------------  Chemistries  Recent Labs  Lab 06/09/23 1454  NA 144  K 4.6  CL 114*  CO2 19*  GLUCOSE 175*  BUN 33*  CREATININE 2.28*  CALCIUM 8.4*   ------------------------------------------------------------------------------------------------------------------ estimated creatinine clearance is 12.4 mL/min (A) (by C-G formula based on SCr of 2.28 mg/dL  (H)). ------------------------------------------------------------------------------------------------------------------ No results for input(s): "TSH", "T4TOTAL", "T3FREE", "THYROIDAB" in the last 72 hours.  Invalid input(s): "FREET3"  Coagulation profile No results for input(s): "INR", "PROTIME" in the last 168 hours. ------------------------------------------------------------------------------------------------------------------- No results for input(s): "DDIMER" in the last 72 hours. -------------------------------------------------------------------------------------------------------------------  Cardiac Enzymes No results for input(s): "CKMB", "TROPONINI", "MYOGLOBIN" in the last 168 hours.  Invalid input(s): "CK" ------------------------------------------------------------------------------------------------------------------  No results found for: "BNP"   ---------------------------------------------------------------------------------------------------------------  Urinalysis    Component Value Date/Time   COLORURINE YELLOW 09/22/2021 2100   APPEARANCEUR CLOUDY (A) 09/22/2021 2100   LABSPEC 1.006 09/22/2021 2100   PHURINE 5.0 09/22/2021 2100   GLUCOSEU NEGATIVE 09/22/2021 2100   HGBUR SMALL (A) 09/22/2021 2100   BILIRUBINUR NEGATIVE 09/22/2021 2100   KETONESUR NEGATIVE 09/22/2021 2100   PROTEINUR NEGATIVE 09/22/2021 2100   UROBILINOGEN 0.2 08/13/2015 2036   NITRITE NEGATIVE 09/22/2021 2100   LEUKOCYTESUR MODERATE (A) 09/22/2021 2100    ----------------------------------------------------------------------------------------------------------------   Imaging Results:    DG Chest Port 1 View Result Date: 06/09/2023 CLINICAL DATA:  Chest pain beginning last night, worse this morning. EXAM: PORTABLE CHEST 1 VIEW COMPARISON:  07/29/2021 and older studies. FINDINGS: Cardiac silhouette normal in size.  No mediastinal or hilar masses. Bilateral irregular interstitial  thickening. Patchy areas of hazy airspace opacity in the perihilar and lower lungs. Small pleural effusions. No pneumothorax. Skeletal structures are demineralized, grossly intact. IMPRESSION: Findings consistent with CHF. Electronically Signed   By: Amie Portland [name]   On: 06/09/2023 14:51    EKG:  Vent. rate 93 BPM PR interval 171 ms QRS duration 153 ms QT/QTcB 357/444 ms P-R-T axes 51 -17 34 Sinus rhythm Probable left ventricular hypertrophy Anterior Q waves, possibly due to LVH Abnormal T, consider ischemia, anterior leads     Assessment & Plan:    Principal Problem:   NSTEMI (non-ST elevated myocardial infarction) (HCC) Active Problems:   Essential hypertension   Chronic kidney disease   GERD (gastroesophageal reflux disease)   Mixed hyperlipidemia   NSTEMI -Presents with complaints of chest pain, elevated troponins, she is currently chest pain-free. -Mid to [facility] per cardiology recommendation -Continue with heparin drip -Received full dose aspirin, continue with aspirin daily -Continue with Nitropaste -2D echo is pending -Will start on metoprolol. -Continue with high-dose statin -Will await further recommendations from radiology at [facility]  CKd stage IV -This is at baseline, certain is going to be problematic for further workup  Hypertensive urgency -She is on labetalol and Norvasc at home, for now we will hold these meds, will start on dual with metoprolol, and if blood pressure remains elevated we will do Nitropaste. -Will start on as needed hydralazine  Mixed hyperlipidemia Start high-dose statin   GERD (gastroesophageal reflux disease) Continue PPI   History of stroke -On Aggrenox currently, will continue with aspirin pending further workup and recommendations from cardiology      DVT Prophylaxis Heparin gtt  AM Labs Ordered, also please review Full Orders  Family Communication: Admission, patients condition and plan of care  including tests being ordered have been discussed with the patient and daughter at bedside* who indicate understanding and agree with the plan and Code Status.  Code Status full  Likely DC to  home  Condition GUARDED    Consults called: cardiology Dr Wyline Mood    Admission status: inpatient    Time spent in minutes : 70 minutes   Huey Bienenstock M.D on 06/09/2023 at 4:51 PM   [facility] - Office  [phone]

## 2023-06-09 NOTE — ED Triage Notes (Signed)
 Coming from home. Chest pain started last night and worsened this morning. Non radiating midsternal sharp and heavy chest pain. BP 200/102 with ems. 324mg  aspirin and 1 nitroglycerin given ems. Patient did spit up a little of chewable aspirin. Denies N/V, abdominal pain.  A+Ox4.

## 2023-06-09 NOTE — ED Notes (Signed)
 ED TO INPATIENT HANDOFF REPORT  ED Nurse Name and Phone #: [phone]  S Name/Age/Gender [name] 89 y.o. female Room/Bed: APA10/APA10  Code Status   Code Status: Full Code  Home/SNF/Other Home Patient oriented to: self, place, time, and situation Is this baseline? Yes   Triage Complete: Triage complete  Chief Complaint NSTEMI (non-ST elevated myocardial infarction) [facility]) [I21.4]  Triage Note Coming from home. Chest pain started last night and worsened this morning. Non radiating midsternal sharp and heavy chest pain. BP 200/102 with ems. 324mg  aspirin and 1 nitroglycerin given ems. Patient did spit up a little of chewable aspirin. Denies N/V, abdominal pain.  A+Ox4.   Allergies Allergies  Allergen Reactions   Ciprofloxacin     Acute renal failure   Diphenhydramine Hcl     REACTION: UNKNOWN REACTION   Sulfonamide Derivatives Rash    Level of Care/Admitting Diagnosis ED Disposition     ED Disposition  Admit   Condition  --   Comment  Hospital Area: [facility] [[id number]]  Level of Care: Progressive [102]  Admit to Progressive based on following criteria: CARDIOVASCULAR & THORACIC of moderate stability with acute coronary syndrome symptoms/low risk myocardial infarction/hypertensive urgency/arrhythmias/heart failure potentially compromising stability and stable post cardiovascular intervention patients.  May admit patient to [facility] or [facility] if equivalent level of care is available:: No  Covid Evaluation: Asymptomatic - no recent exposure (last 10 days) testing not required  Diagnosis: NSTEMI (non-ST elevated myocardial infarction) [facility]) [[id number]]  Admitting Physician: Chiquita Loth  Attending Physician: Starleen Arms [[id number]]  Certification:: I certify this patient will need inpatient services for at least 2 midnights  Expected Medical Readiness: 06/12/2023          B Medical/Surgery History Past Medical  History:  Diagnosis Date   Anxiety    Arthritis    CKD (chronic kidney disease)    Dr. Fausto Skillern   Depression    GERD (gastroesophageal reflux disease)    Hyperlipidemia    Hypertension    Osteoporosis    Stroke [facility])    UTI (lower urinary tract infection)    Past Surgical History:  Procedure Laterality Date   CHOLECYSTECTOMY       A IV Location/Drains/Wounds Patient Lines/Drains/Airways Status     Active Line/Drains/Airways     Name Placement date Placement time Site Days   Peripheral IV 06/09/23 20 G Anterior;Proximal;Right Forearm 06/09/23  1625  Forearm  less than 1            Intake/Output Last 24 hours No intake or output data in the 24 hours ending 06/09/23 1652  Labs/Imaging Results for orders placed or performed during the hospital encounter of 06/09/23 (from the past 48 hours)  Basic metabolic panel     Status: Abnormal   Collection Time: 06/09/23  2:54 PM  Result Value Ref Range   Sodium 144 135 - 145 mmol/L   Potassium 4.6 3.5 - 5.1 mmol/L   Chloride 114 (H) 98 - 111 mmol/L   CO2 19 (L) 22 - 32 mmol/L   Glucose, Bld 175 (H) 70 - 99 mg/dL    Comment: Glucose reference range applies only to samples taken after fasting for at least 8 hours.   BUN 33 (H) 8 - 23 mg/dL   Creatinine, Ser 9.56 (H) 0.44 - 1.00 mg/dL   Calcium 8.4 (L) 8.9 - 10.3 mg/dL   GFR, Estimated 20 (L) >60 mL/min    Comment: (NOTE) Calculated  using the CKD-EPI Creatinine Equation (2021)    Anion gap 11 5 - 15    Comment: Performed at [facility], [address] [place], [place] [address]  CBC     Status: Abnormal   Collection Time: 06/09/23  2:54 PM  Result Value Ref Range   WBC 9.1 4.0 - 10.5 K/uL   RBC 2.94 (L) 3.87 - 5.11 MIL/uL   Hemoglobin 9.4 (L) 12.0 - 15.0 g/dL   HCT 56.4 (L) 33.2 - 95.1 %   MCV 105.8 (H) 80.0 - 100.0 fL   MCH 32.0 26.0 - 34.0 pg   MCHC 30.2 30.0 - 36.0 g/dL   RDW 88.4 16.6 - 06.3 %   Platelets 227 150 - 400 K/uL   nRBC 0.0 0.0 - 0.2 %    Comment:  Performed at [facility], [address], [place], [place] [address]  Troponin I (High Sensitivity)     Status: Abnormal   Collection Time: 06/09/23  2:54 PM  Result Value Ref Range   Troponin I (High Sensitivity) 1,233 (HH) <18 ng/L    Comment: CRITICAL RESULT CALLED TO, READ BACK BY AND VERIFIED WITH ABBY C. ON 06/09/2023 @15 :29 BY T.HAMER (NOTE) Elevated high sensitivity troponin I (hsTnI) values and significant  changes across serial measurements may suggest ACS but many other  chronic and acute conditions are known to elevate hsTnI results.  Refer to the "Links" section for chest pain algorithms and additional  guidance. Performed at [facility], [address] [place], [place] [address]    DG Chest Port 1 View Result Date: 06/09/2023 CLINICAL DATA:  Chest pain beginning last night, worse this morning. EXAM: PORTABLE CHEST 1 VIEW COMPARISON:  07/29/2021 and older studies. FINDINGS: Cardiac silhouette normal in size.  No mediastinal or hilar masses. Bilateral irregular interstitial thickening. Patchy areas of hazy airspace opacity in the perihilar and lower lungs. Small pleural effusions. No pneumothorax. Skeletal structures are demineralized, grossly intact. IMPRESSION: Findings consistent with CHF. Electronically Signed   By: Amie Portland [name]   On: 06/09/2023 14:51    Pending Labs Unresulted Labs (From admission, onward)     Start     Ordered   06/10/23 0500  CBC  Daily,   R      06/09/23 1603   06/10/23 0500  Lipoprotein A (LPA)  Tomorrow morning,   R        06/09/23 1651   06/10/23 0100  Heparin level (unfractionated)  Once-Timed,   URGENT        06/09/23 1603   06/09/23 1404  Resp panel by RT-PCR (RSV, Flu A&B, Covid) Anterior Nasal Swab  ONCE - URGENT,   URGENT        06/09/23 1403   Signed and Held  Basic metabolic panel  Tomorrow morning,   R        Signed and Held   Signed and Held  CBC  Tomorrow morning,   R        Signed and Held             Vitals/Pain Today's Vitals   06/09/23 1525 06/09/23 1600 06/09/23 1611 06/09/23 1651  BP: (!) 189/100  (!) 186/112 (!) 173/128  Pulse: 98  95 85  Resp: (!) 28     Temp:      TempSrc:      SpO2: 99%     Weight:  63.8 kg    Height:  4\' 9"  (1.448 m)    PainSc:  Isolation Precautions No active isolations  Medications Medications  atorvastatin (LIPITOR) tablet 80 mg (80 mg Oral Given 06/09/23 1614)  heparin ADULT infusion 100 units/mL (25000 units/243mL) (650 Units/hr Intravenous New Bag/Given 06/09/23 1626)  metoprolol tartrate (LOPRESSOR) tablet 25 mg (25 mg Oral Given 06/09/23 1651)  nitroGLYCERIN (NITROSTAT) SL tablet 0.4 mg (has no administration in time range)  acetaminophen (TYLENOL) tablet 650 mg (has no administration in time range)  ondansetron (ZOFRAN) injection 4 mg (has no administration in time range)  aspirin EC tablet 81 mg (has no administration in time range)  nitroGLYCERIN (NITROSTAT) SL tablet 0.4 mg (0.4 mg Sublingual Given 06/09/23 1523)  nitroGLYCERIN (NITROGLYN) 2 % ointment 1 inch (1 inch Topical Given 06/09/23 1617)  labetalol (NORMODYNE) tablet 100 mg (100 mg Oral Given 06/09/23 1611)  heparin bolus via infusion 3,000 Units (3,000 Units Intravenous Bolus from Bag 06/09/23 1627)  metoprolol tartrate (LOPRESSOR) injection 5 mg (2.5 mg Intravenous Given 06/09/23 1641)    Mobility walks with device     Focused Assessments Cardiac Assessment Handoff:    Lab Results  Component Value Date   CKTOTAL 49 10/03/2007   CKMB 1.9 10/03/2007   TROPONINI 0.02        NO INDICATION OF MYOCARDIAL INJURY. 10/03/2007    Does the Patient currently have chest pain? Yes  Trop 1- 1233   R Recommendations: See Admitting Provider Note  Report given to:   Additional Notes:

## 2023-06-09 NOTE — ED Notes (Signed)
 Called [facility] at this time for transport. [name]

## 2023-06-09 NOTE — ED Provider Notes (Signed)
 [place] EMERGENCY DEPARTMENT AT [facility] Provider Note   CSN: [id number] Arrival date & time: 06/09/23  1346     History  Chief Complaint  Patient presents with   Chest Pain    [name] is a 89 y.o. female with a history including hypertension, hyperlipidemia, history of CVA, GERD and chronic kidney disease who also just recovered from a suspected case of food poisoning, reporting nonbloody diarrhea which resolved 2 days ago with a short course of Zithromax by her PCP, describes having some chest pressure last night which resolved,  then woke early this morning around 5:30 with return of left-sided nonradiating chest pain described as pressure but also sharp with a persistent heavy sensation with deep inspiration. She received chewable aspirin and 1 nitroglycerin prior to arrival and her sx resolved. Denies diaphoresis, n/v, palpitations.   No prior history of similar symptoms.  She has had a nonproductive cough recently, denies fevers or chills.  She believes she was screened for influenza when she saw her PCP 4 days ago, was negative.  I am unable to locate any lab results but was able to see her PCPs initial diagnosis and plan as mentioned above.    The history is provided by the patient and the EMS personnel.       Home Medications Prior to Admission medications   Medication Sig Start Date End Date Taking? Authorizing Provider  acetaminophen (TYLENOL) 650 MG CR tablet Take 650 mg by mouth every 8 (eight) hours as needed for pain.    [provider]  amLODipine (NORVASC) 2.5 MG tablet Take 2.5 mg by mouth daily.    [provider]  atorvastatin (LIPITOR) 20 MG tablet Take 1 tablet (20 mg total) by mouth daily. 04/08/21   Valentino Nose, NP  calcitRIOL (ROCALTROL) 0.25 MCG capsule Take 0.25 mcg by mouth daily.    [provider]  cetirizine (ZYRTEC) 10 MG tablet Take 10 mg by mouth daily. As needed    [provider]   Cranberry-Vitamin C-Vitamin E (CRANBERRY PLUS VITAMIN C) 4200-20-3 MG-MG-UNIT CAPS Take by mouth.    [provider]  dipyridamole-aspirin (AGGRENOX) 200-25 MG 12hr capsule Take 1 capsule by mouth 2 (two) times daily. 04/08/21   Valentino Nose, NP  ferrous sulfate 325 (65 FE) MG EC tablet Take 325 mg by mouth daily with breakfast.    [provider]  labetalol (NORMODYNE) 100 MG tablet Take 1 tablet (100 mg total) by mouth 2 (two) times daily. 04/29/21   Valentino Nose, NP  Omega-3 Fatty Acids (OMEGA-3 FISH OIL PO) Take by mouth.    [provider]  omeprazole (PRILOSEC) 20 MG capsule TAKE 1 CAPSULE(20 MG) BY MOUTH DAILY 04/29/21   Cathlean Marseilles A, NP  sodium bicarbonate 650 MG tablet Take 650 mg by mouth 2 (two) times daily.    [provider]  traMADol (ULTRAM) 50 MG tablet Take 1 tablet (50 mg total) by mouth daily as needed. 07/30/21   Dione Booze, MD      Allergies    Ciprofloxacin, Diphenhydramine hcl, and Sulfonamide derivatives    Review of Systems   Review of Systems  Constitutional:  Negative for chills and fever.  HENT:  Negative for congestion and sore throat.   Eyes: Negative.   Respiratory:  Positive for cough and shortness of breath. Negative for chest tightness.   Cardiovascular:  Positive for chest pain.  Gastrointestinal:  Positive for diarrhea. Negative for abdominal  pain and nausea.       Per hpi, had diarrhea but resolved 2 days ago  Genitourinary: Negative.   Musculoskeletal:  Negative for arthralgias, joint swelling and neck pain.  Skin: Negative.  Negative for rash and wound.  Neurological:  Negative for dizziness, weakness, light-headedness, numbness and headaches.  Psychiatric/Behavioral: Negative.      Physical Exam Updated Vital Signs BP (!) 189/100   Pulse 98   Temp 98.4 F (36.9 C) (Oral)   Resp (!) 28   SpO2 99%  Physical Exam Vitals and nursing note reviewed.  Constitutional:      Appearance: She is  well-developed.  HENT:     Head: Normocephalic and atraumatic.  Eyes:     Conjunctiva/sclera: Conjunctivae normal.  Cardiovascular:     Rate and Rhythm: Normal rate and regular rhythm.     Heart sounds: Normal heart sounds.  Pulmonary:     Effort: Pulmonary effort is normal.     Breath sounds: Wheezing and rales present. No rhonchi.     Comments: Faint expiratory wheeze left lower lung,  bilateral rales.  Abdominal:     General: Bowel sounds are normal.     Palpations: Abdomen is soft.     Tenderness: There is no abdominal tenderness.  Musculoskeletal:        General: Normal range of motion.     Cervical back: Normal range of motion.     Right lower leg: Edema present.     Left lower leg: Edema present.     Comments: Bilateral nonpitting edema ankles.  Skin:    General: Skin is warm and dry.  Neurological:     Mental Status: She is alert.     ED Results / Procedures / Treatments   Labs (all labs ordered are listed, but only abnormal results are displayed) Labs Reviewed  BASIC METABOLIC PANEL - Abnormal; Notable for the following components:      Result Value   Chloride 114 (*)    CO2 19 (*)    Glucose, Bld 175 (*)    BUN 33 (*)    Creatinine, Ser 2.28 (*)    Calcium 8.4 (*)    GFR, Estimated 20 (*)    All other components within normal limits  CBC - Abnormal; Notable for the following components:   RBC 2.94 (*)    Hemoglobin 9.4 (*)    HCT 31.1 (*)    MCV 105.8 (*)    All other components within normal limits  TROPONIN I (HIGH SENSITIVITY) - Abnormal; Notable for the following components:   Troponin I (High Sensitivity) 1,233 (*)    All other components within normal limits  RESP PANEL BY RT-PCR (RSV, FLU A&B, COVID)  [id number]  TROPONIN I (HIGH SENSITIVITY)    EKG EKG Interpretation Date/Time:  Saturday June 09 2023 14:02:24 EDT Ventricular Rate:  93 PR Interval:  171 QRS Duration:  153 QT Interval:  357 QTC Calculation: 444 R Axis:   -17  Text  Interpretation: Sinus rhythm Probable left ventricular hypertrophy Anterior Q waves, possibly due to LVH Abnormal T, consider ischemia, anterior leads ? Wellens Confirmed by Eber Hong ([id number]) on 06/09/2023 3:36:40 PM  Radiology DG Chest Port 1 View Result Date: 06/09/2023 CLINICAL DATA:  Chest pain beginning last night, worse this morning. EXAM: PORTABLE CHEST 1 VIEW COMPARISON:  07/29/2021 and older studies. FINDINGS: Cardiac silhouette normal in size.  No mediastinal or hilar masses. Bilateral irregular interstitial thickening. Patchy areas of hazy airspace opacity  in the perihilar and lower lungs. Small pleural effusions. No pneumothorax. Skeletal structures are demineralized, grossly intact. IMPRESSION: Findings consistent with CHF. Electronically Signed   By: Amie Portland [name]   On: 06/09/2023 14:51    Procedures Procedures    Medications Ordered in ED Medications  nitroGLYCERIN (NITROGLYN) 2 % ointment 1 inch (has no administration in time range)  atorvastatin (LIPITOR) tablet 80 mg (has no administration in time range)  labetalol (NORMODYNE) tablet 100 mg (has no administration in time range)  nitroGLYCERIN (NITROSTAT) SL tablet 0.4 mg (0.4 mg Sublingual Given 06/09/23 1523)    ED Course/ Medical Decision Making/ A&P Clinical Course as of 06/09/23 1559  Sat Jun 09, 2023  1505 At recheck,  bp remains elevated,  pt complaint of chest pressure return, ntg SL x 3 prn ordered.  Of note she has not had her morning home meds [JI]  1541 Advised of initial troponin being elevated at which time review of EKG - had not been printed or released for MD eval.  Printed and reviewed at this time.  [JI]    Clinical Course User Index [JI] Burgess Amor, PA-C                                 Medical Decision Making Pt with h/o htn, hyplipidemia, cva, chronic kidney disease, known ascending aortic aneurysm which pt declined surgical intervention,  cp intermittent since last night.  Ddx concerning  for ACS, nonstemi,  also could represent  pneumonia,  PE, dissection, but no murmur appreciated.  Initial troponin significantly elevated.  Pt was given asa prior to arrival,  ntg given here.  Heparin bolus and drip started.  Other management per cardiology recs below.   Amount and/or Complexity of Data Reviewed Labs: ordered.    Details: Patient with troponin significantly elevated at 1233, she has a blood glucose of 175, no anion gap, BUN is 33, creatinine is 2.28 which is actually little bit better than her baseline creatinine. Radiology: ordered.    Details: Chest x-ray reviewed, mild pleural effusions, suggestion of CHF. ECG/medicine tests: ordered.    Details: Reviewed, rate 93, sinus rhythm with LVH.  Anterior Q waves, abnormal T waves in anterior leads, possible Wellens pattern. Discussion of management or test interpretation with external provider(s): Spoke with Dr. Wyline Mood with cardiology including pt presentation/hx, workup findings.  Recommends hospitalist admission, transfer to [facility] for cardiology consult.  Add lipitor 80 mg and ok to give her missed morning dose of labetalol now.    Spoke with Dr. Randol Kern who agrees with admission.   Risk Prescription drug management. Decision regarding hospitalization.   CRITICAL CARE Performed by: Burgess Amor Total critical care time: 45 minutes Critical care time was exclusive of separately billable procedures and treating other patients. Critical care was necessary to treat or prevent imminent or life-threatening deterioration. Critical care was time spent personally by me on the following activities: development of treatment plan with patient and/or surrogate as well as nursing, discussions with consultants, evaluation of patient's response to treatment, examination of patient, obtaining history from patient or surrogate, ordering and performing treatments and interventions, ordering and review of laboratory studies, ordering and review of  radiographic studies, pulse oximetry and re-evaluation of patient's condition.         Final Clinical Impression(s) / ED Diagnoses Final diagnoses:  NSTEMI (non-ST elevated myocardial infarction) [facility])    Rx / DC Orders ED Discharge  Orders     None         Victoriano Lain 06/09/23 1635    Eber Hong, MD 06/09/23 [phone]

## 2023-06-09 NOTE — Consult Note (Incomplete)
 Cardiology Consultation   Patient ID: [name] MRN: [id number]; DOB: [birth year]  Admit date: 06/09/2023 Date of Consult: 06/10/2023  PCP:  Thana Ates, MD   [facility] Providers Cardiologist:  None        Patient Profile:   [name] is a 89 y.o. female with a history of thoracic aorta aneurysm, prior cerebrovascular accident, hypertension, stage IV chronic kidney disease, gastroesophageal reflux disease, osteoarthritis, and major depressive disorder who is being seen 06/10/2023 for the evaluation of chest pain at the request of Dr. Randol Kern.  History of Present Illness:   Ms. Letson reports earlier yesterday morning she woke up with chest pain located bottom left chest.  She is unable to describe the pain completely but states some shortness of breath with the pain.  Her chest pain lasted several minutes for which emergency medical services were called to the home and patient was sent to [facility] Emergency Department.  She reports some ongoing mild chest pain.    Patient was brought to [facility] Emergency Department for chest pain.  Her initial blood pressure documented was 199/145 mmHg.  ECG performed demonstrated non-specific changes.  Initial HS-troponin was 1,233 ng/L and went to 1,985 ng/L.  Patient was provided aspirin an started on heparin drip. She was given labetalol with some improvement in blood pressure. She was accepted to our hospitalist service with cardiology in consultation.    Of note, patient has known severe thoracic aorta aneurysm involving her ascending aorta.  No diagnostics were performed to evaluate for aorta dissection given severe thoracic aorta aneurysm.    Past Medical History:  Diagnosis Date   Anxiety    Arthritis    CKD (chronic kidney disease)    Dr. Fausto Skillern   Depression    GERD (gastroesophageal reflux disease)    Hyperlipidemia    Hypertension    Osteoporosis    Stroke [facility])    UTI (lower urinary tract  infection)     Past Surgical History:  Procedure Laterality Date   CHOLECYSTECTOMY       Home Medications:  Prior to Admission medications   Medication Sig Start Date End Date Taking? Authorizing Provider  acetaminophen (TYLENOL) 650 MG CR tablet Take 650 mg by mouth every 8 (eight) hours as needed for pain.   Yes [provider]  amLODipine (NORVASC) 2.5 MG tablet Take 2.5 mg by mouth daily.   Yes [provider]  atorvastatin (LIPITOR) 20 MG tablet Take 1 tablet (20 mg total) by mouth daily. 04/08/21  Yes Cathlean Marseilles A, NP  calcitRIOL (ROCALTROL) 0.25 MCG capsule Take 0.25 mcg by mouth daily.   Yes [provider]  cetirizine (ZYRTEC) 10 MG tablet Take 10 mg by mouth daily. As needed   Yes [provider]  Cranberry-Vitamin C-Vitamin E (CRANBERRY PLUS VITAMIN C) 4200-20-3 MG-MG-UNIT CAPS Take by mouth.   Yes [provider]  dipyridamole-aspirin (AGGRENOX) 200-25 MG 12hr capsule Take 1 capsule by mouth 2 (two) times daily. 04/08/21  Yes Valentino Nose, NP  ferrous sulfate 325 (65 FE) MG EC tablet Take 325 mg by mouth daily with breakfast.   Yes [provider]  labetalol (NORMODYNE) 100 MG tablet Take 1 tablet (100 mg total) by mouth 2 (two) times daily. 04/29/21  Yes Valentino Nose, NP  omeprazole (PRILOSEC) 20 MG capsule TAKE 1 CAPSULE(20 MG) BY MOUTH DAILY 04/29/21  Yes Cathlean Marseilles A, NP  sodium bicarbonate 650 MG tablet Take 650  mg by mouth 2 (two) times daily.   Yes [provider]  traMADol (ULTRAM) 50 MG tablet Take 1 tablet (50 mg total) by mouth daily as needed. 07/30/21  Yes Dione Booze, MD  Omega-3 Fatty Acids (OMEGA-3 FISH OIL PO) Take 1 capsule by mouth daily. Patient not taking: Reported on 06/09/2023    [provider]    Inpatient Medications: Scheduled Meds:  aspirin EC  81 mg Oral Daily   atorvastatin  80 mg Oral Daily   metoprolol tartrate  25 mg Oral Q6H   pantoprazole  40 mg  Oral Daily   sodium bicarbonate  650 mg Oral BID   Continuous Infusions:  heparin 650 Units/hr (06/09/23 1626)   PRN Meds: acetaminophen **OR** acetaminophen, acetaminophen, albuterol, nitroGLYCERIN, ondansetron (ZOFRAN) IV  Allergies:    Allergies  Allergen Reactions   Ciprofloxacin     Acute renal failure   Diphenhydramine Hcl     REACTION: UNKNOWN REACTION   Sulfonamide Derivatives Rash    Social History:   Social History   Socioeconomic History   Marital status: Widowed    Spouse name: Not on file   Number of children: Not on file   Years of education: Not on file   Highest education level: Not on file  Occupational History   Not on file  Tobacco Use   Smoking status: Never   Smokeless tobacco: Never  Vaping Use   Vaping status: Never Used  Substance and Sexual Activity   Alcohol use: No   Drug use: No   Sexual activity: Never  Other Topics Concern   Not on file  Social History Narrative   Not on file   Social Drivers of Health   Financial Resource Strain: Not on file  Food Insecurity: No Food Insecurity (06/09/2023)   Hunger Vital Sign    Worried About Running Out of Food in the Last Year: Never true    Ran Out of Food in the Last Year: Never true  Transportation Needs: No Transportation Needs (06/09/2023)   PRAPARE - Administrator, Civil Service (Medical): No    Lack of Transportation (Non-Medical): No  Physical Activity: Not on file  Stress: Not on file  Social Connections: Unknown (06/09/2023)   Social Connection and Isolation Panel [NHANES]    Frequency of Communication with Friends and Family: More than three times a week    Frequency of Social Gatherings with Friends and Family: More than three times a week    Attends Religious Services: Not on file    Active Member of Clubs or Organizations: Not on file    Attends Banker Meetings: 1 to 4 times per year    Marital Status: Not on file  Intimate Partner Violence: Not At  Risk (06/09/2023)   Humiliation, Afraid, Rape, and Kick questionnaire    Fear of Current or Ex-Partner: No    Emotionally Abused: No    Physically Abused: No    Sexually Abused: No    Family History:    Family History  Problem Relation Age of Onset   Cancer Mother    Cancer Father      ROS:  Please see the history of present illness.  All other ROS reviewed and negative.     Physical Exam/Data:   Vitals:   06/09/23 1705 06/09/23 1710 06/09/23 1800 06/09/23 2016  BP: (!) 164/100 (!) 177/97 (!) 159/93 (!) 175/91  Pulse: 87 82 85 80  Resp: (!) 38 (!)  35 20 20  Temp:   98.8 F (37.1 C) 98.1 F (36.7 C)  TempSrc:   Oral Oral  SpO2: 100% 100% 100% 99%  Weight:   62.6 kg   Height:   5\' 1"  (1.549 m)    No intake or output data in the 24 hours ending 06/10/23 0053    06/09/2023    6:00 PM 06/09/2023    4:00 PM 12/19/2022    9:50 AM  Last 3 Weights  Weight (lbs) 138 lb 0.1 oz 140 lb 9.6 oz 138 lb 3.2 oz  Weight (kg) 62.6 kg 63.776 kg 62.687 kg     Body mass index is 26.08 kg/m.  General:  Well nourished, well developed, in no acute distress HEENT: normal Neck: no JVD Vascular: No carotid bruits; Distal pulses 2+ bilaterally Cardiac:  normal S1, S2; RRR; no murmur  Lungs:  clear to auscultation bilaterally, no wheezing, rhonchi or rales  Abd: soft, nontender, no hepatomegaly  Ext: no edema Musculoskeletal:  No deformities, BUE and BLE strength normal and equal Skin: warm and dry  Neuro:  CNs 2-12 intact, no focal abnormalities noted Psych:  Normal affect   EKG:  The EKG was personally reviewed and demonstrates:  06/09/23 (14:02 hours): NSR; non-specific ST and T-wave changes; abnormal ECG.   Telemetry:  Telemetry was personally reviewed and demonstrates:  Sinus rhythm  Relevant CV Studies: # Chest CT angiography 07/29/21: IMPRESSION: 1. No acute intrathoracic, abdominal, or pelvic pathology. 2. Aneurysmal dilatation of the ascending aorta measuring 5.5 cm in diameter.  Recommend semi-annual imaging followup by CTA or MRA and referral to cardiothoracic surgery if not already obtained. This recommendation follows 2010  Laboratory Data:  High Sensitivity Troponin:   Recent Labs  Lab 06/09/23 1454 06/09/23 1655  TROPONINIHS 1,233* 1,985*     Chemistry Recent Labs  Lab 06/09/23 1454  NA 144  K 4.6  CL 114*  CO2 19*  GLUCOSE 175*  BUN 33*  CREATININE 2.28*  CALCIUM 8.4*  GFRNONAA 20*  ANIONGAP 11    No results for input(s): "PROT", "ALBUMIN", "AST", "ALT", "ALKPHOS", "BILITOT" in the last 168 hours. Lipids No results for input(s): "CHOL", "TRIG", "HDL", "LABVLDL", "LDLCALC", "CHOLHDL" in the last 168 hours.  Hematology Recent Labs  Lab 06/09/23 1454  WBC 9.1  RBC 2.94*  HGB 9.4*  HCT 31.1*  MCV 105.8*  MCH 32.0  MCHC 30.2  RDW 14.6  PLT 227   Thyroid No results for input(s): "TSH", "FREET4" in the last 168 hours.  BNPNo results for input(s): "BNP", "PROBNP" in the last 168 hours.  DDimer No results for input(s): "DDIMER" in the last 168 hours.   Radiology/Studies:  DG Chest Port 1 View Result Date: 06/09/2023 CLINICAL DATA:  Chest pain beginning last night, worse this morning. EXAM: PORTABLE CHEST 1 VIEW COMPARISON:  07/29/2021 and older studies. FINDINGS: Cardiac silhouette normal in size.  No mediastinal or hilar masses. Bilateral irregular interstitial thickening. Patchy areas of hazy airspace opacity in the perihilar and lower lungs. Small pleural effusions. No pneumothorax. Skeletal structures are demineralized, grossly intact. IMPRESSION: Findings consistent with CHF. Electronically Signed   By: Amie Portland [name]   On: 06/09/2023 14:51     Assessment and Plan:   Chest pain: Patient with chest pain in the setting of an initial blood pressure of 199/145 mmHg and rising HS-troponin.  Possible hypertensive emergency but cannot exclude a type I myocardial infarction with NSTEMI-ACS.  Cannot exclude aorta dissection.  She  reports  mild chest discomfort at this time.  Patient is hemodynamically stable.  Currently on aspirin and heparin drip. Patient does not want to go dialysis and aorta aneurysm repair was not pursued prior due to her age.    --Given blood pressure improvement with just labetalol, please initiate antihypertensive medications beyond home metoprolol.  Start amlodipine 10 mg daily with at least hydralazine 25 mg twice daily for now.  Can switch metoprolol to low dose carvedilol 12.5 mg for now.  Echocardiogram has been ordered. --Primary team to discuss getting chest CT angiography versus chest MRI.   --Continue heparin drip and aspirin for now.  Pursue ischemic guided strategy given chronic kidney disease and patient refusing dialysis.   Continue heparin for 48 hours and team to consider loading with clopidogrel in the morning once aorta has been further characterized.   --Check lipid panel and initiate atorvastatin 40 mg daily. --Day team to follow up echocardiogram.  Hypertension: Patient with known primary hypertension with noted stage IV chronic kidney disease.  Blood pressure elevated initially upon admission, with an elevated blood pressure in range and with chest pain consistent for hypertensive emergency especially in the setting of thoracic aorta aneurysm.  Blood pressure improved with labetalol and patient is only back on her home metoprolol.   --Blood pressure management as above.    Risk Assessment/Risk Scores:     TIMI Risk Score for Unstable Angina or Non-ST Elevation MI:   The patient's TIMI risk score is 3, which indicates a 13% risk of all cause mortality, new or recurrent myocardial infarction or need for urgent revascularization in the next 14 days.          For questions or updates, please contact [facility] Please consult [email or web address] for contact info under    Signed, Judie Grieve, MD  06/10/2023 12:53 AM

## 2023-06-09 NOTE — Progress Notes (Signed)
 PHARMACY - ANTICOAGULATION CONSULT NOTE  Pharmacy Consult for heparin Indication: chest pain/ACS  Allergies  Allergen Reactions   Ciprofloxacin     Acute renal failure   Diphenhydramine Hcl     REACTION: UNKNOWN REACTION   Sulfonamide Derivatives Rash    Patient Measurements: Height: 4\' 9"  (144.8 cm) Weight: 63.8 kg (140 lb 9.6 oz) IBW/kg (Calculated) : 38.6 Heparin Dosing Weight: 53kg   Vital Signs: Temp: 98.4 F (36.9 C) (03/15 1354) Temp Source: Oral (03/15 1354) BP: 189/100 (03/15 1525) Pulse Rate: 98 (03/15 1525)  Labs: Recent Labs    06/09/23 1454  HGB 9.4*  HCT 31.1*  PLT 227  CREATININE 2.28*  TROPONINIHS 1,233*    Estimated Creatinine Clearance: 12.4 mL/min (A) (by C-G formula based on SCr of 2.28 mg/dL (H)).   Medical History: Past Medical History:  Diagnosis Date   Anxiety    Arthritis    CKD (chronic kidney disease)    Dr. Fausto Skillern   Depression    GERD (gastroesophageal reflux disease)    Hyperlipidemia    Hypertension    Osteoporosis    Stroke [facility])    UTI (lower urinary tract infection)    Assessment: 89 year old female admitted to [facility] with chest pain/nstemi. Cardiac enzymes elevated. Hemoglobin low at 9.4, plt count within normal limits. Patient does not appear to be on blood thinners prior to admit. New orders to start IV heparin.   Goal of Therapy:  Heparin level 0.3-0.7 units/ml Monitor platelets by anticoagulation protocol: Yes   Plan:  Give 3000 units bolus x 1 Start heparin infusion at 650 units/hr Check anti-Xa level in 8 hours and daily while on heparin Continue to monitor H&H and platelets  Sheppard Coil PharmD., BCPS Clinical Pharmacist 06/09/2023 4:02 PM

## 2023-06-10 ENCOUNTER — Inpatient Hospital Stay (HOSPITAL_COMMUNITY)

## 2023-06-10 DIAGNOSIS — I214 Non-ST elevation (NSTEMI) myocardial infarction: Secondary | ICD-10-CM | POA: Diagnosis not present

## 2023-06-10 DIAGNOSIS — N184 Chronic kidney disease, stage 4 (severe): Secondary | ICD-10-CM | POA: Diagnosis not present

## 2023-06-10 DIAGNOSIS — I160 Hypertensive urgency: Secondary | ICD-10-CM

## 2023-06-10 DIAGNOSIS — I10 Essential (primary) hypertension: Secondary | ICD-10-CM | POA: Diagnosis not present

## 2023-06-10 DIAGNOSIS — K219 Gastro-esophageal reflux disease without esophagitis: Secondary | ICD-10-CM | POA: Diagnosis not present

## 2023-06-10 LAB — CBC
HCT: 30.6 % — ABNORMAL LOW (ref 36.0–46.0)
HCT: 31.1 % — ABNORMAL LOW (ref 36.0–46.0)
Hemoglobin: 9.3 g/dL — ABNORMAL LOW (ref 12.0–15.0)
Hemoglobin: 9.6 g/dL — ABNORMAL LOW (ref 12.0–15.0)
MCH: 31.7 pg (ref 26.0–34.0)
MCH: 32.3 pg (ref 26.0–34.0)
MCHC: 30.4 g/dL (ref 30.0–36.0)
MCHC: 30.9 g/dL (ref 30.0–36.0)
MCV: 104.4 fL — ABNORMAL HIGH (ref 80.0–100.0)
MCV: 104.7 fL — ABNORMAL HIGH (ref 80.0–100.0)
Platelets: 210 10*3/uL (ref 150–400)
Platelets: 209 10*3/uL (ref 150–400)
RBC: 2.93 MIL/uL — ABNORMAL LOW (ref 3.87–5.11)
RBC: 2.97 MIL/uL — ABNORMAL LOW (ref 3.87–5.11)
RDW: 14.5 % (ref 11.5–15.5)
RDW: 14.6 % (ref 11.5–15.5)
WBC: 8.5 10*3/uL (ref 4.0–10.5)
WBC: 9.4 10*3/uL (ref 4.0–10.5)
nRBC: 0.2 % (ref 0.0–0.2)
nRBC: 0 % (ref 0.0–0.2)

## 2023-06-10 LAB — TROPONIN I (HIGH SENSITIVITY)
Troponin I (High Sensitivity): 4687 ng/L (ref <18)
Troponin I (High Sensitivity): 5145 ng/L (ref <18)
Troponin I (High Sensitivity): 5242 ng/L (ref <18)
Troponin I (High Sensitivity): 5940 ng/L (ref <18)
Troponin I (High Sensitivity): 5120 ng/L (ref <18)

## 2023-06-10 LAB — LIPID PANEL
Cholesterol: 118 mg/dL (ref 0–200)
HDL: 55 mg/dL (ref >40)
LDL Cholesterol: 52 mg/dL (ref 0–99)
Total CHOL/HDL Ratio: 2.1 ratio
Triglycerides: 54 mg/dL (ref <150)
VLDL: 11 mg/dL (ref 0–40)

## 2023-06-10 LAB — BASIC METABOLIC PANEL
Anion gap: 12 (ref 5–15)
Anion gap: 13 (ref 5–15)
BUN: 36 mg/dL — ABNORMAL HIGH (ref 8–23)
BUN: 38 mg/dL — ABNORMAL HIGH (ref 8–23)
CO2: 17 mmol/L — ABNORMAL LOW (ref 22–32)
CO2: 18 mmol/L — ABNORMAL LOW (ref 22–32)
Calcium: 8.5 mg/dL — ABNORMAL LOW (ref 8.9–10.3)
Calcium: 8.6 mg/dL — ABNORMAL LOW (ref 8.9–10.3)
Chloride: 115 mmol/L — ABNORMAL HIGH (ref 98–111)
Chloride: 113 mmol/L — ABNORMAL HIGH (ref 98–111)
Creatinine, Ser: 2.51 mg/dL — ABNORMAL HIGH (ref 0.44–1.00)
Creatinine, Ser: 2.78 mg/dL — ABNORMAL HIGH (ref 0.44–1.00)
GFR, Estimated: 18 mL/min — ABNORMAL LOW (ref >60)
GFR, Estimated: 16 mL/min — ABNORMAL LOW (ref >60)
Glucose, Bld: 129 mg/dL — ABNORMAL HIGH (ref 70–99)
Glucose, Bld: 112 mg/dL — ABNORMAL HIGH (ref 70–99)
Potassium: 6.1 mmol/L — ABNORMAL HIGH (ref 3.5–5.1)
Potassium: 5.4 mmol/L — ABNORMAL HIGH (ref 3.5–5.1)
Sodium: 144 mmol/L (ref 135–145)
Sodium: 144 mmol/L (ref 135–145)

## 2023-06-10 LAB — HEPARIN LEVEL (UNFRACTIONATED)
Heparin Unfractionated: 0.28 [IU]/mL — ABNORMAL LOW (ref 0.30–0.70)
Heparin Unfractionated: 0.58 [IU]/mL (ref 0.30–0.70)
Heparin Unfractionated: <0.1 [IU]/mL — ABNORMAL LOW (ref 0.30–0.70)

## 2023-06-10 LAB — ECHOCARDIOGRAM COMPLETE
AR max vel: 1.75 cm2
AV Peak grad: 8 mmHg
Ao pk vel: 1.42 m/s
Area-P 1/2: 5.27 cm2
Height: 61 in
MV M vel: 4.84 m/s
MV Peak grad: 93.7 mmHg
P 1/2 time: 199 ms
S' Lateral: 3.5 cm
Weight: 2218.71 [oz_av]

## 2023-06-10 MED ORDER — HYDRALAZINE HCL 20 MG/ML IJ SOLN
10.0000 mg | Freq: Four times a day (QID) | INTRAMUSCULAR | Status: DC | PRN
  Administered 2023-06-10: 10 mg via INTRAVENOUS
  Filled 2023-06-10: qty 1

## 2023-06-10 MED ORDER — CALCITRIOL 0.25 MCG PO CAPS
0.2500 ug | ORAL_CAPSULE | Freq: Every day | ORAL | Status: DC
  Administered 2023-06-10 – 2023-06-13 (×4): 0.25 ug via ORAL
  Filled 2023-06-10 (×4): qty 1

## 2023-06-10 MED ORDER — ASPIRIN-DIPYRIDAMOLE ER 25-200 MG PO CP12
1.0000 | ORAL_CAPSULE | Freq: Two times a day (BID) | ORAL | Status: DC
  Administered 2023-06-11 – 2023-06-12 (×3): 1 via ORAL
  Filled 2023-06-10 (×4): qty 1

## 2023-06-10 MED ORDER — AMLODIPINE BESYLATE 5 MG PO TABS
5.0000 mg | ORAL_TABLET | Freq: Every day | ORAL | Status: DC
  Administered 2023-06-10 – 2023-06-13 (×4): 5 mg via ORAL
  Filled 2023-06-10 (×4): qty 1

## 2023-06-10 MED ORDER — CARVEDILOL 12.5 MG PO TABS
12.5000 mg | ORAL_TABLET | Freq: Two times a day (BID) | ORAL | Status: DC
Start: 2023-06-10 — End: 2023-06-12
  Administered 2023-06-10 – 2023-06-12 (×4): 12.5 mg via ORAL
  Filled 2023-06-10 (×4): qty 1

## 2023-06-10 MED ORDER — FUROSEMIDE 10 MG/ML IJ SOLN
INTRAMUSCULAR | Status: AC
Start: 2023-06-10 — End: 2023-06-10
  Administered 2023-06-10: 40 mg via INTRAVENOUS
  Filled 2023-06-10: qty 4

## 2023-06-10 MED ORDER — FUROSEMIDE 10 MG/ML IJ SOLN: 40.0000 mg | Freq: Once | INTRAMUSCULAR | Status: AC

## 2023-06-10 NOTE — Progress Notes (Signed)
 MD Sreeram made aware of patient's critical troponin of 5145.

## 2023-06-10 NOTE — Progress Notes (Signed)
 Progress Note  Patient Name: [name] Date of Encounter: 06/10/2023  Primary Cardiologist:   None   Subjective   She is resting comfortably and has no chest pain or SOB at present  Inpatient Medications    Scheduled Meds:  amLODipine  5 mg Oral Daily   aspirin EC  81 mg Oral Daily   atorvastatin  80 mg Oral Daily   carvedilol  12.5 mg Oral BID WC   pantoprazole  40 mg Oral Daily   sodium bicarbonate  650 mg Oral BID   Continuous Infusions:  heparin 750 Units/hr (06/10/23 0400)   PRN Meds: acetaminophen **OR** acetaminophen, acetaminophen, albuterol, hydrALAZINE, nitroGLYCERIN, ondansetron (ZOFRAN) IV   Vital Signs    Vitals:   06/10/23 1055 06/10/23 1112 06/10/23 1155 06/10/23 1201  BP: (!) 176/91 (!) 161/101 135/80   Pulse: 79  87   Resp: (!) 27 20 (!) 36 (!) 24  Temp: 98.4 F (36.9 C)     TempSrc: Oral     SpO2: 100%  98%   Weight:      Height:        Intake/Output Summary (Last 24 hours) at 06/10/2023 1326 Last data filed at 06/10/2023 0630 Gross per 24 hour  Intake 123.12 ml  Output 200 ml  Net -76.88 ml   Filed Weights   06/09/23 1600 06/09/23 1800 06/10/23 0521  Weight: 63.8 kg 62.6 kg 62.9 kg    Telemetry    NSR - Personally Reviewed  ECG    NA - Personally Reviewed  Physical Exam   GEN: No acute distress.   Neck: No  JVD Cardiac: RRR, no murmurs, rubs, or gallops.  Respiratory: Clear  to auscultation bilaterally. GI: Soft, nontender, non-distended  MS: No  edema; No deformity. Neuro:  Nonfocal  Psych: Normal affect   Labs    Chemistry Recent Labs  Lab 06/09/23 1454 06/10/23 0826  NA 144 144  K 4.6 5.4*  CL 114* 113*  CO2 19* 18*  GLUCOSE 175* 112*  BUN 33* 38*  CREATININE 2.28* 2.78*  CALCIUM 8.4* 8.6*  GFRNONAA 20* 16*  ANIONGAP 11 13     Hematology Recent Labs  Lab 06/09/23 1454 06/10/23 0826  WBC 9.1 9.4  RBC 2.94* 2.97*  HGB 9.4* 9.6*  HCT 31.1* 31.1*  MCV 105.8* 104.7*  MCH 32.0 32.3  MCHC  30.2 30.9  RDW 14.6 14.6  PLT 227 209    Cardiac EnzymesNo results for input(s): "TROPONINI" in the last 168 hours. No results for input(s): "TROPIPOC" in the last 168 hours.   BNPNo results for input(s): "BNP", "PROBNP" in the last 168 hours.   DDimer No results for input(s): "DDIMER" in the last 168 hours.   Radiology    DG Chest Port 1 View Result Date: 06/09/2023 CLINICAL DATA:  Chest pain beginning last night, worse this morning. EXAM: PORTABLE CHEST 1 VIEW COMPARISON:  07/29/2021 and older studies. FINDINGS: Cardiac silhouette normal in size.  No mediastinal or hilar masses. Bilateral irregular interstitial thickening. Patchy areas of hazy airspace opacity in the perihilar and lower lungs. Small pleural effusions. No pneumothorax. Skeletal structures are demineralized, grossly intact. IMPRESSION: Findings consistent with CHF. Electronically Signed   By: Amie Portland [name]   On: 06/09/2023 14:51    Cardiac Studies   Echo pending.     Patient Profile     89 y.o. female  with a history of thoracic aorta aneurysm, prior cerebrovascular accident, hypertension, stage IV chronic  kidney disease, gastroesophageal reflux disease, osteoarthritis, and major depressive disorder who is being seen 06/10/2023 for the evaluation of chest pain at the request of Dr. Randol Kern.   Assessment & Plan    Chest pain:  She was seen earlier this morning.   See consult note.  Trop elevated.  No acute EKG changes.    Not able to get contrasted study.  Likely not a surgical candidate with advanced age and CKD.  She would not want dialysis.  Echo is a reasonable imaging strategy along with medical management of her BP and pain control.    HTN:    BP is finally coming down.  Norvasc increased this admission.  On Coreg now not labetalol.  Could also use hydralazine.  Her BP is not usually below 140 and sometimes in the 180s.   I think we have lots of room for med titration as an out patient.    Looking through her  previous meds it looks like the only thing she has not tolerated and that has been used is a higher dose of labetalol and the highest dose of Norvasc (swelling).    For questions or updates, please contact [facility] Please consult [email or web address] for contact info under Cardiology/STEMI.   Signed, Rollene Rotunda, MD  06/10/2023, 1:26 PM

## 2023-06-10 NOTE — Progress Notes (Signed)
 PHARMACY - ANTICOAGULATION CONSULT NOTE  Pharmacy Consult for heparin Indication: chest pain/ACS  Labs: Recent Labs    06/09/23 1454 06/09/23 1655 06/10/23 0215 06/10/23 0218 06/10/23 0826 06/10/23 1021 06/10/23 1207 06/10/23 1421 06/10/23 1909  HGB 9.4*  --   --   --  9.6*  --   --   --   --   HCT 31.1*  --   --   --  31.1*  --   --   --   --   PLT 227  --   --   --  209  --   --   --   --   HEPARINUNFRC  --   --  0.28*  --   --  0.58  --   --  <0.10*  CREATININE 2.28*  --   --   --  2.78*  --   --   --   --   TROPONINIHS 1,233*   < >  --    < > 5,145* 5,242* 5,940* 5,120*  --    < > = values in this interval not displayed.   Assessment: 89yo female presenting with NSTEMI. PMH significant for CKD IV, stroke 2018, known AAA. Not on anticoagulation PTA. Pharmacy consulted for heparin management.  Heparin level is <0.1 - appears to be infusing ok at bedside and no known issues per RN. Will increase conservatively and recheck in 8h.   Goal of Therapy:  Heparin level 0.3-0.7 units/ml Monitor platelets by anticoagulation protocol: Yes    Plan:  Increase heparin to 850 units/h Repeat heparin level in 8h  Fredonia Highland, PharmD, [place], [facility] Clinical Pharmacist [phone] Please check AMION for all [facility] Pharmacy numbers 06/10/2023

## 2023-06-10 NOTE — Consult Note (Addendum)
 Reason for Consult: Ascending aneurysm Referring Physician: Dr. Clide Dales, [facility]  [name] is an 89 y.o. female.  HPI: 89 yo woman with known ascending aneurysm presented with CP and SOB.  89 year-old woman with a past history of stroke, stage IV CKD, ascending aneurysm, hypertension, hyperlipidemia, coronary calcification on CT, arthritis, anxiety and depression.  Presented with CP and SOB.  R/I for non-STEMI with troponin of 5000.  Also hypertensive requiring multiple medications for control.  2D echo showed preserved LV systolic function, 6.1 cm ascending aneurysm, severe AI, moderate to severe TR, mild to moderate MR.  Currently pain free.  Was seen by Dr. Cliffton Asters for ascending aneurysm in 2023.  Was 5.5 cm at that time.  Determined NOT to be a surgical candidate.  Past Medical History:  Diagnosis Date   Anxiety    Arthritis    CKD (chronic kidney disease)    Dr. Fausto Skillern   Depression    GERD (gastroesophageal reflux disease)    Hyperlipidemia    Hypertension    Osteoporosis    Stroke [facility])    UTI (lower urinary tract infection)     Past Surgical History:  Procedure Laterality Date   CHOLECYSTECTOMY      Family History  Problem Relation Age of Onset   Cancer Mother    Cancer Father     Social History:  reports that she has never smoked. She has never used smokeless tobacco. She reports that she does not drink alcohol and does not use drugs.  Allergies:  Allergies  Allergen Reactions   Ciprofloxacin     Acute renal failure   Diphenhydramine Hcl     REACTION: UNKNOWN REACTION   Sulfonamide Derivatives Rash    Medications: Scheduled:  amLODipine  5 mg Oral Daily   atorvastatin  80 mg Oral Daily   calcitRIOL  0.25 mcg Oral Daily   carvedilol  12.5 mg Oral BID WC   [START ON 06/11/2023] dipyridamole-aspirin  1 capsule Oral BID   pantoprazole  40 mg Oral Daily   sodium bicarbonate  650 mg Oral BID    Results for orders placed or performed during the  hospital encounter of 06/09/23 (from the past 48 hours)  Basic metabolic panel     Status: Abnormal   Collection Time: 06/09/23  2:54 PM  Result Value Ref Range   Sodium 144 135 - 145 mmol/L   Potassium 4.6 3.5 - 5.1 mmol/L   Chloride 114 (H) 98 - 111 mmol/L   CO2 19 (L) 22 - 32 mmol/L   Glucose, Bld 175 (H) 70 - 99 mg/dL    Comment: Glucose reference range applies only to samples taken after fasting for at least 8 hours.   BUN 33 (H) 8 - 23 mg/dL   Creatinine, Ser 4.13 (H) 0.44 - 1.00 mg/dL   Calcium 8.4 (L) 8.9 - 10.3 mg/dL   GFR, Estimated 20 (L) >60 mL/min    Comment: (NOTE) Calculated using the CKD-EPI Creatinine Equation (2021)    Anion gap 11 5 - 15    Comment: Performed at [facility], [address], [place], [place] [address]  CBC     Status: Abnormal   Collection Time: 06/09/23  2:54 PM  Result Value Ref Range   WBC 9.1 4.0 - 10.5 K/uL   RBC 2.94 (L) 3.87 - 5.11 MIL/uL   Hemoglobin 9.4 (L) 12.0 - 15.0 g/dL   HCT 02.7 (L) 25.3 - 66.4 %   MCV 105.8 (H) 80.0 -  100.0 fL   MCH 32.0 26.0 - 34.0 pg   MCHC 30.2 30.0 - 36.0 g/dL   RDW 57.8 46.9 - 62.9 %   Platelets 227 150 - 400 K/uL   nRBC 0.0 0.0 - 0.2 %    Comment: Performed at [facility], [address], [place], [place] [address]  Troponin I (High Sensitivity)     Status: Abnormal   Collection Time: 06/09/23  2:54 PM  Result Value Ref Range   Troponin I (High Sensitivity) 1,233 (HH) <18 ng/L    Comment: CRITICAL RESULT CALLED TO, READ BACK BY AND VERIFIED WITH ABBY C. ON 06/09/2023 @15 :29 BY T.HAMER (NOTE) Elevated high sensitivity troponin I (hsTnI) values and significant  changes across serial measurements may suggest ACS but many other  chronic and acute conditions are known to elevate hsTnI results.  Refer to the "Links" section for chest pain algorithms and additional  guidance. Performed at [facility], [address] [place], [place] [address]   Troponin I (High Sensitivity)     Status: Abnormal    Collection Time: 06/09/23  4:55 PM  Result Value Ref Range   Troponin I (High Sensitivity) 1,985 (HH) <18 ng/L    Comment: CRITICAL RESULT CALLED TO, READ BACK BY AND VERIFIED WITH ENES,M ON 06/09/23 AT 1738 BY PURDIE,J (NOTE) Elevated high sensitivity troponin I (hsTnI) values and significant  changes across serial measurements may suggest ACS but many other  chronic and acute conditions are known to elevate hsTnI results.  Refer to the "Links" section for chest pain algorithms and additional  guidance. Performed at [facility], [address], [place], [place] [address]   Resp panel by RT-PCR (RSV, Flu A&B, Covid) Anterior Nasal Swab     Status: None   Collection Time: 06/09/23  4:58 PM   Specimen: Anterior Nasal Swab  Result Value Ref Range   SARS Coronavirus 2 by RT PCR NEGATIVE NEGATIVE    Comment: (NOTE) SARS-CoV-2 target nucleic acids are NOT DETECTED.  The SARS-CoV-2 RNA is generally detectable in upper respiratory specimens during the acute phase of infection. The lowest concentration of SARS-CoV-2 viral copies this assay can detect is 138 copies/mL. A negative result does not preclude SARS-Cov-2 infection and should not be used as the sole basis for treatment or other patient management decisions. A negative result may occur with  improper specimen collection/handling, submission of specimen other than nasopharyngeal swab, presence of viral mutation(s) within the areas targeted by this assay, and inadequate number of viral copies(<138 copies/mL). A negative result must be combined with clinical observations, patient history, and epidemiological information. The expected result is Negative.  Fact Sheet for Patients:  BloggerCourse.com  Fact Sheet for Healthcare Providers:  SeriousBroker.it  This test is no t yet approved or cleared by the [place] FDA and  has been authorized for detection and/or diagnosis of  SARS-CoV-2 by FDA under an Emergency Use Authorization (EUA). This EUA will remain  in effect (meaning this test can be used) for the duration of the COVID-19 declaration under Section 564(b)(1) of the Act, 21 U.S.C.section 360bbb-3(b)(1), unless the authorization is terminated  or revoked sooner.       Influenza A by PCR NEGATIVE NEGATIVE   Influenza B by PCR NEGATIVE NEGATIVE    Comment: (NOTE) The Xpert Xpress SARS-CoV-2/FLU/RSV plus assay is intended as an aid in the diagnosis of influenza from Nasopharyngeal swab specimens and should not be used as a sole basis for treatment. Nasal washings and aspirates  are unacceptable for Xpert Xpress SARS-CoV-2/FLU/RSV testing.  Fact Sheet for Patients: BloggerCourse.com  Fact Sheet for Healthcare Providers: SeriousBroker.it  This test is not yet approved or cleared by the [place] FDA and has been authorized for detection and/or diagnosis of SARS-CoV-2 by FDA under an Emergency Use Authorization (EUA). This EUA will remain in effect (meaning this test can be used) for the duration of the COVID-19 declaration under Section 564(b)(1) of the Act, 21 U.S.C. section 360bbb-3(b)(1), unless the authorization is terminated or revoked.     Resp Syncytial Virus by PCR NEGATIVE NEGATIVE    Comment: (NOTE) Fact Sheet for Patients: BloggerCourse.com  Fact Sheet for Healthcare Providers: SeriousBroker.it  This test is not yet approved or cleared by the [place] FDA and has been authorized for detection and/or diagnosis of SARS-CoV-2 by FDA under an Emergency Use Authorization (EUA). This EUA will remain in effect (meaning this test can be used) for the duration of the COVID-19 declaration under Section 564(b)(1) of the Act, 21 U.S.C. section 360bbb-3(b)(1), unless the authorization is terminated or revoked.  Performed at [facility], [address], [place], [place] [address]   Heparin level (unfractionated)     Status: Abnormal   Collection Time: 06/10/23  2:15 AM  Result Value Ref Range   Heparin Unfractionated 0.28 (L) 0.30 - 0.70 IU/mL    Comment: (NOTE) The clinical reportable range upper limit is being lowered to >1.10 to align with the FDA approved guidance for the current laboratory assay.  If heparin results are below expected values, and patient dosage has  been confirmed, suggest follow up testing of antithrombin III levels. Performed at [facility], [address], [place], [place] [address]   Troponin I (High Sensitivity)     Status: Abnormal   Collection Time: 06/10/23  2:18 AM  Result Value Ref Range   Troponin I (High Sensitivity) 4,687 (HH) <18 ng/L    Comment: CRITICAL RESULT CALLED TO, READ BACK BY AND VERIFIED WITH Alda Lea RN @0606  06/10/2023 S. BYRD (NOTE) Elevated high sensitivity troponin I (hsTnI) values and significant  changes across serial measurements may suggest ACS but many other  chronic and acute conditions are known to elevate hsTnI results.  Refer to the "Links" section for chest pain algorithms and additional  guidance. Performed at [facility], [address] [place], [place] [address]   Troponin I (High Sensitivity)     Status: Abnormal   Collection Time: 06/10/23  8:26 AM  Result Value Ref Range   Troponin I (High Sensitivity) 5,145 (HH) <18 ng/L    Comment: CRITICAL VALUE NOTED. VALUE IS CONSISTENT WITH PREVIOUSLY REPORTED/CALLED VALUE (NOTE) Elevated high sensitivity troponin I (hsTnI) values and significant  changes across serial measurements may suggest ACS but many other  chronic and acute conditions are known to elevate hsTnI results.  Refer to the "Links" section for chest pain algorithms and additional  guidance. Performed at [facility], [address] [place], [place] [address]   CBC     Status: Abnormal   Collection Time:  06/10/23  8:26 AM  Result Value Ref Range   WBC 9.4 4.0 - 10.5 K/uL   RBC 2.97 (L) 3.87 - 5.11 MIL/uL   Hemoglobin 9.6 (L) 12.0 - 15.0 g/dL   HCT 29.5 (L) 62.1 - 30.8 %   MCV 104.7 (H) 80.0 - 100.0 fL   MCH 32.3 26.0 - 34.0 pg   MCHC 30.9 30.0 - 36.0  g/dL   RDW 52.8 41.3 - 24.4 %   Platelets 209 150 - 400 K/uL   nRBC 0.0 0.0 - 0.2 %    Comment: Performed at [facility], [address] [place], [place] [address]  Basic metabolic panel     Status: Abnormal   Collection Time: 06/10/23  8:26 AM  Result Value Ref Range   Sodium 144 135 - 145 mmol/L   Potassium 5.4 (H) 3.5 - 5.1 mmol/L   Chloride 113 (H) 98 - 111 mmol/L   CO2 18 (L) 22 - 32 mmol/L   Glucose, Bld 112 (H) 70 - 99 mg/dL    Comment: Glucose reference range applies only to samples taken after fasting for at least 8 hours.   BUN 38 (H) 8 - 23 mg/dL   Creatinine, Ser 2.53 (H) 0.44 - 1.00 mg/dL   Calcium 8.6 (L) 8.9 - 10.3 mg/dL   GFR, Estimated 16 (L) >60 mL/min    Comment: (NOTE) Calculated using the CKD-EPI Creatinine Equation (2021)    Anion gap 13 5 - 15    Comment: Performed at [facility], [address], [place], [place] [address]  Lipid panel     Status: None   Collection Time: 06/10/23  8:26 AM  Result Value Ref Range   Cholesterol 118 0 - 200 mg/dL   Triglycerides 54 <347 mg/dL   HDL 55 >42 mg/dL   Total CHOL/HDL Ratio 2.1 RATIO   VLDL 11 0 - 40 mg/dL   LDL Cholesterol 52 0 - 99 mg/dL    Comment:        Total Cholesterol/HDL:CHD Risk Coronary Heart Disease Risk Table                     Men   Women  1/2 Average Risk   3.4   3.3  Average Risk       5.0   4.4  2 X Average Risk   9.6   7.1  3 X Average Risk  23.4   11.0        Use the calculated Patient Ratio above and the CHD Risk Table to determine the patient's CHD Risk.        ATP III CLASSIFICATION (LDL):  <100     mg/dL   Optimal  595-638  mg/dL   Near or Above                    Optimal  130-159  mg/dL   Borderline  756-433   mg/dL   High  >295     mg/dL   Very High Performed at [facility], [address], [place], [place] [address]   Heparin level (unfractionated)     Status: None   Collection Time: 06/10/23 10:21 AM  Result Value Ref Range   Heparin Unfractionated 0.58 0.30 - 0.70 IU/mL    Comment: (NOTE) The clinical reportable range upper limit is being lowered to >1.10 to align with the FDA approved guidance for the current laboratory assay.  If heparin results are below expected values, and patient dosage has  been confirmed, suggest follow up testing of antithrombin III levels. Performed at [facility], [address] [place], [place] [address]   Troponin I (High Sensitivity)     Status: Abnormal   Collection Time: 06/10/23 10:21 AM  Result Value Ref Range   Troponin I (High Sensitivity) 5,242 (HH) <18 ng/L  Comment: CRITICAL VALUE NOTED. VALUE IS CONSISTENT WITH PREVIOUSLY REPORTED/CALLED VALUE (NOTE) Elevated high sensitivity troponin I (hsTnI) values and significant  changes across serial measurements may suggest ACS but many other  chronic and acute conditions are known to elevate hsTnI results.  Refer to the "Links" section for chest pain algorithms and additional  guidance. Performed at [facility], [address] [place], [place] [address]   Troponin I (High Sensitivity)     Status: Abnormal   Collection Time: 06/10/23 12:07 PM  Result Value Ref Range   Troponin I (High Sensitivity) 5,940 (HH) <18 ng/L    Comment: CRITICAL VALUE NOTED. VALUE IS CONSISTENT WITH PREVIOUSLY REPORTED/CALLED VALUE (NOTE) Elevated high sensitivity troponin I (hsTnI) values and significant  changes across serial measurements may suggest ACS but many other  chronic and acute conditions are known to elevate hsTnI results.  Refer to the "Links" section for chest pain algorithms and additional  guidance. Performed at [facility], [address] [place], [place] [address]    Troponin I (High Sensitivity)     Status: Abnormal   Collection Time: 06/10/23  2:21 PM  Result Value Ref Range   Troponin I (High Sensitivity) 5,120 (HH) <18 ng/L    Comment: CRITICAL VALUE NOTED. VALUE IS CONSISTENT WITH PREVIOUSLY REPORTED/CALLED VALUE (NOTE) Elevated high sensitivity troponin I (hsTnI) values and significant  changes across serial measurements may suggest ACS but many other  chronic and acute conditions are known to elevate hsTnI results.  Refer to the "Links" section for chest pain algorithms and additional  guidance. Performed at [facility], [address] [place], [place] [address]     ECHOCARDIOGRAM COMPLETE Result Date: 06/10/2023    ECHOCARDIOGRAM REPORT   Patient Name:   [name] Date of Exam: 06/10/2023 Medical Rec #:  [id number]         Height:       61.0 in Accession #:    [id number]        Weight:       138.7 lb Date of Birth:  1931/03/21          BSA:          1.617 m Patient Age:    89 years          BP:           145/94 mmHg Patient Gender: F                 HR:           85 bpm. Exam Location:  Inpatient Procedure: 2D Echo, Cardiac Doppler and Color Doppler (Both Spectral and Color            Flow Doppler were utilized during procedure). Indications:    NSTEMI I21.4  History:        Patient has no prior history of Echocardiogram examinations.                 Previous Myocardial Infarction, CKD and Stroke; Risk                 Factors:Hypertension and Dyslipidemia.  Sonographer:    Lucendia Herrlich RCS Referring Phys: [id number] DAWOOD S ELGERGAWY IMPRESSIONS  1. Aneurysm of the ascending aorta, measuring 61 mm.  2. Left ventricular ejection fraction, by estimation, is 50 to 55%. The left ventricle has low normal function. The left ventricle demonstrates global hypokinesis. Left ventricular diastolic parameters are indeterminate.  3.  Right ventricular systolic function is normal. The right ventricular size is normal. There is moderately elevated  pulmonary artery systolic pressure.  4. Left atrial size was mildly dilated.  5. The mitral valve is normal in structure. Mild to moderate mitral valve regurgitation. No evidence of mitral stenosis.  6. Tricuspid valve regurgitation is moderate to severe.  7. The aortic valve is normal in structure. Aortic valve regurgitation is severe. No aortic stenosis is present.  8. The inferior vena cava is dilated in size with <50% respiratory variability, suggesting right atrial pressure of 15 mmHg. FINDINGS  Left Ventricle: Left ventricular ejection fraction, by estimation, is 50 to 55%. The left ventricle has low normal function. The left ventricle demonstrates global hypokinesis. The left ventricular internal cavity size was normal in size. There is no left ventricular hypertrophy. Left ventricular diastolic parameters are indeterminate. Right Ventricle: The right ventricular size is normal. No increase in right ventricular wall thickness. Right ventricular systolic function is normal. There is moderately elevated pulmonary artery systolic pressure. The tricuspid regurgitant velocity is 3.26 m/s, and with an assumed right atrial pressure of 15 mmHg, the estimated right ventricular systolic pressure is 57.5 mmHg. Left Atrium: Left atrial size was mildly dilated. Right Atrium: Right atrial size was normal in size. Pericardium: There is no evidence of pericardial effusion. Mitral Valve: The mitral valve is normal in structure. Mild to moderate mitral valve regurgitation. No evidence of mitral valve stenosis. Tricuspid Valve: The tricuspid valve is normal in structure. Tricuspid valve regurgitation is moderate to severe. No evidence of tricuspid stenosis. Aortic Valve: The aortic valve is normal in structure. Aortic valve regurgitation is severe. Aortic regurgitation PHT measures 199 msec. No aortic stenosis is present. Aortic valve peak gradient measures 8.0 mmHg. Pulmonic Valve: The pulmonic valve was normal in structure.  Pulmonic valve regurgitation is mild to moderate. No evidence of pulmonic stenosis. Aorta: There is an aneurysm involving the ascending aorta measuring 61 mm. Venous: The inferior vena cava is dilated in size with less than 50% respiratory variability, suggesting right atrial pressure of 15 mmHg. IAS/Shunts: No atrial level shunt detected by color flow Doppler.  LEFT VENTRICLE PLAX 2D LVIDd:         4.80 cm   Diastology LVIDs:         3.50 cm   LV e' medial:    3.68 cm/s LV PW:         0.90 cm   LV E/e' medial:  20.1 LV IVS:        0.60 cm   LV e' lateral:   6.22 cm/s LVOT diam:     1.90 cm   LV E/e' lateral: 11.9 LV SV:         45 LV SV Index:   28 LVOT Area:     2.84 cm                           3D Volume EF:                          3D EF:        52 %                          LV EDV:       147 ml  LV ESV:       71 ml                          LV SV:        76 ml RIGHT VENTRICLE            IVC RV S prime:     9.20 cm/s  IVC diam: 2.70 cm TAPSE (M-mode): 1.6 cm LEFT ATRIUM             Index        RIGHT ATRIUM           Index LA diam:        3.80 cm 2.35 cm/m   RA Area:     12.30 cm LA Vol (A2C):   47.4 ml 29.32 ml/m  RA Volume:   26.80 ml  16.58 ml/m LA Vol (A4C):   76.6 ml 47.39 ml/m LA Biplane Vol: 64.2 ml 39.72 ml/m  AORTIC VALVE AV Area (Vmax): 1.75 cm AV Vmax:        141.50 cm/s AV Peak Grad:   8.0 mmHg LVOT Vmax:      87.17 cm/s LVOT Vmean:     55.900 cm/s LVOT VTI:       0.158 m AI PHT:         199 msec  AORTA Ao Root diam: 3.40 cm Ao Asc diam:  6.10 cm MITRAL VALVE                TRICUSPID VALVE MV Area (PHT): 5.27 cm     TR Peak grad:   42.5 mmHg MV Decel Time: 144 msec     TR Vmax:        326.00 cm/s MR Peak grad: 93.7 mmHg MR Vmax:      484.00 cm/s   SHUNTS MV E velocity: 73.90 cm/s   Systemic VTI:  0.16 m MV A velocity: 107.00 cm/s  Systemic Diam: 1.90 cm MV E/A ratio:  0.69 Kardie Tobb DO Electronically signed by Thomasene Ripple DO Signature Date/Time: 06/10/2023/5:12:56 PM     Final    DG Chest Port 1 View Result Date: 06/09/2023 CLINICAL DATA:  Chest pain beginning last night, worse this morning. EXAM: PORTABLE CHEST 1 VIEW COMPARISON:  07/29/2021 and older studies. FINDINGS: Cardiac silhouette normal in size.  No mediastinal or hilar masses. Bilateral irregular interstitial thickening. Patchy areas of hazy airspace opacity in the perihilar and lower lungs. Small pleural effusions. No pneumothorax. Skeletal structures are demineralized, grossly intact. IMPRESSION: Findings consistent with CHF. Electronically Signed   By: Amie Portland [name]   On: 06/09/2023 14:51    Review of Systems  Constitutional:  Positive for activity change.  Respiratory:  Positive for shortness of breath.   Cardiovascular:  Positive for chest pain and leg swelling.   Blood pressure (!) 161/84, pulse 83, temperature 98.3 F (36.8 C), temperature source Oral, resp. rate (!) 32, height 5\' 1"  (1.549 m), weight 62.9 kg, SpO2 100%. Physical Exam Vitals reviewed.  Constitutional:      General: She is not in acute distress. HENT:     Head: Normocephalic and atraumatic.  Cardiovascular:     Rate and Rhythm: Normal rate and regular rhythm.     Heart sounds: Murmur (systolic and diastolic) heard.  Pulmonary:     Effort: Pulmonary effort is normal. No respiratory distress.  Abdominal:     General: There is no distension.  Palpations: Abdomen is soft.  Musculoskeletal:     Right lower leg: Edema present.     Left lower leg: Edema present.  Skin:    General: Skin is warm and dry.  Neurological:     General: No focal deficit present.     Mental Status: She is alert and oriented to person, place, and time.     Assessment/Plan: [name] is a 89 year-old with a past history of stroke, stage IV CKD, ascending aneurysm, hypertension, hyperlipidemia, coronary calcification on CT, arthritis, anxiety and depression.    Presented with CP and ruled in for non-STEMI.  No chest pain at  present.  Echo estimated ascending aneurysm at 6.1 cm (5.5 cm in 2023).  Given her advance age and co-morbities, she is  NOT a candidate for elective or emergent surgery for her ascending aneurysm.  Medical management with BP control    Loreli Slot 06/10/2023, 5:58 PM   Patient's daughter and other family members were present at the time of the exam and discussion.

## 2023-06-10 NOTE — Progress Notes (Signed)
 Progress Note   Patient: [name] WUX:[id number] DOB: 1930-08-13 DOA: 06/09/2023     1 DOS: the patient was seen and examined on 06/10/2023   Brief hospital course: [name]  is a 89 y.o. female,with history of anxiety, CKD stage IV, hypertension, hyperlipidemia, stroke presented for evaluation of chest pain.  ED workup showed elevated troponin, elevated serum creatinine.  She is started on heparin drip per protocol.  Patient is admitted to the hospitalist service for further cardiac workup.  Assessment and Plan: NSTEMI She presented with chest pain, elevated troponins, she is currently chest pain-free. Continue with heparin drip weight-based protocol. Cardiology evaluation and follow-up is appreciated.  Given her kidney dysfunction unable to get CTA chest.  No plan for cardiac cath at this time given her age, CKD.  Advised echo, BP control. Continue to monitor APTT. Restarted Aggrenox, started high dose statin therapy.   NTG as needed for chest pain. 2D echo is pending. Metoprolol changed to carvedilol 12.5 twice daily.  CKD stage IV Continue to monitor daily renal function. Avoid nephrotoxic drugs. No contrast study or cath planned at this time.   Hypertensive urgency Metoprolol changed to carvedilol therapy. Started Norvasc 5 mg which can be increased to 10 if needed. IV as needed hydralazine ordered   Mixed hyperlipidemia Continue high-dose statin   GERD (gastroesophageal reflux disease) Continue PPI   History of stroke Will resume home Aggrenox therapy.     Out of bed to chair. Incentive spirometry. Nursing supportive care. Fall, aspiration precautions. Diet:  Diet Orders (From admission, onward)     Start     Ordered   06/09/23 1726  Diet Heart Room service appropriate? Yes; Fluid consistency: Thin  Diet effective now       Question Answer Comment  Room service appropriate? Yes   Fluid consistency: Thin      06/09/23 1725           DVT  prophylaxis: Heparin drip   Code Status: Full Code  Subjective: Patient is seen and examined today morning. She has her family at bedside. Denies chest pain. Has been short of breath.  Physical Exam: Vitals:   06/10/23 1055 06/10/23 1112 06/10/23 1155 06/10/23 1201  BP: (!) 176/91 (!) 161/101 135/80   Pulse: 79  87   Resp: (!) 27 20 (!) 36 (!) 24  Temp: 98.4 F (36.9 C)     TempSrc: Oral     SpO2: 100%  98%   Weight:      Height:        General - Elderly Caucasian female, mild respiratory distress HEENT - PERRLA, EOMI, atraumatic head, non tender sinuses. Lung - Clear, basal rales, no rhonchi, wheezes. Heart - S1, S2 heard, no murmurs, rubs, trace pedal edema. Abdomen - Soft, non tender, bowel sounds good Neuro - Alert, awake and oriented x 3, non focal exam. Skin - Warm and dry.  Data Reviewed:      Latest Ref Rng & Units 06/10/2023    8:26 AM 06/09/2023    2:54 PM 09/23/2021    4:04 AM  CBC  WBC 4.0 - 10.5 K/uL 9.4  9.1  5.8   Hemoglobin 12.0 - 15.0 g/dL 9.6  9.4  25.3   Hematocrit 36.0 - 46.0 % 31.1  31.1  33.5   Platelets 150 - 400 K/uL 209  227  157       Latest Ref Rng & Units 06/10/2023    8:26 AM 06/09/2023  2:54 PM 09/24/2021    5:06 AM  BMP  Glucose 70 - 99 mg/dL 161  096  76   BUN 8 - 23 mg/dL 38  33  49   Creatinine 0.44 - 1.00 mg/dL 0.45  4.09  8.11   Sodium 135 - 145 mmol/L 144  144  143   Potassium 3.5 - 5.1 mmol/L 5.4  4.6  3.7   Chloride 98 - 111 mmol/L 113  114  121   CO2 22 - 32 mmol/L 18  19  17    Calcium 8.9 - 10.3 mg/dL 8.6  8.4  7.7    DG Chest Port 1 View Result Date: 06/09/2023 CLINICAL DATA:  Chest pain beginning last night, worse this morning. EXAM: PORTABLE CHEST 1 VIEW COMPARISON:  07/29/2021 and older studies. FINDINGS: Cardiac silhouette normal in size.  No mediastinal or hilar masses. Bilateral irregular interstitial thickening. Patchy areas of hazy airspace opacity in the perihilar and lower lungs. Small pleural effusions. No  pneumothorax. Skeletal structures are demineralized, grossly intact. IMPRESSION: Findings consistent with CHF. Electronically Signed   By: Amie Portland [name]   On: 06/09/2023 14:51    Family Communication: Discussed with patient, family at bedside. They understand and agree. All questions answered.  Disposition: Status is: Inpatient Remains inpatient appropriate because: Cardiac workup for NSTEMI. Planned Discharge Destination: Home with Home Health     MDM level 3- Patient admitted with chest pain, trending up troponin. She will need close hemodynamic, telemetry monitoring.  Patient is on IV heparin drip per protocol.  Patient is at high risk for sudden clinical deterioration.  Author: Marcelino Duster, MD 06/10/2023 1:37 PM Secure chat 7am to 7pm For on call review [email or web address].

## 2023-06-10 NOTE — Progress Notes (Signed)
 Date and time results received: 06/10/23 0218  Test: troponin  Critical Value: [id number]  Name of Provider Notified: Loney Loh and Orson Aloe

## 2023-06-10 NOTE — Progress Notes (Signed)
 PHARMACY - ANTICOAGULATION CONSULT NOTE  Pharmacy Consult for heparin Indication: chest pain/ACS  Labs: Recent Labs    06/09/23 1454 06/09/23 1655 06/10/23 0215  HGB 9.4*  --   --   HCT 31.1*  --   --   PLT 227  --   --   HEPARINUNFRC  --   --  0.28*  CREATININE 2.28*  --   --   TROPONINIHS 1,233* 1,985*  --    Assessment: 89yo female subtherapeutic on heparin with initial dosing for CP; no infusion issues or signs of bleeding per RN.  Goal of Therapy:  Heparin level 0.3-0.7 units/ml   Plan:  Increase heparin infusion by 1-2 units/kg/hr to 750 units/hr. Check level in 8 hours.   Vernard Gambles, PharmD, BCPS 06/10/2023 3:59 AM

## 2023-06-10 NOTE — Progress Notes (Addendum)
 PHARMACY - ANTICOAGULATION CONSULT NOTE  Pharmacy Consult for heparin Indication: chest pain/ACS  Labs: Recent Labs    06/09/23 1454 06/09/23 1655 06/10/23 0215 06/10/23 0218 06/10/23 0826 06/10/23 1021  HGB 9.4*  --   --   --  9.6*  --   HCT 31.1*  --   --   --  31.1*  --   PLT 227  --   --   --  209  --   HEPARINUNFRC  --   --  0.28*  --   --  0.58  CREATININE 2.28*  --   --   --  2.78*  --   TROPONINIHS 1,233*   < >  --  4,687* 5,145* 5,242*   < > = values in this interval not displayed.   Assessment: 89yo female presenting with NSTEMI. PMH significant for CKD IV, stroke 2018, known AAA. Not on anticoagulation PTA. Pharmacy consulted for heparin management.  Heparin level 0.58 is therapeutic with heparin running at 750 units/hr. Hgb (9.6) and PLTs (209) are stable. Per RN, no report of pauses, issues with the line, or signs of bleeding.    Goal of Therapy:  Heparin level 0.3-0.7 units/ml Monitor platelets by anticoagulation protocol: Yes    Plan:  Continue heparin infusion at 750 units/hr Check confirmatory heparin level in 8 hours Monitor daily heparin level, CBC, signs/symptoms of bleeding   Ernestene Kiel, PharmD PGY1 Pharmacy Resident  Please check AMION for all [facility] Pharmacy phone numbers After 10:00 PM, call Main Pharmacy [phone] 06/10/2023 12:29 PM

## 2023-06-10 NOTE — Progress Notes (Signed)
 Echocardiogram 2D Echocardiogram has been performed.  [name] 06/10/2023, 5:07 PM

## 2023-06-10 NOTE — Plan of Care (Signed)

## 2023-06-11 DIAGNOSIS — I10 Essential (primary) hypertension: Secondary | ICD-10-CM | POA: Diagnosis not present

## 2023-06-11 DIAGNOSIS — I214 Non-ST elevation (NSTEMI) myocardial infarction: Secondary | ICD-10-CM | POA: Diagnosis not present

## 2023-06-11 DIAGNOSIS — N184 Chronic kidney disease, stage 4 (severe): Secondary | ICD-10-CM | POA: Diagnosis not present

## 2023-06-11 DIAGNOSIS — K219 Gastro-esophageal reflux disease without esophagitis: Secondary | ICD-10-CM | POA: Diagnosis not present

## 2023-06-11 LAB — CBC
HCT: 28.7 % — ABNORMAL LOW (ref 36.0–46.0)
Hemoglobin: 8.9 g/dL — ABNORMAL LOW (ref 12.0–15.0)
MCH: 32 pg (ref 26.0–34.0)
MCHC: 31 g/dL (ref 30.0–36.0)
MCV: 103.2 fL — ABNORMAL HIGH (ref 80.0–100.0)
Platelets: 198 10*3/uL (ref 150–400)
RBC: 2.78 MIL/uL — ABNORMAL LOW (ref 3.87–5.11)
RDW: 14.6 % (ref 11.5–15.5)
WBC: 10 10*3/uL (ref 4.0–10.5)
nRBC: 0.4 % — ABNORMAL HIGH (ref 0.0–0.2)

## 2023-06-11 LAB — APTT: aPTT: 27 s (ref 24–36)

## 2023-06-11 LAB — HEPARIN LEVEL (UNFRACTIONATED): Heparin Unfractionated: <0.1 [IU]/mL — ABNORMAL LOW (ref 0.30–0.70)

## 2023-06-11 LAB — BASIC METABOLIC PANEL
Anion gap: 8 (ref 5–15)
BUN: 45 mg/dL — ABNORMAL HIGH (ref 8–23)
CO2: 21 mmol/L — ABNORMAL LOW (ref 22–32)
Calcium: 8.1 mg/dL — ABNORMAL LOW (ref 8.9–10.3)
Chloride: 113 mmol/L — ABNORMAL HIGH (ref 98–111)
Creatinine, Ser: 3.08 mg/dL — ABNORMAL HIGH (ref 0.44–1.00)
GFR, Estimated: 14 mL/min — ABNORMAL LOW (ref >60)
Glucose, Bld: 103 mg/dL — ABNORMAL HIGH (ref 70–99)
Potassium: 5.3 mmol/L — ABNORMAL HIGH (ref 3.5–5.1)
Sodium: 142 mmol/L (ref 135–145)

## 2023-06-11 LAB — LIPOPROTEIN A (LPA): Lipoprotein (a): 21.5 nmol/L (ref <75.0)

## 2023-06-11 LAB — TROPONIN I (HIGH SENSITIVITY)
Troponin I (High Sensitivity): 5638 ng/L (ref <18)
Troponin I (High Sensitivity): 5825 ng/L (ref <18)

## 2023-06-11 NOTE — Plan of Care (Signed)
  Problem: Clinical Measurements: Goal: Ability to maintain clinical measurements within normal limits will improve Outcome: Progressing Goal: Cardiovascular complication will be avoided Outcome: Progressing   Problem: Nutrition: Goal: Adequate nutrition will be maintained Outcome: Progressing   

## 2023-06-11 NOTE — Progress Notes (Signed)
 PHARMACY - ANTICOAGULATION CONSULT NOTE  Pharmacy Consult for heparin Indication: chest pain/ACS  Heparin DW = 60 kg  Labs: Recent Labs    06/09/23 1454 06/09/23 1655 06/10/23 0826 06/10/23 1021 06/10/23 1207 06/10/23 1421 06/10/23 1909 06/10/23 2331 06/11/23 0245 06/11/23 0745  HGB 9.4*  --  9.6*  --   --   --   --   --  8.9*  --   HCT 31.1*  --  31.1*  --   --   --   --   --  28.7*  --   PLT 227  --  209  --   --   --   --   --  198  --   HEPARINUNFRC  --    < >  --  0.58  --   --  <0.10*  --   --  <0.10*  CREATININE 2.28*  --  2.78*  --   --   --   --   --  3.08*  --   TROPONINIHS 1,233*   < > 5,145* 5,242*   < > 5,120*  --  5,825* 5,638*  --    < > = values in this interval not displayed.   Assessment: 89yo female presenting with NSTEMI. PMH significant for CKD IV, stroke 2018, known AAA. Not on anticoagulation PTA. Pharmacy consulted for heparin management.  Heparin level undetectable, add-on aPTT also subtherapeutic despite rate increase to 850 units/hr.  Of note, was therapeutic (HL 0.58) on 750 units/hr.  Hgb (8.9) and PLTs (198) slightly down. Per RN, no report of pauses, issues with the line, or signs of bleeding.   No plans for [facility] - 48h medical management to end 3/17 @1630  (stop time entered, RN aware)  Goal of Therapy:  Heparin level 0.3-0.7 units/ml Monitor platelets by anticoagulation protocol: Yes    Plan:  Increase heparin infusion to 1050 units/hr Stop time entered for 317 @1630  to complete 48h  Trixie Rude, PharmD Clinical Pharmacist 06/11/2023  9:01 AM

## 2023-06-11 NOTE — Progress Notes (Addendum)
 Patient Name: ASMARA BACKS Date of Encounter: 06/11/2023 [facility] Cardiologist: Rollene Rotunda, MD   Interval Summary  .    Patient resting comfortably in bed, eating breakfast, family present  She is currently on 3 L oxygen via [place]  She reports that she is feeling better today than she has been, denies any shortness of breath, lightheadedness, headaches  Vital Signs .    Vitals:   06/11/23 0014 06/11/23 0400 06/11/23 0438 06/11/23 0750  BP: (!) 143/89  (!) 144/85 (!) 155/94  Pulse: 82  80 80  Resp: 17  20 (!) 24  Temp:   98.9 F (37.2 C) 98.2 F (36.8 C)  TempSrc:   Oral Oral  SpO2: 100%  100% 100%  Weight:  64.7 kg    Height:       Intake/Output Summary (Last 24 hours) at 06/11/2023 1005 Last data filed at 06/11/2023 0000 Gross per 24 hour  Intake 131.84 ml  Output 400 ml  Net -268.16 ml      06/11/2023    4:00 AM 06/10/2023    5:21 AM 06/09/2023    6:00 PM  Last 3 Weights  Weight (lbs) 142 lb 10.2 oz 138 lb 10.7 oz 138 lb 0.1 oz  Weight (kg) 64.7 kg 62.9 kg 62.6 kg      Telemetry/ECG    Sinus rhythm, HR 70-80s - Personally Reviewed  Physical Exam .   GEN: No acute distress, on 3 L oxygen via [place].   Neck: No JVD Cardiac: RRR, no murmurs, rubs, or gallops.  Respiratory: good air movement, mild crackles bilaterally. GI: Soft, nontender, non-distended  MS: bilateral LE edema  Assessment & Plan .   Emara Lichter is a 89yo female with history of thoracic aortic aneurysm, prior CVA, HTN, HLD, CKD stage 4, GERD, OA and major depressive disorder who was being seen for chest pain.  NSTEMI Coronary calcifications noted on CT 07/2021 Mild to moderate MR, moderate to severe TR, severe AR Patient presented on 3/15 with chest pain Troponin 1,233 peaked at 5,940 now trending down No ST elevation noted on EKGs, T wave inversion noted in anterior leads  Echo 06/10/2023 showed: EF 50-55%, global hypokinesis, normal RV function, moderately elevated PASP,  mild dilation of LA, severe AR, mild to moderate MR, moderate to severe TR, dilated IVC  With multiple co-morbidities, CKD stage 4, and advanced age -- reasonable to continue treating her NSTEMI medically  Consider getting palliative care just for goals of care discussion as she remains full code at this time, defer to primary  Continue on home Aggrenox (from prior stroke) Continue Lipitor 80 mg daily  Continue with IV heparin for 48 hours, dosing per pharmacy   Hypertension  Hypertensive urgency  Presented with BP 199/145 Most recent BP 155/94 Continue amlodipine 5 mg daily -- first dose was 3/16 Continue CoReg 12.5 mg BID -- today will be first day of BID dosing  Continue PRN IV hydralazine  Plenty of room for further titration of medications over the next day or as outpatient   Hyperlipidemia  06/10/2023: HDL 55; LDL Cholesterol 52  Continue Lipitor 80 mg daily   AAA  Measured 6.1 cm on echo -- previously noted to be 5.5 cm in 2023  Was previously seen in 2023 and determined to not be a surgical candidate  Seen by CT surgery Determined to not be a candidate for elective or emergent surgery given her advanced age and co-morbidities Continue with BP control --  will titrate slowly over the next couple of days   Per primary GERD CKD stage 4 History of stroke, 2018 -- on Aggrenox    For questions or updates, please contact [facility] Please consult [email or web address] for contact info under        Signed, Olena Leatherwood, PA-C   ATTENDING ATTESTATION:  After conducting a review of all available clinical information with the care team, interviewing the patient, and performing a physical exam, I agree with the findings and plan described in this note.   GEN: No acute distress.  Frail HEENT:  MMM, no JVD, no scleral icterus Cardiac: RRR, no murmurs, rubs, or gallops.  Respiratory: Coarse GI: Soft, nontender, non-distended  MS: No edema; No deformity. Neuro:   Nonfocal  Vasc:  +2 radial pulses  Patient without chest pain and breathing is somewhat improved.  Given advanced age, frailty, and CKD IV, not a candidate for invasive procedures or aneurysm repair.  Agreww with ongoing medical therapy with Aggrenox, atorvastatin 80, amlodipine 5mg .  Consider addressing code status.  Alverda Skeans, MD Pager [phone]

## 2023-06-11 NOTE — TOC Initial Note (Signed)
 Transition of Care [facility]) - Initial/Assessment Note    Patient Details  Name: [name] MRN: [id number] Date of Birth: 30-Mar-1930  [facility]) CM/SW Contact:    Leone Haven, RN Phone Number: 06/11/2023, 4:27 PM  Clinical Narrative:                 NCM approached room to speak with patient, room filled with family members,  NCM asked patient if it is ok to speak in front of family to her, she states yes,  and her daughter Dois Davenport is the person she lives with at home,, has PCP and insurance on file, states has no HH services in place at this time , she has rolling walker and a cane and a w/chair at home, she uses the walker most of the time, and w/chair when she goes out.  States Dois Davenport or Sheralyn Boatman  will transport her home at [employer] and family is support system, states gets medications from [facility] on [address].  Pta self ambulatory with rolling walker.   Expected Discharge Plan: Home w Home Health Services Barriers to Discharge: Continued Medical Work up   Patient Goals and CMS Choice Patient states their goals for this hospitalization and ongoing recovery are:: return home   Choice offered to / list presented to : NA      Expected Discharge Plan and Services   Discharge Planning Services: CM Consult Post Acute Care Choice: NA Living arrangements for the past 2 months: Single Family Home                 DME Arranged: N/A DME Agency: NA       HH Arranged: NA          Prior Living Arrangements/Services Living arrangements for the past 2 months: Single Family Home Lives with:: Adult Children Dois Davenport) Patient language and need for interpreter reviewed:: Yes Do you feel safe going back to the place where you live?: Yes      Need for Family Participation in Patient Care: Yes (Comment) Care giver support system in place?: Yes (comment) Current home services: DME (walker, cane, w/chair) Criminal Activity/Legal Involvement Pertinent to Current  Situation/Hospitalization: No - Comment as needed  Activities of Daily Living      Permission Sought/Granted Permission sought to share information with : Case Manager Permission granted to share information with : Yes, Verbal Permission Granted  Share Information with NAME: Dois Davenport and Sheralyn Boatman     Permission granted to share info w Relationship: daughters  Permission granted to share info w Contact Information: Dois Davenport [phone]  Emotional Assessment Appearance:: Appears stated age Attitude/Demeanor/Rapport: Engaged Affect (typically observed): Appropriate Orientation: : Oriented to Self, Oriented to Place, Oriented to  Time, Oriented to Situation Alcohol / Substance Use: Not Applicable Psych Involvement: No (comment)  Admission diagnosis:  NSTEMI (non-ST elevated myocardial infarction) [facility] [I21.4] Patient Active Problem List   Diagnosis Date Noted   NSTEMI (non-ST elevated myocardial infarction) (HCC) 06/09/2023   Iron deficiency anemia 08/22/2022   UTI (urinary tract infection) 09/24/2021   Mixed hyperlipidemia 09/23/2021   Acute renal failure superimposed on stage 4 chronic kidney disease (HCC) 09/22/2021   Prediabetes 08/29/2020   Fatigue 08/26/2020   Protein-calorie malnutrition (HCC) 04/19/2020   GERD (gastroesophageal reflux disease) 08/12/2019   Bilateral primary osteoarthritis of knee 11/09/2016   History of stroke 06/02/2016   Osteoarthritis 03/25/2014   Anxiety and depression 02/20/2012   Chronic headache 02/20/2012   Peripheral edema 02/19/2012  Hyperparathyroidism (HCC) 10/23/2008   Essential hypertension 10/23/2008   GALLSTONE PANCREATITIS 10/23/2008   Chronic kidney disease 10/23/2008   DEGENERATIVE JOINT DISEASE 10/23/2008   PROTEINURIA 10/23/2008   PCP:  Thana Ates, MD Pharmacy:   [facility] [phone] - [place], [place] - [address] AT [facility] OF [address] & [address] [place] [address] Phone:  [phone] Fax: [phone]     Social Drivers of Health (SDOH) Social History: SDOH Screenings   Food Insecurity: No Food Insecurity (06/09/2023)  Housing: Low Risk  (06/09/2023)  Transportation Needs: No Transportation Needs (06/09/2023)  Utilities: Not At Risk (06/09/2023)  Alcohol Screen: Low Risk  (01/12/2020)  Depression (PHQ2-9): Low Risk  (08/26/2020)  Social Connections: Unknown (06/09/2023)  Tobacco Use: Low Risk  (05/14/2023)   SDOH Interventions:     Readmission Risk Interventions    06/11/2023    4:22 PM  Readmission Risk Prevention Plan  Transportation Screening Complete  PCP or Specialist Appt within 5-7 Days Complete  Medication Review (RN CM) Complete

## 2023-06-11 NOTE — Congregational Nurse Program (Unsigned)
 [name] daughter contacted me and requested my assistance at their home, expressing concern that her mother was experiencing significant difficulty breathing. Upon arriving, I observed that [name] was breathing rapidly and shallowly, indicating distress. She also complained of a congested feeling in her chest, which seemed to exacerbate her anxiety.  Despite her alarming condition, [name] was alert and aware of her surroundings. However, her pulse was notably thready and weak, and her skin appeared pale with a dusky undertone, signaling potential respiratory distress. Given the severity of her symptoms, I decided it was imperative to call 911 for immediate medical assistance.  When the paramedics arrived, they conducted a thorough assessment of her condition. After evaluating her vital signs and overall state, they determined that she required urgent care and proceeded to transport her to the [facility] Emergency Department for further evaluation and treatment.  Salem Senate RN MSN DNP [facility] Nurse

## 2023-06-11 NOTE — Progress Notes (Signed)
 Progress Note   Patient: [name] OZD:[id number] DOB: Nov 20, 1930 DOA: 06/09/2023     2 DOS: the patient was seen and examined on 06/11/2023   Brief hospital course: [name]  is a 89 y.o. female,with history of anxiety, CKD stage IV, hypertension, hyperlipidemia, stroke presented for evaluation of chest pain.  ED workup showed elevated troponin, elevated serum creatinine.  She is started on heparin drip per protocol.  Patient is admitted to the hospitalist service for further cardiac workup. Echo showed 6.1 cm ascending aneurysm, severe AI, moderate to severe TR.  Assessment and Plan: NSTEMI Cardiology follow up appreciated. Continue medical management, advised palliative consultation. 2D Echo showed 6.1 cm ascending aneurysm, severe AI, moderate to severe TR.  Continue with heparin drip per pharmacy protocol. Given her kidney dysfunction unable to get CTA chest nor cath. Continue Aggrenox, started high dose statin therapy.   NTG as needed for chest pain. Continue Carvedilol 12.5 twice daily.  Ascending Aortic Aneurysm: CT surgery evaluation appreciated. She is not a candidate for surgery for her AAA. Discussed with patient and family at bedside.  CKD stage IV Continue to monitor daily renal function. Avoid nephrotoxic drugs. No contrast study or cath planned at this time.   Hypertensive urgency Metoprolol changed to carvedilol therapy. Started Norvasc 5 mg which can be increased to 10 if needed. IV as needed hydralazine ordered   Mixed hyperlipidemia Continue high-dose statin   GERD (gastroesophageal reflux disease) Continue PPI   History of stroke Will resume home Aggrenox therapy.    Overall prognosis poor. Out of bed to chair. Incentive spirometry. Nursing supportive care. Fall, aspiration precautions. Diet:  Diet Orders (From admission, onward)     Start     Ordered   06/09/23 1726  Diet Heart Room service appropriate? Yes; Fluid consistency:  Thin  Diet effective now       Question Answer Comment  Room service appropriate? Yes   Fluid consistency: Thin      06/09/23 1725           DVT prophylaxis: Heparin drip   Code Status: Full Code  Subjective: Patient is seen and examined today morning. She is weak, discussed with her the need for discussion about goals of care. One of her grand children at bedside. She agreed for Palliative consult.  Physical Exam: Vitals:   06/11/23 0438 06/11/23 0750 06/11/23 1214 06/11/23 1628  BP: (!) 144/85 (!) 155/94 112/63 110/74  Pulse: 80 80 73 74  Resp: 20 (!) 24 (!) 24 (!) 24  Temp: 98.9 F (37.2 C) 98.2 F (36.8 C) 98.1 F (36.7 C) 98.1 F (36.7 C)  TempSrc: Oral Oral Oral Oral  SpO2: 100% 100% 99% 100%  Weight:      Height:        General - Elderly Caucasian female, mild respiratory distress HEENT - PERRLA, EOMI, atraumatic head, non tender sinuses. Lung - Clear, basal rales, no rhonchi, wheezes. Heart - S1, S2 heard, no murmurs, rubs, trace pedal edema. Abdomen - Soft, non tender, bowel sounds good Neuro - Alert, awake and oriented x 3, non focal exam. Skin - Warm and dry.  Data Reviewed:      Latest Ref Rng & Units 06/11/2023    2:45 AM 06/10/2023    8:26 AM 06/10/2023    2:16 AM  CBC  WBC 4.0 - 10.5 K/uL 10.0  9.4  8.5   Hemoglobin 12.0 - 15.0 g/dL 8.9  9.6  9.3   Hematocrit 36.0 -  46.0 % 28.7  31.1  30.6   Platelets 150 - 400 K/uL 198  209  210       Latest Ref Rng & Units 06/11/2023    2:45 AM 06/10/2023    8:26 AM 06/10/2023    2:16 AM  BMP  Glucose 70 - 99 mg/dL 562  130  865   BUN 8 - 23 mg/dL 45  38  36   Creatinine 0.44 - 1.00 mg/dL 7.84  6.96  2.95   Sodium 135 - 145 mmol/L 142  144  144   Potassium 3.5 - 5.1 mmol/L 5.3  5.4  6.1   Chloride 98 - 111 mmol/L 113  113  115   CO2 22 - 32 mmol/L 21  18  17    Calcium 8.9 - 10.3 mg/dL 8.1  8.6  8.5    ECHOCARDIOGRAM COMPLETE Result Date: 06/10/2023    ECHOCARDIOGRAM REPORT   Patient Name:   [name] Date of Exam: 06/10/2023 Medical Rec #:  [id number]         Height:       61.0 in Accession #:    [id number]        Weight:       138.7 lb Date of Birth:  04-23-1930          BSA:          1.617 m Patient Age:    89 years          BP:           145/94 mmHg Patient Gender: F                 HR:           85 bpm. Exam Location:  Inpatient Procedure: 2D Echo, Cardiac Doppler and Color Doppler (Both Spectral and Color            Flow Doppler were utilized during procedure). Indications:    NSTEMI I21.4  History:        Patient has no prior history of Echocardiogram examinations.                 Previous Myocardial Infarction, CKD and Stroke; Risk                 Factors:Hypertension and Dyslipidemia.  Sonographer:    Lucendia Herrlich RCS Referring Phys: 63 DAWOOD S ELGER[place]WY IMPRESSIONS  1. Aneurysm of the ascending aorta, measuring 61 mm.  2. Left ventricular ejection fraction, by estimation, is 50 to 55%. The left ventricle has low normal function. The left ventricle demonstrates global hypokinesis. Left ventricular diastolic parameters are indeterminate.  3. Right ventricular systolic function is normal. The right ventricular size is normal. There is moderately elevated pulmonary artery systolic pressure.  4. Left atrial size was mildly dilated.  5. The mitral valve is normal in structure. Mild to moderate mitral valve regurgitation. No evidence of mitral stenosis.  6. Tricuspid valve regurgitation is moderate to severe.  7. The aortic valve is normal in structure. Aortic valve regurgitation is severe. No aortic stenosis is present.  8. The inferior vena cava is dilated in size with <50% respiratory variability, suggesting right atrial pressure of 15 mmHg. FINDINGS  Left Ventricle: Left ventricular ejection fraction, by estimation, is 50 to 55%. The left ventricle has low normal function. The left ventricle demonstrates global hypokinesis. The left ventricular internal cavity size was normal in size. There  is no left ventricular hypertrophy.  Left ventricular diastolic parameters are indeterminate. Right Ventricle: The right ventricular size is normal. No increase in right ventricular wall thickness. Right ventricular systolic function is normal. There is moderately elevated pulmonary artery systolic pressure. The tricuspid regurgitant velocity is 3.26 m/s, and with an assumed right atrial pressure of 15 mmHg, the estimated right ventricular systolic pressure is 57.5 mmHg. Left Atrium: Left atrial size was mildly dilated. Right Atrium: Right atrial size was normal in size. Pericardium: There is no evidence of pericardial effusion. Mitral Valve: The mitral valve is normal in structure. Mild to moderate mitral valve regurgitation. No evidence of mitral valve stenosis. Tricuspid Valve: The tricuspid valve is normal in structure. Tricuspid valve regurgitation is moderate to severe. No evidence of tricuspid stenosis. Aortic Valve: The aortic valve is normal in structure. Aortic valve regurgitation is severe. Aortic regurgitation PHT measures 199 msec. No aortic stenosis is present. Aortic valve peak gradient measures 8.0 mmHg. Pulmonic Valve: The pulmonic valve was normal in structure. Pulmonic valve regurgitation is mild to moderate. No evidence of pulmonic stenosis. Aorta: There is an aneurysm involving the ascending aorta measuring 61 mm. Venous: The inferior vena cava is dilated in size with less than 50% respiratory variability, suggesting right atrial pressure of 15 mmHg. IAS/Shunts: No atrial level shunt detected by color flow Doppler.  LEFT VENTRICLE PLAX 2D LVIDd:         4.80 cm   Diastology LVIDs:         3.50 cm   LV e' medial:    3.68 cm/s LV PW:         0.90 cm   LV E/e' medial:  20.1 LV IVS:        0.60 cm   LV e' lateral:   6.22 cm/s LVOT diam:     1.90 cm   LV E/e' lateral: 11.9 LV SV:         45 LV SV Index:   28 LVOT Area:     2.84 cm                           3D Volume EF:                          3D  EF:        52 %                          LV EDV:       147 ml                          LV ESV:       71 ml                          LV SV:        76 ml RIGHT VENTRICLE            IVC RV S prime:     9.20 cm/s  IVC diam: 2.70 cm TAPSE (M-mode): 1.6 cm LEFT ATRIUM             Index        RIGHT ATRIUM           Index LA diam:        3.80 cm 2.35 cm/m   RA Area:  12.30 cm LA Vol (A2C):   47.4 ml 29.32 ml/m  RA Volume:   26.80 ml  16.58 ml/m LA Vol (A4C):   76.6 ml 47.39 ml/m LA Biplane Vol: 64.2 ml 39.72 ml/m  AORTIC VALVE AV Area (Vmax): 1.75 cm AV Vmax:        141.50 cm/s AV Peak Grad:   8.0 mmHg LVOT Vmax:      87.17 cm/s LVOT Vmean:     55.900 cm/s LVOT VTI:       0.158 m AI PHT:         199 msec  AORTA Ao Root diam: 3.40 cm Ao Asc diam:  6.10 cm MITRAL VALVE                TRICUSPID VALVE MV Area (PHT): 5.27 cm     TR Peak grad:   42.5 mmHg MV Decel Time: 144 msec     TR Vmax:        326.00 cm/s MR Peak grad: 93.7 mmHg MR Vmax:      484.00 cm/s   SHUNTS MV E velocity: 73.90 cm/s   Systemic VTI:  0.16 m MV A velocity: 107.00 cm/s  Systemic Diam: 1.90 cm MV E/A ratio:  0.69 Kardie Tobb DO Electronically signed by Thomasene Ripple DO Signature Date/Time: 06/10/2023/5:12:56 PM    Final     Family Communication: Discussed with patient, family at bedside. They understand and agree. All questions answered.  Disposition: Status is: Inpatient Remains inpatient appropriate because: palliative care, medical management, Planned Discharge Destination: Home with Home Health     MDM level 3- Patient admitted with chest pain, trending up troponin. She will need close hemodynamic, telemetry monitoring.  Patient is on IV heparin drip per protocol.  Patient is at high risk for sudden clinical deterioration. Further management per palliative care GOC discussions.  Author: Marcelino Duster, MD 06/11/2023 6:41 PM Secure chat 7am to 7pm For on call review [email or web address].

## 2023-06-12 ENCOUNTER — Other Ambulatory Visit: Payer: Self-pay

## 2023-06-12 DIAGNOSIS — Z66 Do not resuscitate: Secondary | ICD-10-CM

## 2023-06-12 DIAGNOSIS — E782 Mixed hyperlipidemia: Secondary | ICD-10-CM | POA: Diagnosis not present

## 2023-06-12 DIAGNOSIS — I10 Essential (primary) hypertension: Secondary | ICD-10-CM | POA: Diagnosis not present

## 2023-06-12 DIAGNOSIS — Z515 Encounter for palliative care: Secondary | ICD-10-CM

## 2023-06-12 DIAGNOSIS — I7121 Aneurysm of the ascending aorta, without rupture: Secondary | ICD-10-CM

## 2023-06-12 DIAGNOSIS — N184 Chronic kidney disease, stage 4 (severe): Secondary | ICD-10-CM | POA: Diagnosis not present

## 2023-06-12 DIAGNOSIS — I214 Non-ST elevation (NSTEMI) myocardial infarction: Secondary | ICD-10-CM | POA: Diagnosis not present

## 2023-06-12 DIAGNOSIS — I071 Rheumatic tricuspid insufficiency: Secondary | ICD-10-CM

## 2023-06-12 LAB — CBC
HCT: 27.6 % — ABNORMAL LOW (ref 36.0–46.0)
Hemoglobin: 8.5 g/dL — ABNORMAL LOW (ref 12.0–15.0)
MCH: 31.8 pg (ref 26.0–34.0)
MCHC: 30.8 g/dL (ref 30.0–36.0)
MCV: 103.4 fL — ABNORMAL HIGH (ref 80.0–100.0)
Platelets: 128 10*3/uL — ABNORMAL LOW (ref 150–400)
RBC: 2.67 MIL/uL — ABNORMAL LOW (ref 3.87–5.11)
RDW: 14.6 % (ref 11.5–15.5)
WBC: 10 10*3/uL (ref 4.0–10.5)
nRBC: 0 % (ref 0.0–0.2)

## 2023-06-12 LAB — BASIC METABOLIC PANEL
Anion gap: 9 (ref 5–15)
BUN: 56 mg/dL — ABNORMAL HIGH (ref 8–23)
CO2: 19 mmol/L — ABNORMAL LOW (ref 22–32)
Calcium: 7.8 mg/dL — ABNORMAL LOW (ref 8.9–10.3)
Chloride: 111 mmol/L (ref 98–111)
Creatinine, Ser: 3.42 mg/dL — ABNORMAL HIGH (ref 0.44–1.00)
GFR, Estimated: 12 mL/min — ABNORMAL LOW (ref >60)
Glucose, Bld: 101 mg/dL — ABNORMAL HIGH (ref 70–99)
Potassium: 5.1 mmol/L (ref 3.5–5.1)
Sodium: 139 mmol/L (ref 135–145)

## 2023-06-12 MED ORDER — HYDROMORPHONE HCL 1 MG/ML PO LIQD: 0.5000 mg | ORAL | Status: DC | PRN

## 2023-06-12 MED ORDER — METOPROLOL SUCCINATE ER 25 MG PO TB24
12.5000 mg | ORAL_TABLET | Freq: Every day | ORAL | Status: DC
  Administered 2023-06-13: 12.5 mg via ORAL
  Filled 2023-06-12: qty 1

## 2023-06-12 MED ORDER — OXYCODONE HCL 5 MG PO TABS: 5.0000 mg | ORAL_TABLET | ORAL | Status: DC | PRN

## 2023-06-12 NOTE — Plan of Care (Signed)
  Problem: Clinical Measurements: Goal: Ability to maintain clinical measurements within normal limits will improve Outcome: Progressing   Problem: Nutrition: Goal: Adequate nutrition will be maintained Outcome: Progressing   

## 2023-06-12 NOTE — Plan of Care (Signed)
  Problem: Education: Goal: Understanding of cardiac disease, CV risk reduction, and recovery process will improve Outcome: Progressing   Problem: Education: Goal: Knowledge of General Education information will improve Description: Including pain rating scale, medication(s)/side effects and non-pharmacologic comfort measures Outcome: Progressing   Problem: Coping: Goal: Level of anxiety will decrease Outcome: Progressing

## 2023-06-12 NOTE — TOC Progression Note (Addendum)
 Transition of Care [facility]) - Progression Note    Patient Details  Name: [name] MRN: [id number] Date of Birth: [birth year]  Transition of Care [facility]) CM/SW Contact  Leone Haven, RN Phone Number: 06/12/2023, 2:06 PM  Clinical Narrative:    NCM received notification that patient will go home with hospice, NCM has been given permission to speak while family is in the room,  Family states they would like [facility] from list of agencies on Medicare. Gov list,,  they will need a hospital bed and bedside table.  NCM asked Shawn with [facility] to contact Dois Davenport , her daughter at [phone]. [facility] will supply the DME for the Family.    Expected Discharge Plan: Home w Home Health Services Barriers to Discharge: Continued Medical Work up  Expected Discharge Plan and Services   Discharge Planning Services: CM Consult Post Acute Care Choice: NA Living arrangements for the past 2 months: Single Family Home                 DME Arranged: N/A DME Agency: NA       HH Arranged: NA           Social Determinants of Health (SDOH) Interventions SDOH Screenings   Food Insecurity: No Food Insecurity (06/09/2023)  Housing: Low Risk  (06/09/2023)  Transportation Needs: No Transportation Needs (06/09/2023)  Utilities: Not At Risk (06/09/2023)  Alcohol Screen: Low Risk  (01/12/2020)  Depression (PHQ2-9): Low Risk  (08/26/2020)  Social Connections: Moderately Isolated (06/12/2023)  Tobacco Use: Low Risk  (05/14/2023)    Readmission Risk Interventions    06/11/2023    4:22 PM  Readmission Risk Prevention Plan  Transportation Screening Complete  PCP or Specialist Appt within 5-7 Days Complete  Medication Review (RN CM) Complete

## 2023-06-12 NOTE — Progress Notes (Signed)
 Progress Note   Patient: [name] ZDG:[id number] DOB: 02/11/1931 DOA: 06/09/2023     3 DOS: the patient was seen and examined on 06/12/2023   Brief hospital course: [name]  is a 89 y.o. female,with history of anxiety, CKD stage IV, hypertension, hyperlipidemia, stroke presented for evaluation of chest pain.  ED workup showed elevated troponin, elevated serum creatinine.  She is started on heparin drip per protocol.  Patient is admitted to the hospitalist service for further cardiac workup. Echo showed 6.1 cm ascending aneurysm, severe AI, moderate to severe TR.  Assessment and Plan: NSTEMI: Ascending Aortic Aneurysm: CKD stage 4 Hypertensive urgency Cardiology follow up appreciated. Heparin discontinued.  Advised palliative consultation. 2D Echo showed 6.1 cm ascending aneurysm, severe AI, moderate to severe TR.  Given her kidney dysfunction unable to get CTA chest nor Cardiac cath. CT surgery evaluation appreciated. She is not a candidate for surgery for her AAA. NTG as needed for chest pain. Discussed with patient and family at bedside.  Palliative discussion today - patient and family opted for DNR, comfort care only. She wishes to go home with hospice. TOC to work on services and plan to [employer].    Overall prognosis poor. Family understand. Diet:  Diet Orders (From admission, onward)     Start     Ordered   06/09/23 1726  Diet Heart Room service appropriate? Yes; Fluid consistency: Thin  Diet effective now       Question Answer Comment  Room service appropriate? Yes   Fluid consistency: Thin      06/09/23 1725             Code Status: Do not attempt resuscitation (DNR) - Comfort care  Subjective: Patient is seen and examined today morning. She is weak, able to answer me slowly. Also has shortness of breath with talking. Did discuss with son who understand poor prognosis. Palliative team to discuss goals of care around noon.  Physical  Exam: Vitals:   06/12/23 0558 06/12/23 0559 06/12/23 0831 06/12/23 1155  BP:   121/76 (!) 97/51  Pulse: 77 76 80 77  Resp: (!) 23 (!) 24 (!) 21 (!) 23  Temp:   98.8 F (37.1 C) 97.7 F (36.5 C)  TempSrc:   Oral Oral  SpO2: 100% 100% 100% 90%  Weight:      Height:        General - Elderly Caucasian female, respiratory distress with mild exertion. HEENT - PERRLA, EOMI, atraumatic head, non tender sinuses. Lung - Clear, basal rales, no rhonchi, wheezes. Heart - S1, S2 heard, no murmurs, rubs, trace pedal edema. Abdomen - Soft, non tender, bowel sounds good Neuro - Alert, awake and oriented x 3, non focal exam. Skin - Warm and dry.  Data Reviewed:      Latest Ref Rng & Units 06/12/2023    2:51 AM 06/11/2023    2:45 AM 06/10/2023    8:26 AM  CBC  WBC 4.0 - 10.5 K/uL 10.0  10.0  9.4   Hemoglobin 12.0 - 15.0 g/dL 8.5  8.9  9.6   Hematocrit 36.0 - 46.0 % 27.6  28.7  31.1   Platelets 150 - 400 K/uL 128  198  209       Latest Ref Rng & Units 06/12/2023    2:51 AM 06/11/2023    2:45 AM 06/10/2023    8:26 AM  BMP  Glucose 70 - 99 mg/dL 951  884  166   BUN  8 - 23 mg/dL 56  45  38   Creatinine 0.44 - 1.00 mg/dL 9.14  7.82  9.56   Sodium 135 - 145 mmol/L 139  142  144   Potassium 3.5 - 5.1 mmol/L 5.1  5.3  5.4   Chloride 98 - 111 mmol/L 111  113  113   CO2 22 - 32 mmol/L 19  21  18    Calcium 8.9 - 10.3 mg/dL 7.8  8.1  8.6    ECHOCARDIOGRAM COMPLETE Result Date: 06/10/2023    ECHOCARDIOGRAM REPORT   Patient Name:   [name] Date of Exam: 06/10/2023 Medical Rec #:  [id number]         Height:       61.0 in Accession #:    [id number]        Weight:       138.7 lb Date of Birth:  August 25, 1930          BSA:          1.617 m Patient Age:    89 years          BP:           145/94 mmHg Patient Gender: F                 HR:           85 bpm. Exam Location:  Inpatient Procedure: 2D Echo, Cardiac Doppler and Color Doppler (Both Spectral and Color            Flow Doppler were utilized during  procedure). Indications:    NSTEMI I21.4  History:        Patient has no prior history of Echocardiogram examinations.                 Previous Myocardial Infarction, CKD and Stroke; Risk                 Factors:Hypertension and Dyslipidemia.  Sonographer:    Lucendia Herrlich RCS Referring Phys: 67 DAWOOD S ELGER[place]WY IMPRESSIONS  1. Aneurysm of the ascending aorta, measuring 61 mm.  2. Left ventricular ejection fraction, by estimation, is 50 to 55%. The left ventricle has low normal function. The left ventricle demonstrates global hypokinesis. Left ventricular diastolic parameters are indeterminate.  3. Right ventricular systolic function is normal. The right ventricular size is normal. There is moderately elevated pulmonary artery systolic pressure.  4. Left atrial size was mildly dilated.  5. The mitral valve is normal in structure. Mild to moderate mitral valve regurgitation. No evidence of mitral stenosis.  6. Tricuspid valve regurgitation is moderate to severe.  7. The aortic valve is normal in structure. Aortic valve regurgitation is severe. No aortic stenosis is present.  8. The inferior vena cava is dilated in size with <50% respiratory variability, suggesting right atrial pressure of 15 mmHg. FINDINGS  Left Ventricle: Left ventricular ejection fraction, by estimation, is 50 to 55%. The left ventricle has low normal function. The left ventricle demonstrates global hypokinesis. The left ventricular internal cavity size was normal in size. There is no left ventricular hypertrophy. Left ventricular diastolic parameters are indeterminate. Right Ventricle: The right ventricular size is normal. No increase in right ventricular wall thickness. Right ventricular systolic function is normal. There is moderately elevated pulmonary artery systolic pressure. The tricuspid regurgitant velocity is 3.26 m/s, and with an assumed right atrial pressure of 15 mmHg, the estimated right ventricular systolic pressure is 57.5  mmHg. Left Atrium: Left  atrial size was mildly dilated. Right Atrium: Right atrial size was normal in size. Pericardium: There is no evidence of pericardial effusion. Mitral Valve: The mitral valve is normal in structure. Mild to moderate mitral valve regurgitation. No evidence of mitral valve stenosis. Tricuspid Valve: The tricuspid valve is normal in structure. Tricuspid valve regurgitation is moderate to severe. No evidence of tricuspid stenosis. Aortic Valve: The aortic valve is normal in structure. Aortic valve regurgitation is severe. Aortic regurgitation PHT measures 199 msec. No aortic stenosis is present. Aortic valve peak gradient measures 8.0 mmHg. Pulmonic Valve: The pulmonic valve was normal in structure. Pulmonic valve regurgitation is mild to moderate. No evidence of pulmonic stenosis. Aorta: There is an aneurysm involving the ascending aorta measuring 61 mm. Venous: The inferior vena cava is dilated in size with less than 50% respiratory variability, suggesting right atrial pressure of 15 mmHg. IAS/Shunts: No atrial level shunt detected by color flow Doppler.  LEFT VENTRICLE PLAX 2D LVIDd:         4.80 cm   Diastology LVIDs:         3.50 cm   LV e' medial:    3.68 cm/s LV PW:         0.90 cm   LV E/e' medial:  20.1 LV IVS:        0.60 cm   LV e' lateral:   6.22 cm/s LVOT diam:     1.90 cm   LV E/e' lateral: 11.9 LV SV:         45 LV SV Index:   28 LVOT Area:     2.84 cm                           3D Volume EF:                          3D EF:        52 %                          LV EDV:       147 ml                          LV ESV:       71 ml                          LV SV:        76 ml RIGHT VENTRICLE            IVC RV S prime:     9.20 cm/s  IVC diam: 2.70 cm TAPSE (M-mode): 1.6 cm LEFT ATRIUM             Index        RIGHT ATRIUM           Index LA diam:        3.80 cm 2.35 cm/m   RA Area:     12.30 cm LA Vol (A2C):   47.4 ml 29.32 ml/m  RA Volume:   26.80 ml  16.58 ml/m LA Vol (A4C):   76.6  ml 47.39 ml/m LA Biplane Vol: 64.2 ml 39.72 ml/m  AORTIC VALVE AV Area (Vmax): 1.75 cm AV Vmax:        141.50 cm/s AV Peak Grad:  8.0 mmHg LVOT Vmax:      87.17 cm/s LVOT Vmean:     55.900 cm/s LVOT VTI:       0.158 m AI PHT:         199 msec  AORTA Ao Root diam: 3.40 cm Ao Asc diam:  6.10 cm MITRAL VALVE                TRICUSPID VALVE MV Area (PHT): 5.27 cm     TR Peak grad:   42.5 mmHg MV Decel Time: 144 msec     TR Vmax:        326.00 cm/s MR Peak grad: 93.7 mmHg MR Vmax:      484.00 cm/s   SHUNTS MV E velocity: 73.90 cm/s   Systemic VTI:  0.16 m MV A velocity: 107.00 cm/s  Systemic Diam: 1.90 cm MV E/A ratio:  0.69 Kardie Tobb DO Electronically signed by Thomasene Ripple DO Signature Date/Time: 06/10/2023/5:12:56 PM    Final     Family Communication: Discussed with patient, son at bedside. They understand and agree. All questions answered.  Disposition: Status is: Inpatient Remains inpatient appropriate because: comfort care only Planned Discharge Destination: Home with Home Health Hospice     Time spent 40 min.   Author: Marcelino Duster, MD 06/12/2023 1:17 PM Secure chat 7am to 7pm For on call review [email or web address].

## 2023-06-12 NOTE — Progress Notes (Addendum)
 Patient Name: [name] Date of Encounter: 06/12/2023 [facility] Cardiologist: Rollene Rotunda, MD   Interval Summary  .    Patient resting comfortably in the bed Currently on 2 L oxygen via [place] Denies any current chest pain or shortness of breath, says she feels good Awaiting to be seen by palliative medicine for goals of care discussion   Vital Signs .    Vitals:   06/12/23 0557 06/12/23 0558 06/12/23 0559 06/12/23 0831  BP:    121/76  Pulse: 76 77 76 80  Resp: 18 (!) 23 (!) 24 (!) 21  Temp:    98.8 F (37.1 C)  TempSrc:    Oral  SpO2: 100% 100% 100% 100%  Weight:      Height:       Intake/Output Summary (Last 24 hours) at 06/12/2023 1021 Last data filed at 06/11/2023 2000 Gross per 24 hour  Intake 480 ml  Output 120 ml  Net 360 ml      06/12/2023    5:55 AM 06/11/2023    4:00 AM 06/10/2023    5:21 AM  Last 3 Weights  Weight (lbs) 148 lb 5.9 oz 142 lb 10.2 oz 138 lb 10.7 oz  Weight (kg) 67.3 kg 64.7 kg 62.9 kg      Telemetry/ECG    Sinus rhythm, HR 70s  - Personally Reviewed  Physical Exam .   GEN: No acute distress, on 2 L oxygen via [place].   Neck: No JVD Cardiac: RRR, no murmurs, rubs, or gallops.  Respiratory: Clear to auscultation anteriorly. GI: Soft, nontender, non-distended  MS: No edema  Assessment & Plan .   [name] is a 89yo female with history of thoracic aortic aneurysm, prior CVA, HTN, HLD, CKD stage 4, GERD, OA and major depressive disorder who was being seen for chest pain.   NSTEMI Coronary calcifications noted on CT 07/2021 Mild to moderate MR, moderate to severe TR, severe AR Patient presented on 3/15 with chest pain Troponin 1,233 peaked at 5,940 now trending down No ST elevation noted on EKGs, T wave inversion noted in anterior leads  Echo 06/10/2023 showed: EF 50-55%, global hypokinesis, normal RV function, moderately elevated PASP, mild dilation of LA, severe AR, mild to moderate MR, moderate to severe TR,  dilated IVC Finished IV heparin x 48 hours, denies any complications   With multiple co-morbidities, CKD stage 4, and advanced age -- reasonable to continue treating her NSTEMI medically  Consider getting palliative care just for goals of care discussion as she remains full code at this time, consult placed per primary Continue on home Aggrenox 200-25 mg BID (from prior stroke) Continue Lipitor 80 mg daily    Hypertension  Hypertensive urgency  Presented with BP 199/145 Most recent BP 121/76 Continue amlodipine 5 mg daily  Continue CoReg 12.5 mg BID   Hyperlipidemia  06/10/2023: HDL 55; LDL Cholesterol 52  Continue Lipitor 80 mg daily    AAA  Measured 6.1 cm on echo -- previously noted to be 5.5 cm in 2023  Was previously seen in 2023 and determined to not be a surgical candidate  Seen by CT surgery Determined to not be a candidate for elective or emergent surgery given her advanced age and co-morbidities Continue with BP control    Per primary GERD CKD stage 4 History of stroke, 2018 -- on Aggrenox    For questions or updates, please contact [facility] Please consult [email or web address] for contact info under  Signed, Olena Leatherwood, PA-C    ATTENDING ATTESTATION:  After conducting a review of all available clinical information with the care team, interviewing the patient, and performing a physical exam, I agree with the findings and plan described in this note.   GEN: Increased WOB, frail HEENT:  Dry MM, no JVD, no scleral icterus Cardiac: RRR, no murmurs, rubs, or gallops.  Respiratory: Clear to auscultation bilaterally. GI: Soft, nontender, non-distended  MS: No edema; No deformity. Neuro:  Nonfocal  Vasc:  +1 radial pulses  Patient much more weak today with increased WOB and diffuse end exp wheezes.  No JVP or rales.  Continue medical therapy and consider therapy for wheezing.  No further cardiology work up required.  Cont Aggrenox, Lipitor 80,  amlodipine 5, and will change to Toprol XL 12.5mg  QPM given wheezing.  Alverda Skeans, MD Pager [phone]

## 2023-06-12 NOTE — Consult Note (Signed)
 Consultation Note Date: 06/12/2023   Patient Name: [name]  DOB: 1930-12-26  MRN: [id number]  Age / Sex: 89 y.o., female  PCP: Thana Ates, MD Referring Physician: Marcelino Duster, MD  Reason for Consultation: Establishing goals of care and Psychosocial/spiritual support  HPI/Patient Profile: 89 y.o. female   admitted on 06/09/2023 with   Past medical history of thoracic aorta aneurysm, prior cerebrovascular accident, hypertension, stage IV chronic kidney disease, gastroesophageal reflux disease, osteoarthritis, and major depressive disorder, chronic kidney disease stage IV admitted thru  the emergency room 06/10/2023 for the evaluation of chest pain   Patient admitted for further workup.  Echo showed 6 point once centimeters ascending aneurysm, severe AI and moderate to severe TR  Per cardiology patient is not a candidate for surgical intervention.  Family face treatment option decisions, advanced directive decisions and anticipatory care needs.   Clinical Assessment and Goals of Care:   This NP Lorinda Creed reviewed medical records, received report from team, assessed the patient and then meet at the patient's bedside along with her large family to include 2 daughters, 2 sons, a brother, son-in-laws  to discuss diagnosis, prognosis, GOC, EOL wishes disposition and options.    Concept of Palliative Care was introduced as specialized medical care for people and their families living with serious illness.  If focuses on providing relief from the symptoms and stress of a serious illness.  The goal is to improve quality of life for both the patient and the family.Values and goals of care important to patient and family were attempted to be elicited.    Created space and opportunity for patient  and family to explore thoughts and feelings regarding current medical situation.  Patient is  lethargic, minimal participation in today's conversation.  Large family verbalize an understanding of the current medical situation and the associated poor prognosis.  All  agree that comfort and dignity are the focus of care.  Hope is for discharge home asap with hospice services in place.   A  discussion was had today regarding advanced directives.  Concepts specific to code status, artifical feeding and hydration, continued IV antibiotics and rehospitalization was had.    The difference between a aggressive medical intervention path  and a palliative comfort care path for this patient at this time was had.    Education offered on hospice benefit; philosophy and eligibility  MOST form completed to reflect full comfort.  DNR/DNI documented    Natural trajectory and expectations at EOL were discussed.    Questions and concerns addressed.  Family encouraged to call with questions or concerns.     PMT will continue to support holistically.    Patients's two daughters/ Jolyn Nap and Alfredo Batty  (  are HPOA according to family       SUMMARY OF RECOMMENDATIONS    Code Status/Advance Care Planning: DNR- discussed in detail and supported by all family in the room    Symptom Management:  Dyspnea/Pain: Roxicodone/liquid solution-5 mg po/sl every 2 hours as  needed for pain or dyspnea  Palliative Prophylaxis:  Aspiration, Bowel Regimen, Delirium Protocol, Frequent Pain Assessment, and Oral Care  Additional Recommendations (Limitations, Scope, Preferences): Full Comfort Care   Psycho-social/Spiritual:  Desire for further Chaplaincy support:no Additional Recommendations: Education on Hospice  Prognosis:  < 6 weeks  Discharge Planning: Home with Hospice      Primary Diagnoses: Present on Admission:  NSTEMI (non-ST elevated myocardial infarction) (HCC)  Chronic kidney disease  Essential hypertension  GERD (gastroesophageal reflux disease)  Mixed hyperlipidemia   I  have reviewed the medical record, interviewed the patient and family, and examined the patient. The following aspects are pertinent.  Past Medical History:  Diagnosis Date   Anxiety    Arthritis    CKD (chronic kidney disease)    Dr. Fausto Skillern   Depression    GERD (gastroesophageal reflux disease)    Hyperlipidemia    Hypertension    Osteoporosis    Stroke [facility])    UTI (lower urinary tract infection)    Social History   Socioeconomic History   Marital status: Widowed    Spouse name: Not on file   Number of children: Not on file   Years of education: Not on file   Highest education level: Not on file  Occupational History   Not on file  Tobacco Use   Smoking status: Never   Smokeless tobacco: Never  Vaping Use   Vaping status: Never Used  Substance and Sexual Activity   Alcohol use: No   Drug use: No   Sexual activity: Never  Other Topics Concern   Not on file  Social History Narrative   Not on file   Social Drivers of Health   Financial Resource Strain: Not on file  Food Insecurity: No Food Insecurity (06/09/2023)   Hunger Vital Sign    Worried About Running Out of Food in the Last Year: Never true    Ran Out of Food in the Last Year: Never true  Transportation Needs: No Transportation Needs (06/09/2023)   PRAPARE - Administrator, Civil Service (Medical): No    Lack of Transportation (Non-Medical): No  Physical Activity: Not on file  Stress: Not on file  Social Connections: Unknown (06/09/2023)   Social Connection and Isolation Panel [NHANES]    Frequency of Communication with Friends and Family: More than three times a week    Frequency of Social Gatherings with Friends and Family: More than three times a week    Attends Religious Services: Not on Marketing executive or Organizations: Not on file    Attends Banker Meetings: 1 to 4 times per year    Marital Status: Not on file   Family History  Problem Relation Age of  Onset   Cancer Mother    Cancer Father    Scheduled Meds:  amLODipine  5 mg Oral Daily   atorvastatin  80 mg Oral Daily   calcitRIOL  0.25 mcg Oral Daily   carvedilol  12.5 mg Oral BID WC   dipyridamole-aspirin  1 capsule Oral BID   pantoprazole  40 mg Oral Daily   sodium bicarbonate  650 mg Oral BID   Continuous Infusions: PRN Meds:.acetaminophen **OR** acetaminophen, acetaminophen, albuterol, hydrALAZINE, nitroGLYCERIN, ondansetron (ZOFRAN) IV Medications Prior to Admission:  Prior to Admission medications   Medication Sig Start Date End Date Taking? Authorizing Provider  acetaminophen (TYLENOL) 650 MG CR tablet Take 650 mg by mouth every 8 (eight) hours  as needed for pain.   Yes [provider]  amLODipine (NORVASC) 2.5 MG tablet Take 2.5 mg by mouth daily.   Yes [provider]  atorvastatin (LIPITOR) 20 MG tablet Take 1 tablet (20 mg total) by mouth daily. 04/08/21  Yes Cathlean Marseilles A, NP  calcitRIOL (ROCALTROL) 0.25 MCG capsule Take 0.25 mcg by mouth daily.   Yes [provider]  cetirizine (ZYRTEC) 10 MG tablet Take 10 mg by mouth daily. As needed   Yes [provider]  Cranberry-Vitamin C-Vitamin E (CRANBERRY PLUS VITAMIN C) 4200-20-3 MG-MG-UNIT CAPS Take by mouth.   Yes [provider]  dipyridamole-aspirin (AGGRENOX) 200-25 MG 12hr capsule Take 1 capsule by mouth 2 (two) times daily. 04/08/21  Yes Valentino Nose, NP  ferrous sulfate 325 (65 FE) MG EC tablet Take 325 mg by mouth daily with breakfast.   Yes [provider]  labetalol (NORMODYNE) 100 MG tablet Take 1 tablet (100 mg total) by mouth 2 (two) times daily. 04/29/21  Yes Valentino Nose, NP  omeprazole (PRILOSEC) 20 MG capsule TAKE 1 CAPSULE(20 MG) BY MOUTH DAILY 04/29/21  Yes Cathlean Marseilles A, NP  sodium bicarbonate 650 MG tablet Take 650 mg by mouth 2 (two) times daily.   Yes [provider]  traMADol (ULTRAM) 50 MG tablet Take 1 tablet (50 mg  total) by mouth daily as needed. 07/30/21  Yes Dione Booze, MD  Omega-3 Fatty Acids (OMEGA-3 FISH OIL PO) Take 1 capsule by mouth daily. Patient not taking: Reported on 06/09/2023    [provider]   Allergies  Allergen Reactions   Ciprofloxacin     Acute renal failure   Diphenhydramine Hcl     REACTION: UNKNOWN REACTION   Sulfonamide Derivatives Rash   Review of Systems  Unable to perform ROS: Acuity of condition    Physical Exam Constitutional:      Appearance: She is ill-appearing.     Interventions: Nasal cannula in place.  Cardiovascular:     Rate and Rhythm: Normal rate.  Pulmonary:     Effort: Tachypnea present.  Skin:    General: Skin is warm and dry.  Neurological:     Mental Status: She is lethargic.     Vital Signs: BP 121/76 (BP Location: Right Arm)   Pulse 80   Temp 98.8 F (37.1 C) (Oral)   Resp (!) 21   Ht 5\' 1"  (1.549 m)   Wt 67.3 kg   SpO2 100%   BMI 28.03 kg/m  Pain Scale: 0-10   Pain Score: 0-No pain   SpO2: SpO2: 100 % O2 Device:SpO2: 100 % O2 Flow Rate: .O2 Flow Rate (L/min): 4 L/min  IO: Intake/output summary:  Intake/Output Summary (Last 24 hours) at 06/12/2023 1145 Last data filed at 06/11/2023 2000 Gross per 24 hour  Intake 480 ml  Output 120 ml  Net 360 ml    LBM: Last BM Date : 06/12/23 Baseline Weight: Weight: 63.8 kg Most recent weight: Weight: 67.3 kg     Palliative Assessment/Data: 30%     Time;  75 minutes Signed by: Lorinda Creed, NP   Please contact Palliative Medicine Team phone at [phone] for questions and concerns.  For individual provider: See Loretha Stapler

## 2023-06-12 NOTE — Plan of Care (Signed)
  Problem: Education: Goal: Understanding of cardiac disease, CV risk reduction, and recovery process will improve Outcome: Progressing   Problem: Education: Goal: Knowledge of General Education information will improve Description: Including pain rating scale, medication(s)/side effects and non-pharmacologic comfort measures Outcome: Progressing

## 2023-06-13 ENCOUNTER — Other Ambulatory Visit (HOSPITAL_COMMUNITY): Payer: Self-pay

## 2023-06-13 DIAGNOSIS — I7121 Aneurysm of the ascending aorta, without rupture: Secondary | ICD-10-CM

## 2023-06-13 DIAGNOSIS — I5033 Acute on chronic diastolic (congestive) heart failure: Secondary | ICD-10-CM | POA: Diagnosis not present

## 2023-06-13 DIAGNOSIS — N184 Chronic kidney disease, stage 4 (severe): Secondary | ICD-10-CM | POA: Diagnosis not present

## 2023-06-13 DIAGNOSIS — I214 Non-ST elevation (NSTEMI) myocardial infarction: Secondary | ICD-10-CM | POA: Diagnosis not present

## 2023-06-13 LAB — CBC
HCT: 28.7 % — ABNORMAL LOW (ref 36.0–46.0)
Hemoglobin: 8.8 g/dL — ABNORMAL LOW (ref 12.0–15.0)
MCH: 31.7 pg (ref 26.0–34.0)
MCHC: 30.7 g/dL (ref 30.0–36.0)
MCV: 103.2 fL — ABNORMAL HIGH (ref 80.0–100.0)
Platelets: 152 10*3/uL (ref 150–400)
RBC: 2.78 MIL/uL — ABNORMAL LOW (ref 3.87–5.11)
RDW: 14.5 % (ref 11.5–15.5)
WBC: 9.9 10*3/uL (ref 4.0–10.5)
nRBC: 0 % (ref 0.0–0.2)

## 2023-06-13 LAB — BASIC METABOLIC PANEL
Anion gap: 8 (ref 5–15)
BUN: 62 mg/dL — ABNORMAL HIGH (ref 8–23)
CO2: 22 mmol/L (ref 22–32)
Calcium: 8 mg/dL — ABNORMAL LOW (ref 8.9–10.3)
Chloride: 110 mmol/L (ref 98–111)
Creatinine, Ser: 3.65 mg/dL — ABNORMAL HIGH (ref 0.44–1.00)
GFR, Estimated: 11 mL/min — ABNORMAL LOW (ref >60)
Glucose, Bld: 116 mg/dL — ABNORMAL HIGH (ref 70–99)
Potassium: 4.8 mmol/L (ref 3.5–5.1)
Sodium: 140 mmol/L (ref 135–145)

## 2023-06-13 MED ORDER — AMLODIPINE BESYLATE 5 MG PO TABS
5.0000 mg | ORAL_TABLET | Freq: Every day | ORAL | 0 refills | Status: AC
  Filled 2023-06-13: qty 30, 30d supply, fill #0

## 2023-06-13 MED ORDER — FUROSEMIDE 20 MG PO TABS
60.0000 mg | ORAL_TABLET | Freq: Every day | ORAL | 0 refills | Status: DC
  Filled 2023-06-13: qty 30, 10d supply, fill #0

## 2023-06-13 MED ORDER — FUROSEMIDE 40 MG PO TABS
60.0000 mg | ORAL_TABLET | Freq: Every day | ORAL | Status: DC
  Administered 2023-06-13: 60 mg via ORAL
  Filled 2023-06-13: qty 1

## 2023-06-13 MED ORDER — METOPROLOL SUCCINATE ER 25 MG PO TB24
12.5000 mg | ORAL_TABLET | Freq: Every day | ORAL | 0 refills | Status: AC
  Filled 2023-06-13: qty 15, 30d supply, fill #0

## 2023-06-13 NOTE — Assessment & Plan Note (Addendum)
 Echocardiogram with preserved LV systolic function with EF 50 to 55%, global hypokinesis, RV systolic function preserved, LA with moderate dilatation, mild to moderate mitral valve regurgitation, TR moderate to severe, severe aortic valve regurgitation,   Patient not candidate for advance therapies due to comorbid conditions.  Plan to continue blood pressure control and palliative diuresis with furosemide.  Labetalol changed to metoprolol succinate.  Follow up with hospice team as outpatient.

## 2023-06-13 NOTE — Assessment & Plan Note (Addendum)
 Continue omeprazole

## 2023-06-13 NOTE — Care Management Important Message (Signed)
 Important Message  Patient Details  Name: [name] MRN: [id number] Date of Birth: 1931-01-08   Important Message Given:  Yes - Medicare IM     Renie Ora 06/13/2023, 10:31 AM

## 2023-06-13 NOTE — Assessment & Plan Note (Addendum)
 61 mm in size, patient is not candidate for surgical intervention.

## 2023-06-13 NOTE — Progress Notes (Signed)
 Heart Failure Navigator Progress Note  Assessed for Heart & Vascular TOC clinic readiness.  Patient does not meet criteria due to ECHO 50-55%, Home with hospice , No HF TOC.   Navigator will sign off at this time.   Rhae Hammock, BSN, Scientist, clinical (histocompatibility and immunogenetics) Only

## 2023-06-13 NOTE — TOC Transition Note (Addendum)
 Transition of Care [facility]) - Discharge Note   Patient Details  Name: [name] MRN: [id number] Date of Birth: [birth year]  [facility]) CM/SW Contact:  Leone Haven, RN Phone Number: 06/13/2023, 11:59 AM   Clinical Narrative:    For dc today, per Melissa with [facility] the DME will be delivered to the home at 1 pm,  Patient states her son is here.Marland Kitchen  He was not in the room when this NCM went by.  The Nurse with [facility] will be coming by at 2 pm tomorrow.  NCM contacted daughter Dois Davenport she states patient will be going home by ambulance, NCM scheduled [facility] transport.      Barriers to Discharge: Continued Medical Work up   Patient Goals and CMS Choice Patient states their goals for this hospitalization and ongoing recovery are:: return home   Choice offered to / list presented to : NA      Discharge Placement                       Discharge Plan and Services Additional resources added to the After Visit Summary for     Discharge Planning Services: CM Consult Post Acute Care Choice: NA          DME Arranged: N/A DME Agency: NA       HH Arranged: NA          Social Drivers of Health (SDOH) Interventions SDOH Screenings   Food Insecurity: No Food Insecurity (06/09/2023)  Housing: Low Risk  (06/09/2023)  Transportation Needs: No Transportation Needs (06/09/2023)  Utilities: Not At Risk (06/09/2023)  Alcohol Screen: Low Risk  (01/12/2020)  Depression (PHQ2-9): Low Risk  (08/26/2020)  Social Connections: Moderately Isolated (06/12/2023)  Tobacco Use: Low Risk  (05/14/2023)     Readmission Risk Interventions    06/11/2023    4:22 PM  Readmission Risk Prevention Plan  Transportation Screening Complete  PCP or Specialist Appt within 5-7 Days Complete  Medication Review (RN CM) Complete

## 2023-06-13 NOTE — Hospital Course (Signed)
 [name] was admitted to the hospital with the working diagnosis of non ST elevation myocardial infarction.   89 yo female with the past medical history of CKD, hypertension, hyperlipidemia and CVA who presented with chest pain. Reported recurrent precordial chest pain, pressure in nature, occurring at rest. EMS was called and patient was transported to the ED. On her initial physical examination her blood pressure was 186/112, HR 95, RR 28 and 02 saturation 99%. Lungs with no wheezing or rhonchi, heart with S1 and S2 present and regular, abdomen with no distention and positive lower extremity edema.   Na 144, K 4.6 Cl 114 bicarbonate 19, glucose 175, bun 33 cr 2,28  High sensitive troponin 1,233- 1,985- 4,687  Wbc 9,1 hgb 9,4 plt 227  Sars covid 19 negative  Influenza negative   Chest radiograph with cardiomegaly, with bilateral hilar vascular congestion, bilateral central interstitial infiltrates and small bilateral pleural effusions.   EKG 93 bpm, normal axis, normal intervals, qtc 444, sinus rhythm with q wave V1 and V2, with no significant ST segment changes, negative T wave V1 to V4.   Patient was placed on IV heparin and transported from [facility] to [facility].  Further work up with preserved LV systolic function with severe aortic insuficency and 6,1 ascending aortic aneurysm.  Patient not candidate for surgical intervention.  Palliative care was consulted, patient and family made decision for further care under home  hospice.

## 2023-06-13 NOTE — Progress Notes (Signed)
 [facility] 3E30 [facility] Liaison Note  Received request from [facility] for hospice services at home after discharge. Spoke with family to initiate education related to hospice philosophy, services and team approach to care. Per discussion, the plan is for discharge home tomorrow.  DME needs discussed. Patient has the following equipment in the home: wheelchair, walker, cane Family requests the following equipment for delivery: hospital bed, over bed table  Please send signed and completed DNR home with patient/family. Please provide prescriptions at discharge as needed to ensure ongoing symptom management.  [facility] information and contact numbers given. Please call with any concerns.  Thank you for the opportunity to participate in this patient's care.   Henderson Newcomer, LPN [facility] Liaison [phone]

## 2023-06-13 NOTE — TOC Progression Note (Signed)
 Transition of Care [facility]) - Progression Note    Patient Details  Name: AELLA RONDA MRN: [id number] Date of Birth: 04/28/1930  [facility]) CM/SW Contact  Leone Haven, RN Phone Number: 06/13/2023, 10:50 AM  Clinical Narrative:    The DME is scheduled to be delivered by 1 pm per [facility] with [facility]. Need to await DME delivery.   Expected Discharge Plan: Home w Home Health Services Barriers to Discharge: Continued Medical Work up  Expected Discharge Plan and Services   Discharge Planning Services: CM Consult Post Acute Care Choice: NA Living arrangements for the past 2 months: Single Family Home                 DME Arranged: N/A DME Agency: NA       HH Arranged: NA           Social Determinants of Health (SDOH) Interventions SDOH Screenings   Food Insecurity: No Food Insecurity (06/09/2023)  Housing: Low Risk  (06/09/2023)  Transportation Needs: No Transportation Needs (06/09/2023)  Utilities: Not At Risk (06/09/2023)  Alcohol Screen: Low Risk  (01/12/2020)  Depression (PHQ2-9): Low Risk  (08/26/2020)  Social Connections: Moderately Isolated (06/12/2023)  Tobacco Use: Low Risk  (05/14/2023)    Readmission Risk Interventions    06/11/2023    4:22 PM  Readmission Risk Prevention Plan  Transportation Screening Complete  PCP or Specialist Appt within 5-7 Days Complete  Medication Review (RN CM) Complete

## 2023-06-13 NOTE — Discharge Summary (Signed)
 Physician Discharge Summary   Patient: [name] MRN: [id number] DOB: [birth year]  Admit date:     06/09/2023  Discharge date: 06/13/23  Discharge Physician: York Ram Aquilla Voiles   PCP: Thana Ates, MD   Recommendations at discharge:    Patient is being discharge home under care of hospice.  Labetalol has been changed to metoprolol and dose of amlodipine has been increased.  Diuretic therapy with torsemide.  Follow up with Dr Margaretann Loveless in 7 to 10 days.  Follow up with Hospice at home.   Discharge Diagnoses: Principal Problem:   NSTEMI (non-ST elevated myocardial infarction) (HCC) Active Problems:   Essential hypertension   Chronic kidney disease   GERD (gastroesophageal reflux disease)   Mixed hyperlipidemia   Ascending aortic aneurysm (HCC)  Resolved Problems:   * No resolved hospital problems. [facility] Course: [name] was admitted to the hospital with the working diagnosis of non ST elevation myocardial infarction.   89 yo female with the past medical history of CKD, hypertension, hyperlipidemia and CVA who presented with chest pain. Reported recurrent precordial chest pain, pressure in nature, occurring at rest. EMS was called and patient was transported to the ED. On her initial physical examination her blood pressure was 186/112, HR 95, RR 28 and 02 saturation 99%. Lungs with no wheezing or rhonchi, heart with S1 and S2 present and regular, abdomen with no distention and positive lower extremity edema.   Na 144, K 4.6 Cl 114 bicarbonate 19, glucose 175, bun 33 cr 2,28  High sensitive troponin 1,233- 1,985- 4,687  Wbc 9,1 hgb 9,4 plt 227  Sars covid 19 negative  Influenza negative   Chest radiograph with cardiomegaly, with bilateral hilar vascular congestion, bilateral central interstitial infiltrates and small bilateral pleural effusions.   EKG 93 bpm, normal axis, normal intervals, qtc 444, sinus rhythm with q wave V1 and V2, with no significant ST  segment changes, negative T wave V1 to V4.   Patient was placed on IV heparin and transported from [facility] to [facility].  Further work up with preserved LV systolic function with severe aortic insuficency and 6,1 ascending aortic aneurysm.  Patient not candidate for surgical intervention.  Palliative care was consulted, patient and family made decision for further care under home  hospice.     Assessment and Plan: * NSTEMI (non-ST elevated myocardial infarction) (HCC) Patient was medically treated with IV heparin for 48 hrs.  Peak high sensitive troponin 5,940  Echocardiogram with preserved LV systolic function with no wall motion abnormalities.  Considering comorbid conditions, including renal failure, not candidate for invasive interventions.  Plan to continue with Aggrenox and statin.  B blocker change to metoprolol succinate.    Acute on chronic diastolic CHF (congestive heart failure) (HCC) Echocardiogram with preserved LV systolic function with EF 50 to 55%, global hypokinesis, RV systolic function preserved, LA with moderate dilatation, mild to moderate mitral valve regurgitation, TR moderate to severe, severe aortic valve regurgitation,   Patient not candidate for advance therapies due to comorbid conditions.  Plan to continue blood pressure control and palliative diuresis with furosemide.  Labetalol changed to metoprolol succinate.  Follow up with hospice team as outpatient.   Ascending aortic aneurysm (HCC) 61 mm in size, patient is not candidate for surgical intervention.   Chronic kidney disease AKI on CKD stage 4.   At the time of her discharge her serum cr is 2,78 with K at 5,4 and serum bicarbonate at 18  Na 144  Plan to continue diuresis for volume overload with furosemide. Further dose adjustments per hospice team.    Anemia of chronic renal disease with iron deficiency, discharge hgb is 9.6  Continue oral iron supplementation.   Essential hypertension Continue blood  pressure control with amlodipine.   GERD (gastroesophageal reflux disease) Continue omeprazole.   Mixed hyperlipidemia Continue statin therapy.         Consultants: Cardiology, CT surgery  Procedures performed: none   Disposition: Home Diet recommendation:  Cardiac diet DISCHARGE MEDICATION: Allergies as of 06/13/2023       Reactions   Ciprofloxacin    Acute renal failure   Diphenhydramine Hcl    REACTION: UNKNOWN REACTION   Sulfonamide Derivatives Rash        Medication List     STOP taking these medications    labetalol 100 MG tablet Commonly known as: NORMODYNE   OMEGA-3 FISH OIL PO   sodium bicarbonate 650 MG tablet       TAKE these medications    acetaminophen 650 MG CR tablet Commonly known as: TYLENOL Take 650 mg by mouth every 8 (eight) hours as needed for pain.   amLODipine 5 MG tablet Commonly known as: NORVASC Take 1 tablet (5 mg total) by mouth daily. Start taking on: June 14, 2023 What changed:  medication strength how much to take   atorvastatin 20 MG tablet Commonly known as: LIPITOR Take 1 tablet (20 mg total) by mouth daily.   calcitRIOL 0.25 MCG capsule Commonly known as: ROCALTROL Take 0.25 mcg by mouth daily.   cetirizine 10 MG tablet Commonly known as: ZYRTEC Take 10 mg by mouth daily. As needed   Cranberry Plus Vitamin C 4200-20-3 MG-UNIT Caps Generic drug: Cranberry-Vitamin C-Vitamin E Take by mouth.   dipyridamole-aspirin 200-25 MG 12hr capsule Commonly known as: AGGRENOX Take 1 capsule by mouth 2 (two) times daily.   ferrous sulfate 325 (65 FE) MG EC tablet Take 325 mg by mouth daily with breakfast.   furosemide 20 MG tablet Commonly known as: LASIX Take 3 tablets (60 mg total) by mouth daily.   metoprolol succinate 25 MG 24 hr tablet Commonly known as: TOPROL-XL Take 0.5 tablets (12.5 mg total) by mouth at bedtime.   omeprazole 20 MG capsule Commonly known as: PRILOSEC TAKE 1 CAPSULE(20 MG) BY MOUTH  DAILY   traMADol 50 MG tablet Commonly known as: ULTRAM Take 1 tablet (50 mg total) by mouth daily as needed.        Follow-up Information     Thana Ates, MD. Call on 06/19/2023.   Specialty: Internal Medicine Why: 11 for hospital follow up apt Contact information: [address] [place] [address] [phone]         [facility] Follow up.   Specialty: Hospice and Palliative Medicine Why: Home Hospice Contact information: [address] [place] [address] [phone]               Discharge Exam: Filed Weights   06/10/23 0521 06/11/23 0400 06/12/23 0555  Weight: 62.9 kg 64.7 kg 67.3 kg   BP 119/65 (BP Location: Left Arm)   Pulse 80   Temp 98.3 F (36.8 C) (Oral)   Resp (!) 22   Ht 5\' 1"  (1.549 m)   Wt 67.3 kg   SpO2 100%   BMI 28.03 kg/m   Patient is feeling fatigued and deconditioned. Her family is at the bedside.   Neurology awake and alert, deconditioned ENT with  mild pallor with no icterus Cardiovascular with S1 and S2 present and regular, positive systolic murmur at the right lower sternal border and diastolic murmur at the base Positive JVD  Positive lower extremity edema Respiratory with rales bilaterally with no wheezing or rhonchi Abdomen with no distention   Condition at discharge: stable  The results of significant diagnostics from this hospitalization (including imaging, microbiology, ancillary and laboratory) are listed below for reference.   Imaging Studies: ECHOCARDIOGRAM COMPLETE Result Date: 06/10/2023    ECHOCARDIOGRAM REPORT   Patient Name:   RENEA SCHOONMAKER Date of Exam: 06/10/2023 Medical Rec #:  [id number]         Height:       61.0 in Accession #:    [id number]        Weight:       138.7 lb Date of Birth:  1930-06-27          BSA:          1.617 m Patient Age:    89 years          BP:           145/94 mmHg Patient Gender: F                 HR:           85 bpm. Exam Location:   Inpatient Procedure: 2D Echo, Cardiac Doppler and Color Doppler (Both Spectral and Color            Flow Doppler were utilized during procedure). Indications:    NSTEMI I21.4  History:        Patient has no prior history of Echocardiogram examinations.                 Previous Myocardial Infarction, CKD and Stroke; Risk                 Factors:Hypertension and Dyslipidemia.  Sonographer:    Lucendia Herrlich RCS Referring Phys: [id number] DAWOOD S ELGERGAWY IMPRESSIONS  1. Aneurysm of the ascending aorta, measuring 61 mm.  2. Left ventricular ejection fraction, by estimation, is 50 to 55%. The left ventricle has low normal function. The left ventricle demonstrates global hypokinesis. Left ventricular diastolic parameters are indeterminate.  3. Right ventricular systolic function is normal. The right ventricular size is normal. There is moderately elevated pulmonary artery systolic pressure.  4. Left atrial size was mildly dilated.  5. The mitral valve is normal in structure. Mild to moderate mitral valve regurgitation. No evidence of mitral stenosis.  6. Tricuspid valve regurgitation is moderate to severe.  7. The aortic valve is normal in structure. Aortic valve regurgitation is severe. No aortic stenosis is present.  8. The inferior vena cava is dilated in size with <50% respiratory variability, suggesting right atrial pressure of 15 mmHg. FINDINGS  Left Ventricle: Left ventricular ejection fraction, by estimation, is 50 to 55%. The left ventricle has low normal function. The left ventricle demonstrates global hypokinesis. The left ventricular internal cavity size was normal in size. There is no left ventricular hypertrophy. Left ventricular diastolic parameters are indeterminate. Right Ventricle: The right ventricular size is normal. No increase in right ventricular wall thickness. Right ventricular systolic function is normal. There is moderately elevated pulmonary artery systolic pressure. The tricuspid regurgitant  velocity is 3.26 m/s, and with an assumed right atrial pressure of 15 mmHg, the estimated right ventricular systolic pressure is 57.5 mmHg. Left Atrium: Left atrial size was mildly dilated. Right  Atrium: Right atrial size was normal in size. Pericardium: There is no evidence of pericardial effusion. Mitral Valve: The mitral valve is normal in structure. Mild to moderate mitral valve regurgitation. No evidence of mitral valve stenosis. Tricuspid Valve: The tricuspid valve is normal in structure. Tricuspid valve regurgitation is moderate to severe. No evidence of tricuspid stenosis. Aortic Valve: The aortic valve is normal in structure. Aortic valve regurgitation is severe. Aortic regurgitation PHT measures 199 msec. No aortic stenosis is present. Aortic valve peak gradient measures 8.0 mmHg. Pulmonic Valve: The pulmonic valve was normal in structure. Pulmonic valve regurgitation is mild to moderate. No evidence of pulmonic stenosis. Aorta: There is an aneurysm involving the ascending aorta measuring 61 mm. Venous: The inferior vena cava is dilated in size with less than 50% respiratory variability, suggesting right atrial pressure of 15 mmHg. IAS/Shunts: No atrial level shunt detected by color flow Doppler.  LEFT VENTRICLE PLAX 2D LVIDd:         4.80 cm   Diastology LVIDs:         3.50 cm   LV e' medial:    3.68 cm/s LV PW:         0.90 cm   LV E/e' medial:  20.1 LV IVS:        0.60 cm   LV e' lateral:   6.22 cm/s LVOT diam:     1.90 cm   LV E/e' lateral: 11.9 LV SV:         45 LV SV Index:   28 LVOT Area:     2.84 cm                           3D Volume EF:                          3D EF:        52 %                          LV EDV:       147 ml                          LV ESV:       71 ml                          LV SV:        76 ml RIGHT VENTRICLE            IVC RV S prime:     9.20 cm/s  IVC diam: 2.70 cm TAPSE (M-mode): 1.6 cm LEFT ATRIUM             Index        RIGHT ATRIUM           Index LA diam:        3.80  cm 2.35 cm/m   RA Area:     12.30 cm LA Vol (A2C):   47.4 ml 29.32 ml/m  RA Volume:   26.80 ml  16.58 ml/m LA Vol (A4C):   76.6 ml 47.39 ml/m LA Biplane Vol: 64.2 ml 39.72 ml/m  AORTIC VALVE AV Area (Vmax): 1.75 cm AV Vmax:        141.50 cm/s AV Peak Grad:   8.0 mmHg LVOT Vmax:  87.17 cm/s LVOT Vmean:     55.900 cm/s LVOT VTI:       0.158 m AI PHT:         199 msec  AORTA Ao Root diam: 3.40 cm Ao Asc diam:  6.10 cm MITRAL VALVE                TRICUSPID VALVE MV Area (PHT): 5.27 cm     TR Peak grad:   42.5 mmHg MV Decel Time: 144 msec     TR Vmax:        326.00 cm/s MR Peak grad: 93.7 mmHg MR Vmax:      484.00 cm/s   SHUNTS MV E velocity: 73.90 cm/s   Systemic VTI:  0.16 m MV A velocity: 107.00 cm/s  Systemic Diam: 1.90 cm MV E/A ratio:  0.69 Kardie Tobb DO Electronically signed by Thomasene Ripple DO Signature Date/Time: 06/10/2023/5:12:56 PM    Final    DG Chest Port 1 View Result Date: 06/09/2023 CLINICAL DATA:  Chest pain beginning last night, worse this morning. EXAM: PORTABLE CHEST 1 VIEW COMPARISON:  07/29/2021 and older studies. FINDINGS: Cardiac silhouette normal in size.  No mediastinal or hilar masses. Bilateral irregular interstitial thickening. Patchy areas of hazy airspace opacity in the perihilar and lower lungs. Small pleural effusions. No pneumothorax. Skeletal structures are demineralized, grossly intact. IMPRESSION: Findings consistent with CHF. Electronically Signed   By: Amie Portland [name]   On: 06/09/2023 14:51    Microbiology: Results for orders placed or performed during the hospital encounter of 06/09/23  Resp panel by RT-PCR (RSV, Flu A&B, Covid) Anterior Nasal Swab     Status: None   Collection Time: 06/09/23  4:58 PM   Specimen: Anterior Nasal Swab  Result Value Ref Range Status   SARS Coronavirus 2 by RT PCR NEGATIVE NEGATIVE Final    Comment: (NOTE) SARS-CoV-2 target nucleic acids are NOT DETECTED.  The SARS-CoV-2 RNA is generally detectable in upper  respiratory specimens during the acute phase of infection. The lowest concentration of SARS-CoV-2 viral copies this assay can detect is 138 copies/mL. A negative result does not preclude SARS-Cov-2 infection and should not be used as the sole basis for treatment or other patient management decisions. A negative result may occur with  improper specimen collection/handling, submission of specimen other than nasopharyngeal swab, presence of viral mutation(s) within the areas targeted by this assay, and inadequate number of viral copies(<138 copies/mL). A negative result must be combined with clinical observations, patient history, and epidemiological information. The expected result is Negative.  Fact Sheet for Patients:  BloggerCourse.com  Fact Sheet for Healthcare Providers:  SeriousBroker.it  This test is no t yet approved or cleared by the [place] FDA and  has been authorized for detection and/or diagnosis of SARS-CoV-2 by FDA under an Emergency Use Authorization (EUA). This EUA will remain  in effect (meaning this test can be used) for the duration of the COVID-19 declaration under Section 564(b)(1) of the Act, 21 U.S.C.section 360bbb-3(b)(1), unless the authorization is terminated  or revoked sooner.       Influenza A by PCR NEGATIVE NEGATIVE Final   Influenza B by PCR NEGATIVE NEGATIVE Final    Comment: (NOTE) The Xpert Xpress SARS-CoV-2/FLU/RSV plus assay is intended as an aid in the diagnosis of influenza from Nasopharyngeal swab specimens and should not be used as a sole basis for treatment. Nasal washings and aspirates are unacceptable for Xpert Xpress SARS-CoV-2/FLU/RSV testing.  Fact Sheet for Patients:  BloggerCourse.com  Fact Sheet for Healthcare Providers: SeriousBroker.it  This test is not yet approved or cleared by the [place] FDA and has been  authorized for detection and/or diagnosis of SARS-CoV-2 by FDA under an Emergency Use Authorization (EUA). This EUA will remain in effect (meaning this test can be used) for the duration of the COVID-19 declaration under Section 564(b)(1) of the Act, 21 U.S.C. section 360bbb-3(b)(1), unless the authorization is terminated or revoked.     Resp Syncytial Virus by PCR NEGATIVE NEGATIVE Final    Comment: (NOTE) Fact Sheet for Patients: BloggerCourse.com  Fact Sheet for Healthcare Providers: SeriousBroker.it  This test is not yet approved or cleared by the [place] FDA and has been authorized for detection and/or diagnosis of SARS-CoV-2 by FDA under an Emergency Use Authorization (EUA). This EUA will remain in effect (meaning this test can be used) for the duration of the COVID-19 declaration under Section 564(b)(1) of the Act, 21 U.S.C. section 360bbb-3(b)(1), unless the authorization is terminated or revoked.  Performed at [facility], [address], [place], [place] [address]     Labs: CBC: Recent Labs  Lab 06/10/23 0216 06/10/23 0826 06/11/23 0245 06/12/23 0251 06/13/23 0227  WBC 8.5 9.4 10.0 10.0 9.9  HGB 9.3* 9.6* 8.9* 8.5* 8.8*  HCT 30.6* 31.1* 28.7* 27.6* 28.7*  MCV 104.4* 104.7* 103.2* 103.4* 103.2*  PLT 210 209 198 128* 152   Basic Metabolic Panel: Recent Labs  Lab 06/10/23 0216 06/10/23 0826 06/11/23 0245 06/12/23 0251 06/13/23 0227  NA 144 144 142 139 140  K 6.1* 5.4* 5.3* 5.1 4.8  CL 115* 113* 113* 111 110  CO2 17* 18* 21* 19* 22  GLUCOSE 129* 112* 103* 101* 116*  BUN 36* 38* 45* 56* 62*  CREATININE 2.51* 2.78* 3.08* 3.42* 3.65*  CALCIUM 8.5* 8.6* 8.1* 7.8* 8.0*   Liver Function Tests: No results for input(s): "AST", "ALT", "ALKPHOS", "BILITOT", "PROT", "ALBUMIN" in the last 168 hours. CBG: No results for input(s): "GLUCAP" in the last 168 hours.  Discharge time spent: greater than 30  minutes.  Signed: Coralie Keens, MD [facility] 06/13/2023

## 2023-06-13 NOTE — Assessment & Plan Note (Signed)
Continue blood pressure control with amlodipine.  

## 2023-06-13 NOTE — Assessment & Plan Note (Addendum)
 Patient was medically treated with IV heparin for 48 hrs.  Peak high sensitive troponin 5,940  Echocardiogram with preserved LV systolic function with no wall motion abnormalities.  Considering comorbid conditions, including renal failure, not candidate for invasive interventions.  Plan to continue with Aggrenox and statin.  B blocker change to metoprolol succinate.

## 2023-06-13 NOTE — Assessment & Plan Note (Signed)
 Continue statin therapy.

## 2023-06-13 NOTE — Assessment & Plan Note (Addendum)
 AKI on CKD stage 4.   At the time of her discharge her serum cr is 2,78 with K at 5,4 and serum bicarbonate at 18  Na 144  Plan to continue diuresis for volume overload with furosemide. Further dose adjustments per hospice team.    Anemia of chronic renal disease with iron deficiency, discharge hgb is 9.6  Continue oral iron supplementation.

## 2023-08-13 ENCOUNTER — Ambulatory Visit: Payer: Medicare HMO | Admitting: Orthopedic Surgery

## 2023-08-26 DEATH — deceased

## 2023-10-04 ENCOUNTER — Other Ambulatory Visit (HOSPITAL_COMMUNITY): Payer: Self-pay
